# Patient Record
Sex: Female | Born: 1935 | Race: White | Hispanic: No | Smoking: Never smoker
Health system: Southern US, Community
[De-identification: ages and names within clinical notes are randomized; demographics above are authoritative.]

## PROBLEM LIST (undated history)

## (undated) DIAGNOSIS — A799 Rickettsiosis, unspecified: Secondary | ICD-10-CM

## (undated) DIAGNOSIS — M8580 Other specified disorders of bone density and structure, unspecified site: Secondary | ICD-10-CM

## (undated) DIAGNOSIS — E785 Hyperlipidemia, unspecified: Secondary | ICD-10-CM

## (undated) DIAGNOSIS — K219 Gastro-esophageal reflux disease without esophagitis: Secondary | ICD-10-CM

## (undated) DIAGNOSIS — I10 Essential (primary) hypertension: Secondary | ICD-10-CM

## (undated) DIAGNOSIS — C50919 Malignant neoplasm of unspecified site of unspecified female breast: Secondary | ICD-10-CM

## (undated) DIAGNOSIS — J45909 Unspecified asthma, uncomplicated: Secondary | ICD-10-CM

## (undated) DIAGNOSIS — C801 Malignant (primary) neoplasm, unspecified: Secondary | ICD-10-CM

## (undated) DIAGNOSIS — I480 Paroxysmal atrial fibrillation: Secondary | ICD-10-CM

## (undated) DIAGNOSIS — Z9289 Personal history of other medical treatment: Secondary | ICD-10-CM

## (undated) HISTORY — DX: Personal history of other medical treatment: Z92.89

## (undated) HISTORY — DX: Other specified disorders of bone density and structure, unspecified site: M85.80

## (undated) HISTORY — DX: Gastro-esophageal reflux disease without esophagitis: K21.9

## (undated) HISTORY — PX: BREAST BIOPSY: SHX20

## (undated) HISTORY — DX: Malignant (primary) neoplasm, unspecified: C80.1

## (undated) HISTORY — DX: Rickettsiosis, unspecified: A79.9

## (undated) HISTORY — DX: Unspecified asthma, uncomplicated: J45.909

## (undated) HISTORY — DX: Paroxysmal atrial fibrillation: I48.0

## (undated) HISTORY — DX: Hyperlipidemia, unspecified: E78.5

## (undated) HISTORY — DX: Malignant neoplasm of unspecified site of unspecified female breast: C50.919

## (undated) HISTORY — DX: Essential (primary) hypertension: I10

---

## 1978-01-06 HISTORY — PX: ABDOMINAL HYSTERECTOMY: SHX81

## 1981-01-06 HISTORY — PX: BLADDER SUSPENSION: SHX72

## 2004-01-11 ENCOUNTER — Ambulatory Visit: Payer: Self-pay | Admitting: Family Medicine

## 2005-01-13 ENCOUNTER — Ambulatory Visit: Payer: Self-pay | Admitting: Family Medicine

## 2006-01-15 ENCOUNTER — Ambulatory Visit: Payer: Self-pay | Admitting: Family Medicine

## 2007-01-19 ENCOUNTER — Ambulatory Visit: Payer: Self-pay | Admitting: Family Medicine

## 2008-01-07 DIAGNOSIS — C50919 Malignant neoplasm of unspecified site of unspecified female breast: Secondary | ICD-10-CM

## 2008-01-07 HISTORY — PX: MASTECTOMY: SHX3

## 2008-01-07 HISTORY — PX: BREAST SURGERY: SHX581

## 2008-01-07 HISTORY — DX: Malignant neoplasm of unspecified site of unspecified female breast: C50.919

## 2008-01-21 ENCOUNTER — Ambulatory Visit: Payer: Self-pay | Admitting: Family Medicine

## 2008-01-26 ENCOUNTER — Ambulatory Visit: Payer: Self-pay | Admitting: Family Medicine

## 2008-02-18 ENCOUNTER — Ambulatory Visit: Payer: Self-pay | Admitting: Surgery

## 2008-02-25 ENCOUNTER — Ambulatory Visit: Payer: Self-pay | Admitting: Surgery

## 2008-03-06 ENCOUNTER — Ambulatory Visit: Payer: Self-pay | Admitting: Internal Medicine

## 2008-03-08 ENCOUNTER — Ambulatory Visit: Payer: Self-pay | Admitting: Internal Medicine

## 2008-04-05 ENCOUNTER — Inpatient Hospital Stay: Payer: Self-pay | Admitting: Surgery

## 2008-04-06 ENCOUNTER — Ambulatory Visit: Payer: Self-pay | Admitting: Internal Medicine

## 2008-04-21 ENCOUNTER — Ambulatory Visit: Payer: Self-pay | Admitting: Internal Medicine

## 2008-05-06 ENCOUNTER — Ambulatory Visit: Payer: Self-pay | Admitting: Internal Medicine

## 2008-07-06 ENCOUNTER — Ambulatory Visit: Payer: Self-pay | Admitting: Internal Medicine

## 2008-07-21 ENCOUNTER — Ambulatory Visit: Payer: Self-pay | Admitting: Internal Medicine

## 2008-08-06 ENCOUNTER — Ambulatory Visit: Payer: Self-pay | Admitting: Internal Medicine

## 2008-10-06 ENCOUNTER — Ambulatory Visit: Payer: Self-pay | Admitting: Internal Medicine

## 2008-10-20 ENCOUNTER — Ambulatory Visit: Payer: Self-pay | Admitting: Internal Medicine

## 2008-11-06 ENCOUNTER — Ambulatory Visit: Payer: Self-pay | Admitting: Internal Medicine

## 2009-01-22 ENCOUNTER — Ambulatory Visit: Payer: Self-pay | Admitting: Family Medicine

## 2009-02-06 ENCOUNTER — Ambulatory Visit: Payer: Self-pay | Admitting: Internal Medicine

## 2009-02-20 ENCOUNTER — Ambulatory Visit: Payer: Self-pay | Admitting: Internal Medicine

## 2009-03-06 ENCOUNTER — Ambulatory Visit: Payer: Self-pay | Admitting: Internal Medicine

## 2009-07-06 ENCOUNTER — Ambulatory Visit: Payer: Self-pay | Admitting: Internal Medicine

## 2009-07-13 ENCOUNTER — Ambulatory Visit: Payer: Self-pay | Admitting: Internal Medicine

## 2009-08-05 ENCOUNTER — Observation Stay: Payer: Self-pay | Admitting: Internal Medicine

## 2009-08-06 ENCOUNTER — Ambulatory Visit: Payer: Self-pay | Admitting: Internal Medicine

## 2009-11-16 ENCOUNTER — Ambulatory Visit: Payer: Self-pay | Admitting: Internal Medicine

## 2009-12-06 ENCOUNTER — Ambulatory Visit: Payer: Self-pay | Admitting: Internal Medicine

## 2010-01-25 ENCOUNTER — Ambulatory Visit: Payer: Self-pay | Admitting: Internal Medicine

## 2010-03-22 ENCOUNTER — Ambulatory Visit: Payer: Self-pay | Admitting: Internal Medicine

## 2010-04-07 ENCOUNTER — Ambulatory Visit: Payer: Self-pay | Admitting: Internal Medicine

## 2010-09-23 ENCOUNTER — Ambulatory Visit: Payer: Self-pay | Admitting: Internal Medicine

## 2010-10-07 ENCOUNTER — Ambulatory Visit: Payer: Self-pay | Admitting: Internal Medicine

## 2010-12-05 HISTORY — PX: OTHER SURGICAL HISTORY: SHX169

## 2011-01-07 HISTORY — PX: COLONOSCOPY: SHX174

## 2011-01-27 ENCOUNTER — Ambulatory Visit: Payer: Self-pay | Admitting: Internal Medicine

## 2011-03-24 ENCOUNTER — Ambulatory Visit: Payer: Self-pay | Admitting: Internal Medicine

## 2011-03-24 LAB — HEPATIC FUNCTION PANEL A (ARMC)
Albumin: 4.2 g/dL (ref 3.4–5.0)
Alkaline Phosphatase: 106 U/L (ref 50–136)
Bilirubin, Direct: 0.1 mg/dL (ref 0.00–0.20)
Bilirubin,Total: 0.6 mg/dL (ref 0.2–1.0)
SGPT (ALT): 53 U/L
Total Protein: 8.3 g/dL — ABNORMAL HIGH (ref 6.4–8.2)

## 2011-03-24 LAB — CBC CANCER CENTER
Basophil #: 0 x10 3/mm (ref 0.0–0.1)
Eosinophil #: 0.2 x10 3/mm (ref 0.0–0.7)
Lymphocyte #: 2.3 x10 3/mm (ref 1.0–3.6)
Lymphocyte %: 25.2 %
MCH: 30.8 pg (ref 26.0–34.0)
MCHC: 34.6 g/dL (ref 32.0–36.0)
MCV: 89 fL (ref 80–100)
Monocyte #: 0.7 x10 3/mm (ref 0.0–0.7)
Monocyte %: 8.1 %
Neutrophil %: 64 %
Platelet: 315 x10 3/mm (ref 150–440)
RDW: 13.5 % (ref 11.5–14.5)
WBC: 9.2 x10 3/mm (ref 3.6–11.0)

## 2011-03-24 LAB — CREATININE, SERUM: Creatinine: 1.04 mg/dL (ref 0.60–1.30)

## 2011-04-07 ENCOUNTER — Ambulatory Visit: Payer: Self-pay | Admitting: Internal Medicine

## 2011-06-12 ENCOUNTER — Ambulatory Visit: Payer: Self-pay | Admitting: Gastroenterology

## 2011-06-12 DIAGNOSIS — K219 Gastro-esophageal reflux disease without esophagitis: Secondary | ICD-10-CM

## 2011-06-12 HISTORY — DX: Gastro-esophageal reflux disease without esophagitis: K21.9

## 2011-11-14 ENCOUNTER — Ambulatory Visit: Payer: Self-pay | Admitting: Ophthalmology

## 2011-11-14 LAB — CREATININE, SERUM
Creatinine: 0.76 mg/dL (ref 0.60–1.30)
EGFR (Non-African Amer.): >60

## 2012-01-07 DIAGNOSIS — Z9289 Personal history of other medical treatment: Secondary | ICD-10-CM

## 2012-01-07 HISTORY — DX: Personal history of other medical treatment: Z92.89

## 2012-01-29 ENCOUNTER — Ambulatory Visit: Payer: Self-pay | Admitting: Internal Medicine

## 2012-03-06 ENCOUNTER — Ambulatory Visit: Payer: Self-pay | Admitting: Internal Medicine

## 2012-03-30 LAB — CBC CANCER CENTER
Basophil %: 1 %
Eosinophil #: 0.2 x10 3/mm (ref 0.0–0.7)
HCT: 43.6 % (ref 35.0–47.0)
HGB: 15.2 g/dL (ref 12.0–16.0)
Lymphocyte #: 1.8 x10 3/mm (ref 1.0–3.6)
MCH: 30.7 pg (ref 26.0–34.0)
MCHC: 34.9 g/dL (ref 32.0–36.0)
Monocyte #: 1 x10 3/mm — ABNORMAL HIGH (ref 0.2–0.9)
Monocyte %: 9.9 %
Neutrophil #: 6.6 x10 3/mm — ABNORMAL HIGH (ref 1.4–6.5)
Platelet: 309 x10 3/mm (ref 150–440)
RDW: 13.5 % (ref 11.5–14.5)

## 2012-03-30 LAB — HEPATIC FUNCTION PANEL A (ARMC)
Albumin: 4.2 g/dL (ref 3.4–5.0)
Alkaline Phosphatase: 113 U/L (ref 50–136)
Bilirubin, Direct: 0.2 mg/dL (ref 0.00–0.20)
Bilirubin,Total: 0.6 mg/dL (ref 0.2–1.0)
SGPT (ALT): 45 U/L (ref 12–78)

## 2012-03-30 LAB — CREATININE, SERUM
Creatinine: 0.85 mg/dL (ref 0.60–1.30)
EGFR (Non-African Amer.): >60

## 2012-04-06 ENCOUNTER — Ambulatory Visit: Payer: Self-pay | Admitting: Internal Medicine

## 2013-01-31 ENCOUNTER — Ambulatory Visit: Payer: Self-pay | Admitting: Internal Medicine

## 2013-03-29 ENCOUNTER — Ambulatory Visit: Payer: Self-pay | Admitting: Internal Medicine

## 2013-03-30 LAB — CREATININE, SERUM
CREATININE: 0.84 mg/dL (ref 0.60–1.30)
EGFR (Non-African Amer.): >60

## 2013-03-30 LAB — CBC CANCER CENTER
Basophil #: 0.1 x10 3/mm (ref 0.0–0.1)
Basophil %: 0.8 %
EOS ABS: 0.3 x10 3/mm (ref 0.0–0.7)
Eosinophil %: 3.3 %
HCT: 43.5 % (ref 35.0–47.0)
HGB: 14.5 g/dL (ref 12.0–16.0)
LYMPHS ABS: 1.9 x10 3/mm (ref 1.0–3.6)
Lymphocyte %: 20.4 %
MCH: 29.5 pg (ref 26.0–34.0)
MCHC: 33.3 g/dL (ref 32.0–36.0)
MCV: 89 fL (ref 80–100)
Monocyte #: 0.9 x10 3/mm (ref 0.2–0.9)
Monocyte %: 9.7 %
Neutrophil #: 6.2 x10 3/mm (ref 1.4–6.5)
Neutrophil %: 65.8 %
Platelet: 293 x10 3/mm (ref 150–440)
RBC: 4.91 10*6/uL (ref 3.80–5.20)
RDW: 13.4 % (ref 11.5–14.5)
WBC: 9.4 x10 3/mm (ref 3.6–11.0)

## 2013-03-30 LAB — HEPATIC FUNCTION PANEL A (ARMC)
ALBUMIN: 4.1 g/dL (ref 3.4–5.0)
Alkaline Phosphatase: 95 U/L
BILIRUBIN DIRECT: 0.1 mg/dL (ref 0.00–0.20)
BILIRUBIN TOTAL: 0.6 mg/dL (ref 0.2–1.0)
SGOT(AST): 29 U/L (ref 15–37)
SGPT (ALT): 28 U/L (ref 12–78)
TOTAL PROTEIN: 8.1 g/dL (ref 6.4–8.2)

## 2013-04-06 ENCOUNTER — Ambulatory Visit: Payer: Self-pay | Admitting: Internal Medicine

## 2013-04-06 ENCOUNTER — Encounter: Payer: Medicare Other | Admitting: Cardiothoracic Surgery

## 2013-04-14 ENCOUNTER — Ambulatory Visit: Payer: Self-pay | Admitting: Family Medicine

## 2013-05-09 ENCOUNTER — Encounter: Payer: Self-pay | Admitting: *Deleted

## 2013-05-09 DIAGNOSIS — K219 Gastro-esophageal reflux disease without esophagitis: Secondary | ICD-10-CM | POA: Insufficient documentation

## 2013-05-09 DIAGNOSIS — M8580 Other specified disorders of bone density and structure, unspecified site: Secondary | ICD-10-CM | POA: Insufficient documentation

## 2013-05-09 DIAGNOSIS — A799 Rickettsiosis, unspecified: Secondary | ICD-10-CM | POA: Insufficient documentation

## 2013-05-09 DIAGNOSIS — C801 Malignant (primary) neoplasm, unspecified: Secondary | ICD-10-CM | POA: Insufficient documentation

## 2013-05-09 DIAGNOSIS — E785 Hyperlipidemia, unspecified: Secondary | ICD-10-CM | POA: Insufficient documentation

## 2013-05-09 DIAGNOSIS — I10 Essential (primary) hypertension: Secondary | ICD-10-CM | POA: Insufficient documentation

## 2013-05-09 DIAGNOSIS — C50919 Malignant neoplasm of unspecified site of unspecified female breast: Secondary | ICD-10-CM | POA: Insufficient documentation

## 2013-05-09 DIAGNOSIS — I480 Paroxysmal atrial fibrillation: Secondary | ICD-10-CM | POA: Insufficient documentation

## 2013-05-09 DIAGNOSIS — J45909 Unspecified asthma, uncomplicated: Secondary | ICD-10-CM | POA: Insufficient documentation

## 2013-05-10 ENCOUNTER — Institutional Professional Consult (permissible substitution) (INDEPENDENT_AMBULATORY_CARE_PROVIDER_SITE_OTHER): Payer: Medicare Other | Admitting: Thoracic Surgery (Cardiothoracic Vascular Surgery)

## 2013-05-10 ENCOUNTER — Encounter: Payer: Self-pay | Admitting: Thoracic Surgery (Cardiothoracic Vascular Surgery)

## 2013-05-10 VITALS — BP 143/88 | HR 64 | Resp 16 | Ht 61.0 in | Wt 115.0 lb

## 2013-05-10 DIAGNOSIS — I340 Nonrheumatic mitral (valve) insufficiency: Secondary | ICD-10-CM

## 2013-05-10 DIAGNOSIS — I059 Rheumatic mitral valve disease, unspecified: Secondary | ICD-10-CM

## 2013-05-10 DIAGNOSIS — I071 Rheumatic tricuspid insufficiency: Secondary | ICD-10-CM | POA: Insufficient documentation

## 2013-05-10 DIAGNOSIS — I079 Rheumatic tricuspid valve disease, unspecified: Secondary | ICD-10-CM

## 2013-05-10 NOTE — Progress Notes (Signed)
PCP is [name], MD Referring Provider is [name], MD CARDIOLOGIST: [name], MD  Chief Complaint  Patient presents with  . Mitral Regurgitation    Surgical eval, ECHO 04/14/2013  . Tricuspid Regurgitation    HPI: 79 year old woman who presents with chief complaint of shortness of breath with exertion.  Laura David is a 79 year old woman with a history of mitral regurgitation and atrial fibrillation. She says that recently she's been having more difficulty with shortness of breath. She will get short of breath when she walks about a block on level ground. She also gets short of breath with a flight of stairs. She really has difficulty walking up inclines.She really does not have any chest pain but does have palpitations associated with her atrial fibrillation. She says she had one episode of profound dizziness about 2 and half years ago for which she was hospitalized, but no cause was never determined.  She had an echocardiogram in April that showed normal left ventricular function there was a dilated left atrium with moderate to severe mitral regurgitation and mild to moderate tricuspid regurgitation.  She saw Dr. Nehemiah Massed and asked him about whether there is anything that could be done and she interpreted his answer as saying there was nothing more that can be done for her. She is very concerned about this, because she had an older brother who recently died He apparently had aortic stenosis and his doctors had told her that there was nothing more that could be done and he was not a surgical candidate.  Past Medical History  Diagnosis Date  . Hypertension   . Hyperlipidemia   . PAF (paroxysmal atrial fibrillation)   . Osteopenia   . Asthma   . 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 GERD (gastroesophageal reflux disease) 06/12/11    EGD NORMAL  . Cancer     basel cell, s/p MOH'S/DR. DASHER  . Breast cancer 2010    RIGHT  . Hx of mammogram 2014    NORMAL     Past Surgical History  Procedure Laterality Date  . Abdominal hysterectomy  1980    D/T FIBROIDS  . Bladder suspension  1983  . Breast surgery Right 2010    MASTECTOMY/[facility]  . Moles removed  12/05/10    FROM [facility]  . Colonoscopy  2013    NORMAL    Family History  Problem Relation Age of Onset  . Heart disease Mother   . Stroke Mother   . Cancer Father     LUNG  . Diabetes Brother   . Heart disease Brother   . Hyperlipidemia Brother   . Hypertension Brother   . Diabetes Brother   . Heart disease Brother   . Hyperlipidemia Brother   . Hypertension Brother     Social History History  Substance Use Topics  . Smoking status: Never Smoker   . Smokeless tobacco: Not on file  . Alcohol Use: No    Current Outpatient Prescriptions  Medication Sig Dispense Refill  . amLODipine (NORVASC) 5 MG tablet Take 5 mg by mouth daily.      . digoxin (LANOXIN) 0.125 MG tablet Take 0.125 mg by mouth daily.      Marland Kitchen letrozole (FEMARA) 2.5 MG tablet Take 2.5 mg by mouth daily.      . metoprolol (LOPRESSOR) 100 MG tablet Take 50 mg by mouth daily.      Marland Kitchen omeprazole (PRILOSEC OTC) 20 MG tablet Take 20 mg by  mouth 2 (two) times daily.      . simvastatin (ZOCOR) 10 MG tablet Take 10 mg by mouth daily.      Marland Kitchen warfarin (COUMADIN) 2 MG tablet Take 2 mg by mouth daily. TAKE AS DIRECTED.       No current facility-administered medications for this visit.    Allergies  Allergen Reactions  . Flagyl [Metronidazole] Nausea Only  . Pacerone [Amiodarone]     Eye deposits  . Ramipril Cough    Review of Systems  Constitutional: Positive for activity change ( somme limitation  duue to shortness of breath). Negative for fever, chills and unexpected weight change.  Respiratory: Positive for shortness of breath (With exertion). Negative for wheezing.   Cardiovascular: Positive for leg swelling (At the end of the day when she is up on her feet a lot). Negative for chest pain.       No orthopnea or  PND, + atrial fib  Gastrointestinal:       Reflux  Genitourinary: Positive for hematuria.  All other systems reviewed and are negative.   BP 143/88  Pulse 64  Resp 16  Ht 5\' 1"  (1.549 m)  Wt 115 lb (52.164 kg)  BMI 21.74 kg/m2  SpO2 98% Physical Exam  Vitals reviewed. Constitutional: She is oriented to person, place, and time. She appears well-developed and well-nourished. No distress.  HENT:  Head: Normocephalic and atraumatic.  Eyes: EOM are normal. Pupils are equal, round, and reactive to light.  Neck: Neck supple. No thyromegaly present.  Cardiovascular: Normal rate, regular rhythm and intact distal pulses.   Murmur (Questionable faint systolic murmur) heard. Pulmonary/Chest: Breath sounds normal. She has no wheezes. She has no rales.  Abdominal: Soft. There is no tenderness.  Musculoskeletal: She exhibits no edema.  Lymphadenopathy:    She has no cervical adenopathy.  Neurological: She is alert and oriented to person, place, and time. No cranial nerve deficit.  No motor deficit  Skin: Skin is warm and dry.     Diagnostic Tests: Echocardiogram 04/14/2013 Summary: 1. Left ventricular ejection fraction by visual estimation is 50-55% 2. Normal global left ventricular systolic function 3. Moderately dilated left atrium 4. Mildly dilated right atrium 5. Moderate to severe mitral valve regurgitation 6. Mild to moderate tricuspid regurgitation 7. Mildly increased left ventricular posterior wall thickness  Comparison echo 04/19/2012 Conclusion 1. Normal LV systolic function. EF 55% 2 severe biatrial enlargement 3. Severe mitral and tricuspid and mild aortic insufficiency  Impression: 79 year old woman with chronic atrial fibrillation and moderately severe mitral regurgitation. I reviewed her echocardiogram and the Doppler images are not particularly impressive. Her peak E. was 1.02 m/s, which is below the threshold for severe of 1.2. She does have a large atria and is in  chronic atrial fibrillation her left ventricular function is normal and her LV is not dilated.  I think the biggest concern that Laura David had was her understanding that there was nothing that could be done for her. This concerned her greatly as her Brother recently died from valvular heart disease and was not an operative candidate. I don't think that's what Dr. Nehemiah Massed was trying to communicate to her at all. I think he was telling her that she did not need surgery at this time, not that surgery was impossible. I do think she would be a good surgical candidate, although I tend to agree that there is no compelling reason to proceed with surgery at this time.  Dr. Nehemiah Massed recommended a repeat  echocardiogram in 6 months. I would be in favor of that as long as her symptoms are reasonably managed medically. If there is any question as to the severity of the mitral and tricuspid regurgitation at that time, a TEE would be helpful.  I will be happy to see her back at any time if she would like to discuss these issues further  Plan:  Continue medical therapy  Repeat echo in 6 months

## 2014-02-07 ENCOUNTER — Ambulatory Visit: Payer: Self-pay | Admitting: Internal Medicine

## 2014-04-10 ENCOUNTER — Ambulatory Visit (INDEPENDENT_AMBULATORY_CARE_PROVIDER_SITE_OTHER): Payer: Medicare Other

## 2014-04-10 ENCOUNTER — Encounter: Payer: Self-pay | Admitting: Podiatry

## 2014-04-10 ENCOUNTER — Ambulatory Visit (INDEPENDENT_AMBULATORY_CARE_PROVIDER_SITE_OTHER): Payer: Medicare Other | Admitting: Podiatry

## 2014-04-10 VITALS — BP 143/85 | HR 54 | Resp 16

## 2014-04-10 DIAGNOSIS — S99921A Unspecified injury of right foot, initial encounter: Secondary | ICD-10-CM

## 2014-04-10 DIAGNOSIS — L600 Ingrowing nail: Secondary | ICD-10-CM

## 2014-04-10 MED ORDER — CEPHALEXIN 500 MG PO CAPS: 500.0000 mg | ORAL_CAPSULE | Freq: Three times a day (TID) | ORAL | Status: DC

## 2014-04-10 MED ORDER — NEOMYCIN-POLYMYXIN-HC 3.5-10000-1 OT SOLN: OTIC | Status: DC

## 2014-04-10 NOTE — Patient Instructions (Signed)

## 2014-04-10 NOTE — Progress Notes (Signed)
   Subjective:    Patient ID: Laura David, female    DOB: 05-24-1934, 80 y.o.   MRN: [id number]  HPI Comments: "I stumped this toe"  Patient c/o tender 1st toe right, lateral border, for 4-5 weeks. She stumped the toe. The area is red and swollen. Some draining. She has developed a corn on the lateral side of the toe since the injury. She has tried to trimmed it back. Soaked it some at night. Not getting better.  Toe Pain       Review of Systems  Eyes: Positive for visual disturbance.  Respiratory: Positive for shortness of breath.   Cardiovascular: Positive for palpitations.  All other systems reviewed and are negative.      Objective:   Physical Exam: I have reviewed her past medical history medications allergy surgery social history and review of systems. Pulses are strongly palpable bilateral. Neurologic sensorium is intact per Semmes-Weinstein monofilament. Deep tendon reflexes are intact bilateral and muscle strength +5 over 5 dorsiflexion plantar flexors and inverters and everters all intrinsic musculature is intact. Orthopedic evaluation demonstrates all joints distal to the ankle range of motion without crepitation. Moderate to severe hallux abductovalgus deformities bilateral and hammertoe deformities bilateral. Cutaneous evaluation demonstrates sharply incurvated nail margin along the fibular border of the hallux right with gross granulation tissue purulence and malodor.        Assessment & Plan:  Assessment: Ingrown toenails hallux right.  Plan: Discussed etiology pathology conservative versus surgical therapies. Chemical matrixectomy was performed to the fibular border today after local anesthetic was administered. She tolerated the procedure well. She was given both oral and written home-going instructions for the care of her foot as well as a prescription for medication. Follow up with her in 1 week.

## 2014-04-17 ENCOUNTER — Ambulatory Visit: Payer: Medicare Other | Admitting: Podiatry

## 2014-04-19 ENCOUNTER — Encounter: Payer: Self-pay | Admitting: Podiatry

## 2014-04-19 ENCOUNTER — Ambulatory Visit (INDEPENDENT_AMBULATORY_CARE_PROVIDER_SITE_OTHER): Payer: Medicare Other | Admitting: Podiatry

## 2014-04-19 DIAGNOSIS — L600 Ingrowing nail: Secondary | ICD-10-CM

## 2014-04-19 NOTE — Progress Notes (Signed)
She presents today 1 week status post matrixectomy fibular border hallux right. She states there is still quite tender and she's been soaking in Betadine and warm water. She continues to apply Cortisporin otic and cover twice daily.  Objective: Vital signs are stable she is alert and oriented 3. Mild erythema and no edema saline as drainage or odor. Eschar is present overlying the fibular border of the hallux nail plate right.  Assessment: Well-healing surgical foot right.  Plan: Is continue Betadine start with Epsom salts and warm water soaks covered during the daytime and leave open at night.

## 2014-10-24 ENCOUNTER — Other Ambulatory Visit: Payer: Self-pay | Admitting: Family Medicine

## 2014-10-24 ENCOUNTER — Other Ambulatory Visit: Payer: Self-pay | Admitting: Internal Medicine

## 2014-10-24 DIAGNOSIS — Z1231 Encounter for screening mammogram for malignant neoplasm of breast: Secondary | ICD-10-CM

## 2015-02-09 ENCOUNTER — Ambulatory Visit
Admission: RE | Admit: 2015-02-09 | Discharge: 2015-02-09 | Disposition: A | Discharge status: Home / Self Care | Payer: Medicare Other | Source: Ambulatory Visit | Attending: Family Medicine | Admitting: Family Medicine

## 2015-02-09 ENCOUNTER — Other Ambulatory Visit: Payer: Self-pay | Admitting: Family Medicine

## 2015-02-09 DIAGNOSIS — Z1231 Encounter for screening mammogram for malignant neoplasm of breast: Secondary | ICD-10-CM

## 2016-01-11 ENCOUNTER — Other Ambulatory Visit: Payer: Self-pay | Admitting: Family Medicine

## 2016-01-11 DIAGNOSIS — Z1231 Encounter for screening mammogram for malignant neoplasm of breast: Secondary | ICD-10-CM

## 2016-02-14 ENCOUNTER — Ambulatory Visit: Payer: Medicare Other

## 2016-02-18 ENCOUNTER — Ambulatory Visit
Admission: RE | Admit: 2016-02-18 | Discharge: 2016-02-18 | Disposition: A | Discharge status: Home / Self Care | Payer: Medicare Other | Source: Ambulatory Visit | Attending: Family Medicine | Admitting: Family Medicine

## 2016-02-18 DIAGNOSIS — Z1231 Encounter for screening mammogram for malignant neoplasm of breast: Secondary | ICD-10-CM | POA: Diagnosis present

## 2017-01-14 ENCOUNTER — Other Ambulatory Visit: Payer: Self-pay | Admitting: Family Medicine

## 2017-01-14 DIAGNOSIS — Z1231 Encounter for screening mammogram for malignant neoplasm of breast: Secondary | ICD-10-CM

## 2017-02-18 ENCOUNTER — Ambulatory Visit
Admission: RE | Admit: 2017-02-18 | Discharge: 2017-02-18 | Disposition: A | Discharge status: Home / Self Care | Payer: Medicare Other | Source: Ambulatory Visit | Attending: Family Medicine | Admitting: Family Medicine

## 2017-02-18 ENCOUNTER — Other Ambulatory Visit: Payer: Self-pay | Admitting: Family Medicine

## 2017-02-18 DIAGNOSIS — Z1231 Encounter for screening mammogram for malignant neoplasm of breast: Secondary | ICD-10-CM

## 2018-03-02 ENCOUNTER — Other Ambulatory Visit: Payer: Self-pay | Admitting: Family Medicine

## 2018-03-02 DIAGNOSIS — Z1231 Encounter for screening mammogram for malignant neoplasm of breast: Secondary | ICD-10-CM

## 2018-03-12 ENCOUNTER — Ambulatory Visit
Admission: RE | Admit: 2018-03-12 | Discharge: 2018-03-12 | Disposition: A | Discharge status: Home / Self Care | Payer: Medicare Other | Source: Ambulatory Visit | Attending: Family Medicine | Admitting: Family Medicine

## 2018-03-12 DIAGNOSIS — Z1231 Encounter for screening mammogram for malignant neoplasm of breast: Secondary | ICD-10-CM | POA: Insufficient documentation

## 2018-10-18 ENCOUNTER — Encounter: Payer: Self-pay | Admitting: Emergency Medicine

## 2018-10-18 ENCOUNTER — Other Ambulatory Visit: Payer: Self-pay

## 2018-10-18 ENCOUNTER — Emergency Department: Payer: Medicare Other

## 2018-10-18 ENCOUNTER — Emergency Department
Admission: EM | Admit: 2018-10-18 | Discharge: 2018-10-18 | Disposition: A | Discharge status: Home / Self Care | Payer: Medicare Other | Attending: Student | Admitting: Student

## 2018-10-18 DIAGNOSIS — I10 Essential (primary) hypertension: Secondary | ICD-10-CM | POA: Insufficient documentation

## 2018-10-18 DIAGNOSIS — J45909 Unspecified asthma, uncomplicated: Secondary | ICD-10-CM | POA: Insufficient documentation

## 2018-10-18 DIAGNOSIS — Z7901 Long term (current) use of anticoagulants: Secondary | ICD-10-CM | POA: Diagnosis not present

## 2018-10-18 DIAGNOSIS — Z853 Personal history of malignant neoplasm of breast: Secondary | ICD-10-CM | POA: Insufficient documentation

## 2018-10-18 DIAGNOSIS — I480 Paroxysmal atrial fibrillation: Secondary | ICD-10-CM | POA: Diagnosis not present

## 2018-10-18 DIAGNOSIS — Z79899 Other long term (current) drug therapy: Secondary | ICD-10-CM | POA: Insufficient documentation

## 2018-10-18 DIAGNOSIS — R42 Dizziness and giddiness: Secondary | ICD-10-CM | POA: Diagnosis present

## 2018-10-18 LAB — COMPREHENSIVE METABOLIC PANEL
ALT: 22 U/L (ref 0–44)
AST: 29 U/L (ref 15–41)
Albumin: 4.6 g/dL (ref 3.5–5.0)
Alkaline Phosphatase: 79 U/L (ref 38–126)
Anion gap: 10 (ref 5–15)
BUN: 15 mg/dL (ref 8–23)
CO2: 28 mmol/L (ref 22–32)
Calcium: 9.9 mg/dL (ref 8.9–10.3)
Chloride: 98 mmol/L (ref 98–111)
Creatinine, Ser: 0.69 mg/dL (ref 0.44–1.00)
GFR calc Af Amer: >60 mL/min (ref >60)
GFR calc non Af Amer: >60 mL/min (ref >60)
Glucose, Bld: 118 mg/dL — ABNORMAL HIGH (ref 70–99)
Potassium: 3.8 mmol/L (ref 3.5–5.1)
Sodium: 136 mmol/L (ref 135–145)
Total Bilirubin: 1.1 mg/dL (ref 0.3–1.2)
Total Protein: 8 g/dL (ref 6.5–8.1)

## 2018-10-18 LAB — CBC
HCT: 43.5 % (ref 36.0–46.0)
Hemoglobin: 14.6 g/dL (ref 12.0–15.0)
MCH: 30.4 pg (ref 26.0–34.0)
MCHC: 33.6 g/dL (ref 30.0–36.0)
MCV: 90.6 fL (ref 80.0–100.0)
Platelets: 272 10*3/uL (ref 150–400)
RBC: 4.8 MIL/uL (ref 3.87–5.11)
RDW: 12.8 % (ref 11.5–15.5)
WBC: 8.8 10*3/uL (ref 4.0–10.5)
nRBC: 0 % (ref 0.0–0.2)

## 2018-10-18 LAB — URINALYSIS, COMPLETE (UACMP) WITH MICROSCOPIC
Bacteria, UA: NONE SEEN
Bilirubin Urine: NEGATIVE
Glucose, UA: NEGATIVE mg/dL
Ketones, ur: NEGATIVE mg/dL
Leukocytes,Ua: NEGATIVE
Nitrite: NEGATIVE
Protein, ur: NEGATIVE mg/dL
Specific Gravity, Urine: 1.008 (ref 1.005–1.030)
pH: 7 (ref 5.0–8.0)

## 2018-10-18 MED ORDER — MECLIZINE HCL 25 MG PO TABS: 25.0000 mg | ORAL_TABLET | Freq: Three times a day (TID) | ORAL | 0 refills | Status: DC | PRN

## 2018-10-18 MED ORDER — MECLIZINE HCL 25 MG PO TABS
25.0000 mg | ORAL_TABLET | Freq: Once | ORAL | Status: AC
  Administered 2018-10-18: 10:00:00 25 mg via ORAL
  Filled 2018-10-18: qty 1

## 2018-10-18 NOTE — ED Notes (Signed)
Pt in bed, no distress noted. Bed locked and low, oriented to room and call bell.

## 2018-10-18 NOTE — ED Notes (Signed)
Assisted patient to bathroom, pt with steady gait, denies dizziness when changing positions or walking.  Urine sample obtained and sent to lab.

## 2018-10-18 NOTE — ED Notes (Signed)
Pt states turning her head side to side makes her very dizzy.

## 2018-10-18 NOTE — ED Notes (Signed)
Care handoff: report given to Leonides Schanz, RN

## 2018-10-18 NOTE — ED Provider Notes (Signed)
[facility] Emergency Department Provider Note  ____________________________________________   First MD Initiated Contact with Patient 10/18/18 [phone]     (approximate)  I have reviewed the triage vital signs and the nursing notes.  History  Chief Complaint Dizziness    HPI Laura David is a 84 y.o. female hx as below who presents for dizziness. She states she has been diagnosed with peripheral vertigo previously, but has not had an episode for some time. Over the last week or so she has had an episode almost daily. Usually in the mornings. She describes severe dizziness/spinning sensation, especially worsened with movement of her head from side to side. Some associated nausea. No vomiting. No headache, visual changes, weakness, numbness, tingling. She has not tried anything for this. No recent illnesses.    Past Medical Hx Past Medical History:  Diagnosis Date  . Asthma   . Breast cancer ([place]) 2010   RIGHT  . Cancer ([facility])    basel cell, s/p MOH'S/DR. DASHER  . GERD (gastroesophageal reflux disease) 06/12/11   EGD NORMAL  . Hx of mammogram 2014   NORMAL  . Hyperlipidemia   . Hypertension   . Osteopenia   . PAF (paroxysmal atrial fibrillation) ([place])   . 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 Mitral regurgitation 05/10/2013  . Tricuspid regurgitation 05/10/2013  . Hypertension   . Hyperlipidemia   . PAF (paroxysmal atrial fibrillation) ([place])   . Osteopenia   . Cancer ([place])   . Asthma   . GERD (gastroesophageal reflux disease)   . Rickettsial disease   . Breast cancer [facility])     Past Surgical Hx Past Surgical History:  Procedure Laterality Date  . ABDOMINAL HYSTERECTOMY  1980   D/T FIBROIDS  . BLADDER SUSPENSION  1983  . BREAST SURGERY Right 2010   MASTECTOMY/[place]  . COLONOSCOPY  2013   NORMAL  . MASTECTOMY Right 2010  . MOLES REMOVED  12/05/10   FROM [facility]     Medications Prior to Admission medications   Medication Sig Start Date End Date Taking? Authorizing Provider  amLODipine (NORVASC) 5 MG tablet Take 5 mg by mouth daily.    [provider]  cephALEXin (KEFLEX) 500 MG capsule Take 1 capsule (500 mg total) by mouth 3 (three) times daily. 04/10/14   Hyatt, Max T, DPM  digoxin (LANOXIN) 0.125 MG tablet Take 0.125 mg by mouth daily.    [provider]  metoprolol (LOPRESSOR) 100 MG tablet Take 50 mg by mouth daily.    [provider]  neomycin-polymyxin-hydrocortisone (CORTISPORIN) otic solution Apply one to two drops to toe after soaking twice daily. 04/10/14   Hyatt, Max T, DPM  simvastatin (ZOCOR) 10 MG tablet Take 10 mg by mouth daily.    [provider]  warfarin (COUMADIN) 2 MG tablet Take 2 mg by mouth daily. TAKE AS DIRECTED.    [provider]    Allergies Flagyl [metronidazole], Pacerone [amiodarone], and Ramipril  Family Hx Family History  Problem Relation Age of Onset  . Heart disease Mother   . Stroke Mother   . Cancer Father        LUNG  . Diabetes Brother   . Heart disease Brother   . Hyperlipidemia Brother   . Hypertension Brother   . Diabetes Brother   . Heart disease Brother   . Hyperlipidemia Brother   .  Hypertension Brother   . Breast cancer Neg Hx     Social Hx Social History   Tobacco Use  . Smoking status: Never Smoker  Substance Use Topics  . Alcohol use: No  . Drug use: Not on file     Review of Systems  Constitutional: Negative for fever, chills. Eyes: Negative for visual changes. ENT: Negative for sore throat. Cardiovascular: Negative for chest pain. Respiratory: Negative for shortness of breath. Gastrointestinal: Negative for nausea, vomiting.  Genitourinary: Negative for dysuria. Musculoskeletal: Negative for leg swelling. Skin: Negative for rash. Neurological: + dizziness   Physical Exam  Vital Signs: ED Triage Vitals [10/18/18 0853]   Enc Vitals Group     BP (!) 146/74     Pulse Rate 66     Resp 16     Temp 97.9 F (36.6 C)     Temp Source Oral     SpO2 98 %     Weight 110 lb 3.7 oz (50 kg)     Height 5' (1.524 m)     Head Circumference      Peak Flow      Pain Score 0     Pain Loc      Pain Edu?      Excl. in [place]?     Constitutional: Alert and oriented.  Head: Normocephalic. Atraumatic. Eyes: Conjunctivae clear. Sclera anicteric. No nystagmus. Nose: No congestion. No rhinorrhea. Mouth/Throat: Mucous membranes are moist.  Neck: No stridor.  No carotid bruits.  Cardiovascular: Irregular, normal rate. Extremities well perfused. Respiratory: Normal respiratory effort.  Lungs CTAB. Gastrointestinal: Soft. Non-tender. Non-distended.  Musculoskeletal: No lower extremity edema. No deformities. Neurologic:  Normal speech and language. No gross focal neurologic deficits are appreciated. Equal and symmetric strength 5/5 x 4. SILT x 4.  Skin: Skin is warm, dry and intact. No rash noted. Psychiatric: Mood and affect are appropriate for situation.  EKG  Personally reviewed.   Rate: 70s Rhythm: atrial fibrillation Axis: LAD Intervals: WNL AF, rate controlled, LAD No acute ischemic changes No STEMI    Radiology  XR: IMPRESSION:  Atrophy, chronic microvascular disease.   No acute intracranial abnormality.    Procedures  Procedure(s) performed (including critical care):  Procedures   Initial Impression / Assessment and Plan / ED Course  84 y.o. female who presents to the ED for dizziness, as above.  Presentation seems most consistent with peripheral vertigo. No preceding illness to suggest labyrinthitis. No other neurological symptoms. Unlikely to be symptomatic AF given this is chronic for her and she is adequately controlled. No carotid bruits on exam.   Will obtain labs, imaging, treat and reassess.  Work up w/o actionable derangements - electrolytes WNL, no UTI, negative CT. She reports  feeling much improved with meclizine. Will plan for discharge with Rx for meclizine and information for Epley maneuver for patient to attempt at home. Advised outpatient follow up. Patient agreeable. Given return precautions.     Final Clinical Impression(s) / ED Diagnosis  Final diagnoses:  Dizziness       Note:  This document was prepared using Dragon voice recognition software and may include unintentional dictation errors.   Lilia Pro., MD 10/18/18 [phone]

## 2018-10-18 NOTE — ED Triage Notes (Signed)
Patient reports intermittent dizziness since last Wednesday. Patient states she gets dizzy with any change in position. Also reports some nausea with position change. Patient reports previous diagnosis of vertigo but states she has never taken medicine for it.

## 2018-10-18 NOTE — ED Notes (Signed)
Pt made aware to call when she has to urinate, as we are needing a specimen. Bed locked/low. No distress.

## 2019-01-25 ENCOUNTER — Other Ambulatory Visit: Payer: Self-pay | Admitting: Family Medicine

## 2019-01-25 DIAGNOSIS — Z1231 Encounter for screening mammogram for malignant neoplasm of breast: Secondary | ICD-10-CM

## 2019-03-14 ENCOUNTER — Ambulatory Visit
Admission: RE | Admit: 2019-03-14 | Discharge: 2019-03-14 | Disposition: A | Discharge status: Home / Self Care | Payer: Medicare Other | Source: Ambulatory Visit | Attending: Family Medicine | Admitting: Family Medicine

## 2019-03-14 DIAGNOSIS — Z1231 Encounter for screening mammogram for malignant neoplasm of breast: Secondary | ICD-10-CM | POA: Insufficient documentation

## 2020-01-09 ENCOUNTER — Emergency Department: Payer: Medicare Other

## 2020-01-09 ENCOUNTER — Other Ambulatory Visit: Payer: Self-pay

## 2020-01-09 ENCOUNTER — Inpatient Hospital Stay
Admission: EM | Admit: 2020-01-09 | Discharge: 2020-01-17 | DRG: 510 | Disposition: A | Discharge status: Home / Self Care | Payer: Medicare Other | Attending: Internal Medicine | Admitting: Internal Medicine

## 2020-01-09 DIAGNOSIS — Z20822 Contact with and (suspected) exposure to covid-19: Secondary | ICD-10-CM | POA: Diagnosis present

## 2020-01-09 DIAGNOSIS — Z79899 Other long term (current) drug therapy: Secondary | ICD-10-CM

## 2020-01-09 DIAGNOSIS — Z833 Family history of diabetes mellitus: Secondary | ICD-10-CM

## 2020-01-09 DIAGNOSIS — W19XXXA Unspecified fall, initial encounter: Secondary | ICD-10-CM | POA: Diagnosis not present

## 2020-01-09 DIAGNOSIS — E876 Hypokalemia: Secondary | ICD-10-CM | POA: Diagnosis present

## 2020-01-09 DIAGNOSIS — J45909 Unspecified asthma, uncomplicated: Secondary | ICD-10-CM | POA: Diagnosis present

## 2020-01-09 DIAGNOSIS — D72829 Elevated white blood cell count, unspecified: Secondary | ICD-10-CM | POA: Diagnosis present

## 2020-01-09 DIAGNOSIS — S52572A Other intraarticular fracture of lower end of left radius, initial encounter for closed fracture: Secondary | ICD-10-CM | POA: Diagnosis not present

## 2020-01-09 DIAGNOSIS — S42202A Unspecified fracture of upper end of left humerus, initial encounter for closed fracture: Secondary | ICD-10-CM

## 2020-01-09 DIAGNOSIS — D6832 Hemorrhagic disorder due to extrinsic circulating anticoagulants: Secondary | ICD-10-CM | POA: Diagnosis present

## 2020-01-09 DIAGNOSIS — D689 Coagulation defect, unspecified: Secondary | ICD-10-CM | POA: Diagnosis present

## 2020-01-09 DIAGNOSIS — Z823 Family history of stroke: Secondary | ICD-10-CM

## 2020-01-09 DIAGNOSIS — Z801 Family history of malignant neoplasm of trachea, bronchus and lung: Secondary | ICD-10-CM

## 2020-01-09 DIAGNOSIS — Y92512 Supermarket, store or market as the place of occurrence of the external cause: Secondary | ICD-10-CM

## 2020-01-09 DIAGNOSIS — Z85828 Personal history of other malignant neoplasm of skin: Secondary | ICD-10-CM

## 2020-01-09 DIAGNOSIS — Z7901 Long term (current) use of anticoagulants: Secondary | ICD-10-CM

## 2020-01-09 DIAGNOSIS — S42352A Displaced comminuted fracture of shaft of humerus, left arm, initial encounter for closed fracture: Secondary | ICD-10-CM | POA: Diagnosis not present

## 2020-01-09 DIAGNOSIS — K219 Gastro-esophageal reflux disease without esophagitis: Secondary | ICD-10-CM | POA: Diagnosis present

## 2020-01-09 DIAGNOSIS — W109XXA Fall (on) (from) unspecified stairs and steps, initial encounter: Secondary | ICD-10-CM | POA: Diagnosis present

## 2020-01-09 DIAGNOSIS — Z8249 Family history of ischemic heart disease and other diseases of the circulatory system: Secondary | ICD-10-CM

## 2020-01-09 DIAGNOSIS — E785 Hyperlipidemia, unspecified: Secondary | ICD-10-CM | POA: Diagnosis present

## 2020-01-09 DIAGNOSIS — Z853 Personal history of malignant neoplasm of breast: Secondary | ICD-10-CM

## 2020-01-09 DIAGNOSIS — D72825 Bandemia: Secondary | ICD-10-CM

## 2020-01-09 DIAGNOSIS — R079 Chest pain, unspecified: Secondary | ICD-10-CM

## 2020-01-09 DIAGNOSIS — T45515A Adverse effect of anticoagulants, initial encounter: Secondary | ICD-10-CM | POA: Diagnosis present

## 2020-01-09 DIAGNOSIS — S065X9A Traumatic subdural hemorrhage with loss of consciousness of unspecified duration, initial encounter: Secondary | ICD-10-CM | POA: Diagnosis not present

## 2020-01-09 DIAGNOSIS — M8580 Other specified disorders of bone density and structure, unspecified site: Secondary | ICD-10-CM | POA: Diagnosis present

## 2020-01-09 DIAGNOSIS — I10 Essential (primary) hypertension: Secondary | ICD-10-CM | POA: Diagnosis present

## 2020-01-09 DIAGNOSIS — S42412A Displaced simple supracondylar fracture without intercondylar fracture of left humerus, initial encounter for closed fracture: Secondary | ICD-10-CM | POA: Diagnosis present

## 2020-01-09 DIAGNOSIS — Y92009 Unspecified place in unspecified non-institutional (private) residence as the place of occurrence of the external cause: Secondary | ICD-10-CM

## 2020-01-09 DIAGNOSIS — Z8781 Personal history of (healed) traumatic fracture: Secondary | ICD-10-CM

## 2020-01-09 DIAGNOSIS — C50919 Malignant neoplasm of unspecified site of unspecified female breast: Secondary | ICD-10-CM | POA: Diagnosis present

## 2020-01-09 DIAGNOSIS — S065XAA Traumatic subdural hemorrhage with loss of consciousness status unknown, initial encounter: Secondary | ICD-10-CM

## 2020-01-09 DIAGNOSIS — Z888 Allergy status to other drugs, medicaments and biological substances status: Secondary | ICD-10-CM

## 2020-01-09 DIAGNOSIS — I480 Paroxysmal atrial fibrillation: Secondary | ICD-10-CM | POA: Diagnosis present

## 2020-01-09 DIAGNOSIS — Z9011 Acquired absence of right breast and nipple: Secondary | ICD-10-CM

## 2020-01-09 DIAGNOSIS — Z83438 Family history of other disorder of lipoprotein metabolism and other lipidemia: Secondary | ICD-10-CM

## 2020-01-09 DIAGNOSIS — Z66 Do not resuscitate: Secondary | ICD-10-CM | POA: Diagnosis present

## 2020-01-09 DIAGNOSIS — S065X0A Traumatic subdural hemorrhage without loss of consciousness, initial encounter: Secondary | ICD-10-CM | POA: Diagnosis present

## 2020-01-09 DIAGNOSIS — S0240DA Maxillary fracture, left side, initial encounter for closed fracture: Secondary | ICD-10-CM | POA: Diagnosis present

## 2020-01-09 DIAGNOSIS — S62102A Fracture of unspecified carpal bone, left wrist, initial encounter for closed fracture: Secondary | ICD-10-CM | POA: Diagnosis present

## 2020-01-09 DIAGNOSIS — K5900 Constipation, unspecified: Secondary | ICD-10-CM | POA: Diagnosis present

## 2020-01-09 LAB — RESP PANEL BY RT-PCR (FLU A&B, COVID) ARPGX2
Influenza A by PCR: NEGATIVE
Influenza B by PCR: NEGATIVE
SARS Coronavirus 2 by RT PCR: NEGATIVE

## 2020-01-09 LAB — HEPATIC FUNCTION PANEL
ALT: 14 U/L (ref 0–44)
AST: 28 U/L (ref 15–41)
Albumin: 4.4 g/dL (ref 3.5–5.0)
Alkaline Phosphatase: 64 U/L (ref 38–126)
Bilirubin, Direct: 0.2 mg/dL (ref 0.0–0.2)
Indirect Bilirubin: 0.9 mg/dL (ref 0.3–0.9)
Total Bilirubin: 1.1 mg/dL (ref 0.3–1.2)
Total Protein: 7.9 g/dL (ref 6.5–8.1)

## 2020-01-09 LAB — CBC WITH DIFFERENTIAL/PLATELET
Abs Immature Granulocytes: 0.07 10*3/uL (ref 0.00–0.07)
Basophils Absolute: 0.1 10*3/uL (ref 0.0–0.1)
Basophils Relative: 0 %
Eosinophils Absolute: 0 10*3/uL (ref 0.0–0.5)
Eosinophils Relative: 0 %
HCT: 41.5 % (ref 36.0–46.0)
Hemoglobin: 13.8 g/dL (ref 12.0–15.0)
Immature Granulocytes: 0 %
Lymphocytes Relative: 6 %
Lymphs Abs: 1 10*3/uL (ref 0.7–4.0)
MCH: 30.1 pg (ref 26.0–34.0)
MCHC: 33.3 g/dL (ref 30.0–36.0)
MCV: 90.6 fL (ref 80.0–100.0)
Monocytes Absolute: 1 10*3/uL (ref 0.1–1.0)
Monocytes Relative: 6 %
Neutro Abs: 15.5 10*3/uL — ABNORMAL HIGH (ref 1.7–7.7)
Neutrophils Relative %: 88 %
Platelets: 251 10*3/uL (ref 150–400)
RBC: 4.58 MIL/uL (ref 3.87–5.11)
RDW: 12.8 % (ref 11.5–15.5)
WBC: 17.6 10*3/uL — ABNORMAL HIGH (ref 4.0–10.5)
nRBC: 0 % (ref 0.0–0.2)

## 2020-01-09 LAB — BASIC METABOLIC PANEL
Anion gap: 16 — ABNORMAL HIGH (ref 5–15)
BUN: 17 mg/dL (ref 8–23)
CO2: 23 mmol/L (ref 22–32)
Calcium: 9.8 mg/dL (ref 8.9–10.3)
Chloride: 101 mmol/L (ref 98–111)
Creatinine, Ser: 0.83 mg/dL (ref 0.44–1.00)
GFR, Estimated: >60 mL/min (ref >60)
Glucose, Bld: 139 mg/dL — ABNORMAL HIGH (ref 70–99)
Potassium: 3.1 mmol/L — ABNORMAL LOW (ref 3.5–5.1)
Sodium: 140 mmol/L (ref 135–145)

## 2020-01-09 LAB — PROTIME-INR
INR: 2.4 — ABNORMAL HIGH (ref 0.8–1.2)
Prothrombin Time: 25.1 seconds — ABNORMAL HIGH (ref 11.4–15.2)

## 2020-01-09 MED ORDER — OXYCODONE HCL 5 MG PO TABS
5.0000 mg | ORAL_TABLET | Freq: Four times a day (QID) | ORAL | Status: DC | PRN
  Administered 2020-01-09 – 2020-01-12 (×7): 5 mg via ORAL
  Filled 2020-01-09 (×7): qty 1

## 2020-01-09 MED ORDER — ACETAMINOPHEN 500 MG PO TABS
1000.0000 mg | ORAL_TABLET | Freq: Once | ORAL | Status: AC
  Administered 2020-01-09: 1000 mg via ORAL
  Filled 2020-01-09: qty 2

## 2020-01-09 MED ORDER — ACETAMINOPHEN 650 MG RE SUPP: 650.0000 mg | Freq: Four times a day (QID) | RECTAL | Status: DC | PRN

## 2020-01-09 MED ORDER — LIDOCAINE 5 % EX PTCH
1.0000 | MEDICATED_PATCH | CUTANEOUS | Status: DC
  Administered 2020-01-10 – 2020-01-16 (×6): 1 via TRANSDERMAL
  Filled 2020-01-09 (×9): qty 1

## 2020-01-09 MED ORDER — LIDOCAINE 5 % EX PTCH: 1.0000 | MEDICATED_PATCH | Freq: Two times a day (BID) | CUTANEOUS | 0 refills | Status: AC

## 2020-01-09 MED ORDER — MORPHINE SULFATE (PF) 4 MG/ML IV SOLN
4.0000 mg | Freq: Once | INTRAVENOUS | Status: AC
  Administered 2020-01-09: 4 mg via INTRAVENOUS
  Filled 2020-01-09: qty 1

## 2020-01-09 MED ORDER — ONDANSETRON HCL 4 MG/2ML IJ SOLN
4.0000 mg | Freq: Four times a day (QID) | INTRAMUSCULAR | Status: DC | PRN
  Administered 2020-01-10 – 2020-01-13 (×2): 4 mg via INTRAVENOUS
  Filled 2020-01-09 (×2): qty 2

## 2020-01-09 MED ORDER — LACTATED RINGERS IV BOLUS: 500.0000 mL | Freq: Once | INTRAVENOUS | Status: DC

## 2020-01-09 MED ORDER — MORPHINE SULFATE (PF) 2 MG/ML IV SOLN
2.0000 mg | INTRAVENOUS | Status: DC | PRN
  Administered 2020-01-09 – 2020-01-10 (×3): 2 mg via INTRAVENOUS
  Filled 2020-01-09 (×3): qty 1

## 2020-01-09 MED ORDER — SODIUM CHLORIDE 0.45 % IV SOLN: INTRAVENOUS | Status: DC

## 2020-01-09 MED ORDER — OXYCODONE-ACETAMINOPHEN 5-325 MG PO TABS: 1.0000 | ORAL_TABLET | ORAL | 0 refills | Status: AC | PRN

## 2020-01-09 MED ORDER — ACETAMINOPHEN 325 MG PO TABS
650.0000 mg | ORAL_TABLET | Freq: Four times a day (QID) | ORAL | Status: DC | PRN
  Administered 2020-01-11 – 2020-01-17 (×12): 650 mg via ORAL
  Filled 2020-01-09 (×12): qty 2

## 2020-01-09 MED ORDER — POTASSIUM CHLORIDE CRYS ER 20 MEQ PO TBCR
40.0000 meq | EXTENDED_RELEASE_TABLET | Freq: Once | ORAL | Status: AC
  Administered 2020-01-09: 40 meq via ORAL
  Filled 2020-01-09: qty 2

## 2020-01-09 MED ORDER — ONDANSETRON HCL 4 MG PO TABS: 4.0000 mg | ORAL_TABLET | Freq: Four times a day (QID) | ORAL | Status: DC | PRN

## 2020-01-09 NOTE — ED Notes (Signed)
Pt readjusted in bed at this time

## 2020-01-09 NOTE — ED Triage Notes (Addendum)
Pt comes into the ED via EMS from [facility], states she slipped and fell hitting her head, has a hematoma above the left eye and having left wrist pain. Pt is on coumadin

## 2020-01-09 NOTE — ED Notes (Signed)
Pt given water, okay per MD Katrinka Blazing

## 2020-01-09 NOTE — ED Notes (Signed)
Pt states she would stay here just doesn't want to be transferred, MD notified

## 2020-01-09 NOTE — ED Provider Notes (Addendum)
I assumed care of this patient at approximately 1300 from outgoing provider Dr. Larinda Buttery.  Please see ongoing Fridays note for full details regarding patient's initial evaluation assessment.  In brief patient presents for assessment of left-sided headache and bruising on her left face as well as left arm pain and weakness after a ground-level mechanical fall that she sustained while shopping at [employer] earlier today.  She is anticoagulated on Coumadin for A. fib.  She is afebrile and hemodynamically stable arrival.  She has a nonfocal neuro exam with exception of weakness in the left upper extremity.  Initial work-up concerning for a small subdural hematoma as well as a proximal left humerus fracture and distal left radius fracture.  Dr. Larinda Buttery is spoken with Dr. Adriana Simas of neurosurgery who recommends 6-hour stability scan and if this is stable patient may be safe for discharge from perspective of her subdural.  He did not recommend reversal of her Coumadin at this time.  Dr. Larinda Buttery also spoke with Dr. Allena Katz who recommended sling for patient's proximal left humerus fracture as well as ulnar gutter splint for patient's distal left arm fracture and outpatient orthopedic follow-up.  Plan is also to obtain chest x-ray given patient did complain of some left-sided chest discomfort.  Chest x-ray, pelvic x-ray and repeat CT head are unremarkable.  After extensive discussion with patient and her son at bedside she states she feels safer being admitted for observation for PT and OT as well as pain control she currently resides alone she does not worsen after she got to help take care of herself.  She will be admitted to hospital service for further evaluation management.  Marland Kitchen1-3 Lead EKG Interpretation Performed by: Gilles Chiquito, MD Authorized by: Gilles Chiquito, MD     Interpretation: abnormal     ECG rate assessment: normal     Rhythm: atrial fibrillation     Ectopy: none     Conduction: normal         Gilles Chiquito, MD 01/09/20 2033    Gilles Chiquito, MD 01/09/20 2033

## 2020-01-09 NOTE — H&P (Signed)
History and Physical   Laura David LGX:[id number] DOB: [birth year] DOA: 01/09/2020  Referring MD/NP/PA: Dr Antoine Primas  PCP: Dorothey Baseman, MD   Outpatient Specialists: [facility]   Patient coming from: Home  Chief Complaint: Fall  HPI: Laura David is a 86 y.o. female with medical history significant of breast cancer, GERD, hyperlipidemia, hypertension, paroxysmal atrial fibrillation on chronic warfarin therapy, basal cell carcinoma among other things who came in from home after sustaining a mechanical fall today.  Patient was brought by her son.  She was turning at home when she missed a step and fell on her left outstretched arm.  She broke her fall with the left arm but not completely.  She hit the left side of her head as well as broke her proximal humerus and distal radius.  Patient also has small subarachnoid hemorrhage.  She is fully awake and alert.  Patient denied any dizziness.  She has remote history of vertigo but she was not dizzy today.  She has not been taking any medicines for that.  She did not lose any consciousness after the fall.  Patient was seen in the ER and evaluated.  Dr. Adriana Simas of neurosurgery has evaluated patient and no plan for any surgical intervention.  Recommends outpatient follow-up.  Orthopedic surgery also consulted with recommendations for splinting and outpatient surgery.  Patient is 76 and lives alone.  She has a son but works does not live with her.  She might be unable to care for herself now that she will be restrained with only one arm.  Patient being admitted to the hospital for evaluation and treatment and possible placement.  She is having pain now at 8 out of 10.  The pain is shooting down her left arm and also involving her left rib cage but no evidence of rib fractures so far..  ED Course: Temperature 97.7 blood pressure 145/86 pulse 94 respirate 22 oxygen sats 96% on room air.  White count 17.6 potassium 3.1 otherwise chemistry appear to be  within normal.  Acute viral screen is negative.  PT 25.1 INR 2.4.  Urinalysis essentially negative.  Head CT without contrast showed small acute subdural hematoma along the right tentorium.  Repeat head CT showed no significant change.  CT maxillofacial showed left periorbital and premaxillary soft tissue contusion and depressed fracture of the left anterior maxillary sinus extending to posterior maxillary sinus.  CT cervical spine showed no acute findings.  Chronic degenerative disease.  Patient will be admitted for further evaluation and treatment.  Review of Systems: As per HPI otherwise 10 point review of systems negative.    Past Medical History:  Diagnosis Date  . Asthma   . Breast cancer (HCC) 2010   RIGHT  . Cancer (HCC)    basel cell, s/p MOH'S/DR. DASHER  . GERD (gastroesophageal reflux disease) 06/12/11   EGD NORMAL  . Hx of mammogram 2014   NORMAL  . Hyperlipidemia   . Hypertension   . Osteopenia   . PAF (paroxysmal atrial fibrillation) (HCC)   . 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 ABDOMINAL HYSTERECTOMY  1980   D/T FIBROIDS  . BLADDER SUSPENSION  1983  . BREAST BIOPSY Left    benign   . BREAST SURGERY Right 2010   MASTECTOMY/CA  . COLONOSCOPY  2013   NORMAL  . MASTECTOMY Right 2010  . MOLES REMOVED  12/05/10  FROM [facility]     reports that she has never smoked. She has never used smokeless tobacco. She reports previous drug use. She reports that she does not drink alcohol.  Allergies  Allergen Reactions  . Flagyl [Metronidazole] Nausea Only  . Pacerone [Amiodarone]     Eye deposits  . Ramipril Cough    Family History  Problem Relation Age of Onset  . Heart disease Mother   . Stroke Mother   . Cancer Father        LUNG  . Diabetes Brother   . Heart disease Brother   . Hyperlipidemia Brother   . Hypertension Brother   . Diabetes Brother   . Heart disease Brother   . Hyperlipidemia  Brother   . Hypertension Brother   . Breast cancer Neg Hx      Prior to Admission medications   Medication Sig Start Date End Date Taking? Authorizing Provider  lidocaine (LIDODERM) 5 % Place 1 patch onto the skin every 12 (twelve) hours. Remove & Discard patch within 12 hours or as directed by MD 01/09/20 01/08/21 Yes Blake Divine, MD  oxyCODONE-acetaminophen (PERCOCET) 5-325 MG tablet Take 1 tablet by mouth every 4 (four) hours as needed for severe pain. 01/09/20 01/08/21 Yes Blake Divine, MD  amLODipine (NORVASC) 5 MG tablet Take 5 mg by mouth daily.    [provider]  cephALEXin (KEFLEX) 500 MG capsule Take 1 capsule (500 mg total) by mouth 3 (three) times daily. 04/10/14   Hyatt, Max T, DPM  digoxin (LANOXIN) 0.125 MG tablet Take 0.125 mg by mouth daily.    [provider]  meclizine (ANTIVERT) 25 MG tablet Take 1 tablet (25 mg total) by mouth 3 (three) times daily as needed for dizziness. 10/18/18   Lilia Pro., MD  metoprolol (LOPRESSOR) 100 MG tablet Take 50 mg by mouth daily.    [provider]  neomycin-polymyxin-hydrocortisone (CORTISPORIN) otic solution Apply one to two drops to toe after soaking twice daily. 04/10/14   Hyatt, Max T, DPM  simvastatin (ZOCOR) 10 MG tablet Take 10 mg by mouth daily.    [provider]  warfarin (COUMADIN) 2 MG tablet Take 2 mg by mouth daily. TAKE AS DIRECTED.    [provider]    Physical Exam: Vitals:   01/09/20 1900 01/09/20 2000 01/09/20 2130 01/09/20 2230  BP: 127/74 (!) 144/77 136/86 (!) 122/58  Pulse: 90 94 70 86  Resp: 17 20 (!) 22 16  Temp:      TempSrc:      SpO2: 100% 97% 99% 98%  Weight:      Height:          Constitutional: Acutely ill looking, left facial swelling and ecchymosis Vitals:   01/09/20 1900 01/09/20 2000 01/09/20 2130 01/09/20 2230  BP: 127/74 (!) 144/77 136/86 (!) 122/58  Pulse: 90 94 70 86  Resp: 17 20 (!) 22 16  Temp:      TempSrc:      SpO2: 100% 97% 99%  98%  Weight:      Height:       Eyes: PERRL, left eye swollen raccoon eyes  ENMT: Mucous membranes are moist. Posterior pharynx clear of any exudate or lesions.Normal dentition.  Left maxillary fracture with ecchymosis Neck: normal, supple, no masses, no thyromegaly Respiratory: clear to auscultation bilaterally, no wheezing, no crackles. Normal respiratory effort. No accessory muscle use.  Cardiovascular: Regular rate and rhythm, no murmurs / rubs / gallops. No extremity edema.  2+ pedal pulses. No carotid bruits.  Abdomen: no tenderness, no masses palpated. No hepatosplenomegaly. Bowel sounds positive.  Musculoskeletal: no clubbing / cyanosis.  Left upper extremity is swollen, displaced, tender normal muscle tone.  Skin: no rashes, lesions, ulcers. No induration Neurologic: CN 2-12 grossly intact. Sensation intact, DTR normal. Strength 5/5 in all 4.  Psychiatric: Normal judgment and insight. Alert and oriented x 3. Normal mood.     Labs on Admission: I have personally reviewed following labs and imaging studies  CBC: Recent Labs  Lab 01/09/20 1445  WBC 17.6*  NEUTROABS 15.5*  HGB 13.8  HCT 41.5  MCV 90.6  PLT 123XX123   Basic Metabolic Panel: Recent Labs  Lab 01/09/20 1445  NA 140  K 3.1*  CL 101  CO2 23  GLUCOSE 139*  BUN 17  CREATININE 0.83  CALCIUM 9.8   GFR: Estimated Creatinine Clearance: 36.8 mL/min (by C-G formula based on SCr of 0.83 mg/dL). Liver Function Tests: Recent Labs  Lab 01/09/20 1445  AST 28  ALT 14  ALKPHOS 64  BILITOT 1.1  PROT 7.9  ALBUMIN 4.4   No results for input(s): LIPASE, AMYLASE in the last 168 hours. No results for input(s): AMMONIA in the last 168 hours. Coagulation Profile: Recent Labs  Lab 01/09/20 1445  INR 2.4*   Cardiac Enzymes: No results for input(s): CKTOTAL, CKMB, CKMBINDEX, TROPONINI in the last 168 hours. BNP (last 3 results) No results for input(s): PROBNP in the last 8760 hours. HbA1C: No results for input(s):  HGBA1C in the last 72 hours. CBG: No results for input(s): GLUCAP in the last 168 hours. Lipid Profile: No results for input(s): CHOL, HDL, LDLCALC, TRIG, CHOLHDL, LDLDIRECT in the last 72 hours. Thyroid Function Tests: No results for input(s): TSH, T4TOTAL, FREET4, T3FREE, THYROIDAB in the last 72 hours. Anemia Panel: No results for input(s): VITAMINB12, FOLATE, FERRITIN, TIBC, IRON, RETICCTPCT in the last 72 hours. Urine analysis:    Component Value Date/Time   COLORURINE YELLOW (A) 10/18/2018 1143   APPEARANCEUR CLEAR (A) 10/18/2018 1143   LABSPEC 1.008 10/18/2018 1143   PHURINE 7.0 10/18/2018 1143   GLUCOSEU NEGATIVE 10/18/2018 1143   HGBUR SMALL (A) 10/18/2018 1143   BILIRUBINUR NEGATIVE 10/18/2018 1143   KETONESUR NEGATIVE 10/18/2018 1143   PROTEINUR NEGATIVE 10/18/2018 1143   NITRITE NEGATIVE 10/18/2018 1143   LEUKOCYTESUR NEGATIVE 10/18/2018 1143   Sepsis Labs: @LABRCNTIP (procalcitonin:4,lacticidven:4) ) Recent Results (from the past 240 hour(s))  Resp Panel by RT-PCR (Flu A&B, Covid) Nasopharyngeal Swab     Status: None   Collection Time: 01/09/20  9:14 PM   Specimen: Nasopharyngeal Swab; Nasopharyngeal(NP) swabs in vial transport medium  Result Value Ref Range Status   SARS Coronavirus 2 by RT PCR NEGATIVE NEGATIVE Final    Comment: (NOTE) SARS-CoV-2 target nucleic acids are NOT DETECTED.  The SARS-CoV-2 RNA is generally detectable in upper respiratory specimens during the acute phase of infection. The lowest concentration of SARS-CoV-2 viral copies this assay can detect is 138 copies/mL. A negative result does not preclude SARS-Cov-2 infection and should not be used as the sole basis for treatment or other patient management decisions. A negative result may occur with  improper specimen collection/handling, submission of specimen other than nasopharyngeal swab, presence of viral mutation(s) within the areas targeted by this assay, and inadequate number of  viral copies(<138 copies/mL). A negative result must be combined with clinical observations, patient history, and epidemiological information. The expected result is Negative.  Fact Sheet for Patients:  EntrepreneurPulse.com.au  Fact Sheet for Healthcare Providers:  IncredibleEmployment.be  This test is no t yet approved or cleared by the [place] FDA and  has been authorized for detection and/or diagnosis of SARS-CoV-2 by FDA under an Emergency Use Authorization (EUA). This EUA will remain  in effect (meaning this test can be used) for the duration of the COVID-19 declaration under Section 564(b)(1) of the Act, 21 U.S.C.section 360bbb-3(b)(1), unless the authorization is terminated  or revoked sooner.       Influenza A by PCR NEGATIVE NEGATIVE Final   Influenza B by PCR NEGATIVE NEGATIVE Final    Comment: (NOTE) The Xpert Xpress SARS-CoV-2/FLU/RSV plus assay is intended as an aid in the diagnosis of influenza from Nasopharyngeal swab specimens and should not be used as a sole basis for treatment. Nasal washings and aspirates are unacceptable for Xpert Xpress SARS-CoV-2/FLU/RSV testing.  Fact Sheet for Patients: EntrepreneurPulse.com.au  Fact Sheet for Healthcare Providers: IncredibleEmployment.be  This test is not yet approved or cleared by the [place] FDA and has been authorized for detection and/or diagnosis of SARS-CoV-2 by FDA under an Emergency Use Authorization (EUA). This EUA will remain in effect (meaning this test can be used) for the duration of the COVID-19 declaration under Section 564(b)(1) of the Act, 21 U.S.C. section 360bbb-3(b)(1), unless the authorization is terminated or revoked.  Performed at [facility], [address] [place], [place] [address]      Radiological Exams on Admission: DG Chest 1 View  Result Date: 01/09/2020 CLINICAL DATA:  Fall earlier  today, fell on left side, initial encounter. EXAM: CHEST  1 VIEW COMPARISON:  08/05/2009. FINDINGS: Trachea is midline. Heart is enlarged. Thoracic aorta is calcified. Biapical pleuroparenchymal scarring. Lungs are hyperinflated but clear. Osseous structures appear grossly intact. Degenerative changes in the right shoulder. IMPRESSION: 1. No acute findings. 2.  Aortic atherosclerosis (ICD10-I70.0). Electronically Signed   By: Lorin Picket M.D.   On: 01/09/2020 15:54   DG Elbow 2 Views Left  Result Date: 01/09/2020 CLINICAL DATA:  Pt reports that she slipped and fell in [employer] and landed on her left side. Pt has known wrist fracture, and complains of pain up and down her left arm. Pt says the majority of her pain is under her armpit EXAM: LEFT ELBOW - 2 VIEW COMPARISON:  None. FINDINGS: No fracture or bone lesion. Elbow joint normally spaced and aligned.  No joint effusion. Soft tissues are unremarkable. IMPRESSION: Negative. Electronically Signed   By: Lajean Manes M.D.   On: 01/09/2020 14:17   DG Wrist Complete Left  Result Date: 01/09/2020 CLINICAL DATA:  Pt reports that she was in [employer] when she tripped over something and landed on her left side. Pt states that she has left wrist pain at this time that radiates up her arm. Unsure if she hit anything on the way down or how she caught herself EXAM: LEFT WRIST - COMPLETE 3+ VIEW COMPARISON:  None. FINDINGS: Comminuted fracture of the distal radius with intra-articular component. A volar fracture component is displaced anteriorly by 3 mm. Mild impaction of 2-3 mm. Distal radial articular surface has slight dorsal angulation of 5-6 degrees. There is mild loss of radial inclination. Wrist joints are normally aligned. There is diffuse surrounding soft tissue swelling. IMPRESSION: 1. Comminuted, intra-articular fracture the distal radius as detailed. No dislocation. Electronically Signed   By: Lajean Manes M.D.   On: 01/09/2020 13:16   CT Head Wo  Contrast  Result Date:  01/09/2020 CLINICAL DATA:  Follow-up. Patient fell at the grocery store. On blood thinners. CT obtained earlier today demonstrates a subdural hemorrhage along the right tentorium. EXAM: CT HEAD WITHOUT CONTRAST TECHNIQUE: Contiguous axial images were obtained from the base of the skull through the vertex without intravenous contrast. COMPARISON:  None. FINDINGS: Brain: Small subdural hemorrhage along the right tentorium is unchanged. No evidence of new intracranial hemorrhage. Ventricular and sulcal enlargement consistent with atrophy is stable. Patchy white matter hypoattenuation consistent with chronic microvascular ischemic changes stable. There are no parenchymal masses or mass effect, no midline shift, no evidence of ischemic infarct and no extra-axial masses. Vascular: No hyperdense vessel or unexpected calcification. Skull: No skull fracture or bone lesion. Sinuses/Orbits: Visualized globes and orbits are unremarkable. Small amount of dependent secretions in the right sphenoid sinus dependent fluid in the left maxillary sinus. These findings are stable. Other: None. IMPRESSION: 1. No change from the earlier head CT. 2. Small acute subdural hematoma along the right tentorium. No new intracranial hemorrhage. Electronically Signed   By: Lajean Manes M.D.   On: 01/09/2020 19:34   CT Head Wo Contrast  Result Date: 01/09/2020 CLINICAL DATA:  Head trauma. Trip and fall. Left supraorbital hematoma. Patient is on blood thinners. EXAM: CT HEAD WITHOUT CONTRAST CT MAXILLOFACIAL WITHOUT CONTRAST TECHNIQUE: Multidetector CT imaging of the head and maxillofacial structures were performed using the standard protocol without intravenous contrast. Multiplanar CT image reconstructions of the maxillofacial structures were also generated. COMPARISON:  October 18, 2018. FINDINGS: CT HEAD FINDINGS Brain: Thin (3-4 mm thick) acute subdural hemorrhage layering along the right tentorial leaflet (series 4,  image 45), new from prior. No evidence of acute infarction, hydrocephalus, extra-axial collection or mass lesion/mass effect. Patchy white matter hypoattenuation, most likely related to chronic microvascular ischemic disease. Similar generalized cerebral volume loss with ex vacuo ventricular dilation. Vascular: Calcific atherosclerosis. Skull: No acute fracture. Other: No mastoid effusions. CT MAXILLOFACIAL FINDINGS Osseous: Acute mildly depressed fracture of the left anterior maxillary sinus wall with nondisplaced extension through the posterior maxillary sinus wall Orbits: Globes are symmetric and within normal limits. No retro bulbar hemorrhage. No evidence of acute orbital fracture. Sinuses: Left maxillary hemosinus. Frothy secretions in the right sphenoid sinus. Small right maxillary retention cyst. Soft tissues: Left periorbital and premaxillary soft tissue contusion. IMPRESSION: 1. Thin (3-4 mm thick) acute subdural hemorrhage layering along the right tentorial leaflet. No substantial mass effect. 2. Left periorbital and premaxillary soft tissue contusion with acute mildly depressed fracture of the left anterior maxillary sinus wall with nondisplaced extension through the posterior maxillary sinus wall. Findings were discussed with Dr. Charna Archer At 1:16 p.m. via telephone. Electronically Signed   By: Margaretha Sheffield MD   On: 01/09/2020 13:20   CT Cervical Spine Wo Contrast  Result Date: 01/09/2020 CLINICAL DATA:  Neck trauma. Status post fall. LEFT orbital hematoma and subdural hemorrhage. EXAM: CT CERVICAL SPINE WITHOUT CONTRAST TECHNIQUE: Multidetector CT imaging of the cervical spine was performed without intravenous contrast. Multiplanar CT image reconstructions were also generated. COMPARISON:  CT of the head earlier today FINDINGS: Alignment: There is loss of cervical lordosis. Degenerative changes are identified at C5-6. There is 2 millimeters of anterolisthesis of C4 on C5. Otherwise alignment is  normal. Skull base and vertebrae: No acute fracture. No primary bone lesion or focal pathologic process. Soft tissues and spinal canal: No prevertebral fluid or swelling. No visible canal hematoma. Disc levels: Disc height loss and uncovertebral spurring primarily at C5-6. Upper chest:  Negative. Other: None. IMPRESSION: 1. No evidence for acute cervical spine abnormality. 2. Significant degenerative changes at C5-6. 3. Loss of cervical lordosis. Electronically Signed   By: Norva Pavlov M.D.   On: 01/09/2020 14:04   DG Pelvis Portable  Result Date: 01/09/2020 CLINICAL DATA:  Larey Seat at the grocery store earlier today onto her left side. Pain. EXAM: PORTABLE PELVIS 1-2 VIEWS COMPARISON:  None. FINDINGS: No fracture or bone lesion. Hip joints, SI joints and symphysis pubis are normally spaced and aligned. No significant arthropathic change. Soft tissues are unremarkable. There are disc degenerative changes of the lower lumbar spine. IMPRESSION: 1. No fracture or acute finding.  No significant joint abnormality Electronically Signed   By: Amie Portland M.D.   On: 01/09/2020 15:53   DG Shoulder Left  Result Date: 01/09/2020 CLINICAL DATA:  Slipped and fell landing on the left side. Left shoulder pain. EXAM: LEFT SHOULDER - 2+ VIEW COMPARISON:  None. FINDINGS: Comminuted fracture of the proximal humerus. There is a transverse fracture across the surgical neck with additional fracture across the base of the greater tuberosity. Tuberosity fracture component is mildly displaced, 5-6 mm laterally. There is no significant fracture angulation. Glenohumeral joint remains normally aligned. AC joint normally spaced and aligned. Skeletal structures are demineralized. IMPRESSION: 1. Mildly comminuted fracture of the proximal left humerus as described. No dislocation. Electronically Signed   By: Amie Portland M.D.   On: 01/09/2020 14:16   CT MAXILLOFACIAL WO CONTRAST  Result Date: 01/09/2020 CLINICAL DATA:  Head trauma.  Trip and fall. Left supraorbital hematoma. Patient is on blood thinners. EXAM: CT HEAD WITHOUT CONTRAST CT MAXILLOFACIAL WITHOUT CONTRAST TECHNIQUE: Multidetector CT imaging of the head and maxillofacial structures were performed using the standard protocol without intravenous contrast. Multiplanar CT image reconstructions of the maxillofacial structures were also generated. COMPARISON:  October 18, 2018. FINDINGS: CT HEAD FINDINGS Brain: Thin (3-4 mm thick) acute subdural hemorrhage layering along the right tentorial leaflet (series 4, image 45), new from prior. No evidence of acute infarction, hydrocephalus, extra-axial collection or mass lesion/mass effect. Patchy white matter hypoattenuation, most likely related to chronic microvascular ischemic disease. Similar generalized cerebral volume loss with ex vacuo ventricular dilation. Vascular: Calcific atherosclerosis. Skull: No acute fracture. Other: No mastoid effusions. CT MAXILLOFACIAL FINDINGS Osseous: Acute mildly depressed fracture of the left anterior maxillary sinus wall with nondisplaced extension through the posterior maxillary sinus wall Orbits: Globes are symmetric and within normal limits. No retro bulbar hemorrhage. No evidence of acute orbital fracture. Sinuses: Left maxillary hemosinus. Frothy secretions in the right sphenoid sinus. Small right maxillary retention cyst. Soft tissues: Left periorbital and premaxillary soft tissue contusion. IMPRESSION: 1. Thin (3-4 mm thick) acute subdural hemorrhage layering along the right tentorial leaflet. No substantial mass effect. 2. Left periorbital and premaxillary soft tissue contusion with acute mildly depressed fracture of the left anterior maxillary sinus wall with nondisplaced extension through the posterior maxillary sinus wall. Findings were discussed with Dr. Larinda Buttery At 1:16 p.m. via telephone. Electronically Signed   By: Feliberto Harts MD   On: 01/09/2020 13:20    EKG: Independently reviewed.   Shows atrial fibrillation with controlled rate of 95.  Assessment/Plan Principal Problem:   Fall Active Problems:   Hypertension   Hyperlipidemia   PAF (paroxysmal atrial fibrillation) (HCC)   Asthma   GERD (gastroesophageal reflux disease)   Breast cancer (HCC)   Coagulopathy (HCC)   SDH (subdural hematoma) (HCC)   Leucocytosis   Hypokalemia   Closed comminuted  left humeral fracture     #1 status post fall: Patient will be admitted.  PT OT consultation.  Continue regular medications.  Most likely may require placement as patient is going to have difficulty taking care of herself.  #2 multiple fractures: Patient has left humeral and radial fractures as well as left maxillary fractures.  No surgery indicated.  At this point patient will be admitted mainly for pain control and supportive care.  Orthopedics recommend outpatient follow-up.  #3 hypokalemia: Replete potassium.  #4 coagulopathy: Secondary to warfarin.  With intracranial hemorrhage and will reverse INR.  #5 subdural hemorrhage: Secondary to fall and anticoagulation.  #6 GERD: We will give PPIs.  #7 paroxysmal atrial fibrillation: Rate is controlled.  Will reverse anticoagulation.  #8 essential hypertension: Controlled  #9 leukocytosis: No obvious cause.   DVT prophylaxis: SCD Code Status: Full code Family Communication: Son at bedside Disposition Plan: To be determined Consults called: Dr. Lacinda Axon of neurosurgery, Dr. Posey Pronto orthopedics Admission status: Inpatient  Severity of Illness: The appropriate patient status for this patient is INPATIENT. Inpatient status is judged to be reasonable and necessary in order to provide the required intensity of service to ensure the patient's safety. The patient's presenting symptoms, physical exam findings, and initial radiographic and laboratory data in the context of their chronic comorbidities is felt to place them at high risk for further clinical deterioration.  Furthermore, it is not anticipated that the patient will be medically stable for discharge from the hospital within 2 midnights of admission. The following factors support the patient status of inpatient.   " The patient's presenting symptoms include fall. " The worrisome physical exam findings include left facial fracture visible with left upper extremity injuries. " The initial radiographic and laboratory data are worrisome because of humeral fracture was radial fracture. " The chronic co-morbidities include atrial fibrillation.   * I certify that at the point of admission it is my clinical judgment that the patient will require inpatient hospital care spanning beyond 2 midnights from the point of admission due to high intensity of service, high risk for further deterioration and high frequency of surveillance required.Barbette Merino MD [facility] Pager [phone]  If 7PM-7AM, please contact night-coverage [email or web address] Password [facility]  01/10/2020, 12:14 AM

## 2020-01-09 NOTE — ED Notes (Signed)
Family at bedside, pt stating she would feel more comfortable at home and wishes to go home with son to help her and stay with her. MD to be notified

## 2020-01-09 NOTE — ED Notes (Signed)
Family updated at this time.

## 2020-01-09 NOTE — ED Notes (Signed)
admitting MD at bedside

## 2020-01-09 NOTE — ED Notes (Signed)
Pt to CT

## 2020-01-09 NOTE — ED Provider Notes (Signed)
[facility] Emergency Department Provider Note   ____________________________________________   Event Date/Time   First MD Initiated Contact with Patient 01/09/20 1330     (approximate)  I have reviewed the triage vital signs and the nursing notes.   HISTORY  Chief Complaint Fall and Head Injury    HPI Laura David is a 86 y.o. female with past medical history of hypertension, hyperlipidemia, and paroxysmal A. fib on Coumadin who presents to the ED for fall.  Patient reports that she was at the grocery store earlier today when she tripped on part of a cart, falling forward and striking her head.  She denies losing consciousness, but does complain of pain along her left side including the left side of her head, left wrist, left shoulder, and left chest wall.  She denies any vision changes, speech changes, numbness, or weakness.  She is otherwise been feeling well recently and has not had any fevers, cough, chest pain, or shortness of breath.        Past Medical History:  Diagnosis Date  . Asthma   . Breast cancer ([place]) 2010   RIGHT  . Cancer (H[facility])    basel cell, s/p MOH'S/DR. DASHER  . GERD (gastroesophageal reflux disease) 06/12/11   EGD NORMAL  . Hx of mammogram 2014   NORMAL  . Hyperlipidemia   . Hypertension   . Osteopenia   . PAF (paroxysmal atrial fibrillation) ([place])   . 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 Mitral regurgitation 05/10/2013  . Tricuspid regurgitation 05/10/2013  . Hypertension   . Hyperlipidemia   . PAF (paroxysmal atrial fibrillation) ([place])   . Osteopenia   . Cancer ([place])   . Asthma   . GERD (gastroesophageal reflux disease)   . Rickettsial disease   . Breast cancer [facility])     Past Surgical History:  Procedure Laterality Date  . ABDOMINAL HYSTERECTOMY  1980   D/T FIBROIDS  . BLADDER SUSPENSION  1983  . BREAST BIOPSY Left    benign   . BREAST  SURGERY Right 2010   MASTECTOMY/CA  . COLONOSCOPY  2013   NORMAL  . MASTECTOMY Right 2010  . MOLES REMOVED  12/05/10   FROM [facility]    Prior to Admission medications   Medication Sig Start Date End Date Taking? Authorizing Provider  lidocaine (LIDODERM) 5 % Place 1 patch onto the skin every 12 (twelve) hours. Remove & Discard patch within 12 hours or as directed by MD 01/09/20 01/08/21 Yes Blake Divine, MD  oxyCODONE-acetaminophen (PERCOCET) 5-325 MG tablet Take 1 tablet by mouth every 4 (four) hours as needed for severe pain. 01/09/20 01/08/21 Yes Blake Divine, MD  amLODipine (NORVASC) 5 MG tablet Take 5 mg by mouth daily.    [provider]  cephALEXin (KEFLEX) 500 MG capsule Take 1 capsule (500 mg total) by mouth 3 (three) times daily. 04/10/14   Hyatt, Max T, DPM  digoxin (LANOXIN) 0.125 MG tablet Take 0.125 mg by mouth daily.    [provider]  meclizine (ANTIVERT) 25 MG tablet Take 1 tablet (25 mg total) by mouth 3 (three) times daily as needed for dizziness. 10/18/18   Lilia Pro., MD  metoprolol (LOPRESSOR) 100 MG tablet Take 50 mg by mouth daily.    [provider]  neomycin-polymyxin-hydrocortisone (CORTISPORIN) otic solution Apply one to two drops to toe after soaking twice daily.  04/10/14   Hyatt, Max T, DPM  simvastatin (ZOCOR) 10 MG tablet Take 10 mg by mouth daily.    [provider]  warfarin (COUMADIN) 2 MG tablet Take 2 mg by mouth daily. TAKE AS DIRECTED.    [provider]    Allergies Flagyl [metronidazole], Pacerone [amiodarone], and Ramipril  Family History  Problem Relation Age of Onset  . Heart disease Mother   . Stroke Mother   . Cancer Father        LUNG  . Diabetes Brother   . Heart disease Brother   . Hyperlipidemia Brother   . Hypertension Brother   . Diabetes Brother   . Heart disease Brother   . Hyperlipidemia Brother   . Hypertension Brother   . Breast cancer Neg Hx     Social History Social History    Tobacco Use  . Smoking status: Never Smoker  . Smokeless tobacco: Never Used  Substance Use Topics  . Alcohol use: No  . Drug use: Not Currently    Review of Systems  Constitutional: No fever/chills Eyes: No visual changes. ENT: No sore throat. Cardiovascular: Positive for left chest pain. Respiratory: Denies shortness of breath. Gastrointestinal: No abdominal pain.  No nausea, no vomiting.  No diarrhea.  No constipation. Genitourinary: Negative for dysuria. Musculoskeletal: Negative for back pain.  Positive for left arm pain. Skin: Negative for rash. Neurological: Positive for headaches, negative for focal weakness or numbness.  ____________________________________________   PHYSICAL EXAM:  VITAL SIGNS: ED Triage Vitals  Enc Vitals Group     BP 01/09/20 1243 134/64     Pulse Rate 01/09/20 1243 76     Resp 01/09/20 1243 17     Temp 01/09/20 1243 97.7 F (36.5 C)     Temp Source 01/09/20 1243 Oral     SpO2 01/09/20 1243 100 %     Weight 01/09/20 1233 100 lb (45.4 kg)     Height 01/09/20 1233 5' (1.524 m)     Head Circumference --      Peak Flow --      Pain Score 01/09/20 1232 7     Pain Loc --      Pain Edu? --      Excl. in GC? --     Constitutional: Alert and oriented. Eyes: Conjunctivae are normal.  Pupils equal round and reactive to light bilaterally. Head: Hematoma over left frontal scalp, left maxillary bony tenderness noted.  No facial bony step-offs or obvious deformities. Nose: No congestion/rhinnorhea. Mouth/Throat: Mucous membranes are moist. Neck: Normal ROM, no midline cervical spine tenderness. Cardiovascular: Normal rate, regular rhythm. Grossly normal heart sounds.  2+ radial and DP pulses bilaterally. Respiratory: Normal respiratory effort.  No retractions. Lungs CTAB.  Left chest wall tenderness to palpation. Gastrointestinal: Soft and nontender. No distention. Genitourinary: deferred Musculoskeletal: Diffuse tenderness to left shoulder,  left elbow, and left wrist with obvious deformity to left wrist.  Pelvis stable with no tenderness.  No lower extremity tenderness nor edema. Neurologic:  Normal speech and language. No gross focal neurologic deficits are appreciated. Skin:  Skin is warm, dry and intact. No rash noted. Psychiatric: Mood and affect are normal. Speech and behavior are normal.  ____________________________________________   LABS (all labs ordered are listed, but only abnormal results are displayed)  Labs Reviewed  BASIC METABOLIC PANEL - Abnormal; Notable for the following components:      Result Value   Potassium 3.1 (*)    Glucose, Bld 139 (*)  Anion gap 16 (*)    All other components within normal limits  CBC WITH DIFFERENTIAL/PLATELET - Abnormal; Notable for the following components:   WBC 17.6 (*)    Neutro Abs 15.5 (*)    All other components within normal limits  PROTIME-INR - Abnormal; Notable for the following components:   Prothrombin Time 25.1 (*)    INR 2.4 (*)    All other components within normal limits  HEPATIC FUNCTION PANEL  SAMPLE TO BLOOD BANK    PROCEDURES  Procedure(s) performed (including Critical Care):  Procedures   ____________________________________________   INITIAL IMPRESSION / ASSESSMENT AND PLAN / ED COURSE       86 year old female with past medical history of hypertension, hyperlipidemia, and paroxysmal A. fib on Coumadin who presents to the ED complaining of left arm, chest wall, and head pain following a mechanical fall.  Patient obtain CT head and maxillofacial from triage significant for very small subdural hematoma with no midline shift as well as nondisplaced maxillary sinus fractures.  She is awake and alert, with no focal neurologic deficits.  Case discussed with Dr. Lacinda Axon of neurosurgery, who recommends follow-up head CT in 6 hours to ensure stability, but states no need to reverse her Coumadin at this time.  INR currently therapeutic at 2.3, remainder  of labs unremarkable.  X-rays of left wrist reviewed by me and show comminuted fracture without angulation.  X-rays also obtained of left shoulder, which show proximal humerus fracture, x-rays of left elbow are negative for acute process.  Given patient's chest wall pain, we will also check chest x-ray.  Case discussed with Dr. Posey Pronto from orthopedics, who agrees with plan for sugar tong splint as well as sling for distal radius and proximal humerus fractures.  Patient may follow-up with ENT as an outpatient for sinus fractures.  Patient turned over to oncoming provider pending repeat head CT and reassessment.      ____________________________________________   FINAL CLINICAL IMPRESSION(S) / ED DIAGNOSES  Final diagnoses:  Closed supracondylar fracture of left humerus, initial encounter  Closed fracture of left wrist, initial encounter  Subdural hematoma ([place])  Hypokalemia  Fall     ED Discharge Orders         Ordered    oxyCODONE-acetaminophen (PERCOCET) 5-325 MG tablet  Every 4 hours PRN        01/09/20 1549    lidocaine (LIDODERM) 5 %  Every 12 hours        01/09/20 1549           Note:  This document was prepared using Dragon voice recognition software and may include unintentional dictation errors.   Blake Divine, MD 01/09/20 [phone]

## 2020-01-09 NOTE — Consult Note (Signed)
Neurosurgery-New Consultation Evaluation 01/09/2020 Laura David [id number]  Identifying Statement: Laura David is a 86 y.o. female from [place] [address] with fall  Physician Requesting Consultation: [facility] Emergency Department  History of Present Illness: [name] is here after a fall with facial trauma, left arm fracture, and concern for small tentorial SDH. She is on coumadin. She states she had a mechanical fall and denies LOC. She currently does not have any neurologic symptoms but has pain on left side given injuries. Given the CT head findings, we are consulted to evaluate.   Past Medical History:  Past Medical History:  Diagnosis Date  . Asthma   . Breast cancer (HCC) 2010   RIGHT  . Cancer (HCC)    basel cell, s/p MOH'S/DR. DASHER  . GERD (gastroesophageal reflux disease) 06/12/11   EGD NORMAL  . Hx of mammogram 2014   NORMAL  . Hyperlipidemia   . Hypertension   . Osteopenia   . PAF (paroxysmal atrial fibrillation) ([facility])   . 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 Marital status: Widowed    Spouse name: Not on file  . Number of children: Not on file  . Years of education: Not on file  . Highest education level: Not on file  Occupational History  . Not on file  Tobacco Use  . Smoking status: Never Smoker  . Smokeless tobacco: Never Used  Substance and Sexual Activity  . Alcohol use: No  . Drug use: Not Currently  . Sexual activity: Not on file  Other Topics Concern  . Not on file  Social History Narrative  . Not on file   Social Determinants of Health   Financial Resource Strain: Not on file  Food Insecurity: Not on file  Transportation Needs: Not on file  Physical Activity: Not on file  Stress: Not on file  Social Connections: Not on file  Intimate Partner Violence: Not on file    Family History: Family History  Problem Relation Age of Onset  . Heart disease  Mother   . Stroke Mother   . Cancer Father        LUNG  . Diabetes Brother   . Heart disease Brother   . Hyperlipidemia Brother   . Hypertension Brother   . Diabetes Brother   . Heart disease Brother   . Hyperlipidemia Brother   . Hypertension Brother   . Breast cancer Neg Hx     Review of Systems:  Review of Systems - General ROS: Negative Psychological ROS: Negative Ophthalmic ROS: Negative ENT ROS: Negative Hematological and Lymphatic ROS: Negative  Endocrine ROS: Negative Respiratory ROS: Negative Cardiovascular ROS: Negative Gastrointestinal ROS: Negative Genito-Urinary ROS: Negative Musculoskeletal ROS: Positive for arm fracture Neurological ROS: Negative speech difficulty, weakness, numbness Dermatological ROS: Negative  Physical Exam: BP (!) 144/77   Pulse 94   Temp 97.7 F (36.5 C) (Oral)   Resp 20   Ht 5' (1.524 m)   Wt 45.4 kg   SpO2 97%   BMI 19.53 kg/m  Body mass index is 19.53 kg/m. Body surface area is 1.39 meters squared. General appearance: Alert, cooperative, in no acute distress Head: Normocephalic, trauma to left lip, ecchymosis and swelling around left orbit Eyes: Normal, EOM intact Oropharynx: Moist without lesions, wearing facemask Ext: No edema in LE bilaterally, tenderness to left arm with guarding  Neurologic exam:  Mental status: alertness: alert, orientation:  person, place, time, affect: normal Speech: fluent and clear, naming and repetition intact Cranial nerves:  II: Visual fields are full by confrontation III/IV/VI: extra-ocular motions intact bilaterally V/VII:no evidence of facial droop or weakness  VIII: hearing normal XI: trapezius strength symmetric,  sternocleidomastoid strength symmetric XII: tongue strength symmetric  Motor:strength symmetric 5/5, normal muscle mass and tone in RUE, BLE, strong grip in LUE but unable to test remainder Sensory: intact to light touch in all extremities Gait: not  tested  Laboratory: Results for orders placed or performed during the hospital encounter of [id number]  Basic metabolic panel  Result Value Ref Range   Sodium 140 135 - 145 mmol/L   Potassium 3.1 (L) 3.5 - 5.1 mmol/L   Chloride 101 98 - 111 mmol/L   CO2 23 22 - 32 mmol/L   Glucose, Bld 139 (H) 70 - 99 mg/dL   BUN 17 8 - 23 mg/dL   Creatinine, Ser 0.83 0.44 - 1.00 mg/dL   Calcium 9.8 8.9 - 10.3 mg/dL   GFR, Estimated >60 >60 mL/min   Anion gap 16 (H) 5 - 15  CBC with Differential  Result Value Ref Range   WBC 17.6 (H) 4.0 - 10.5 K/uL   RBC 4.58 3.87 - 5.11 MIL/uL   Hemoglobin 13.8 12.0 - 15.0 g/dL   HCT 41.5 36.0 - 46.0 %   MCV 90.6 80.0 - 100.0 fL   MCH 30.1 26.0 - 34.0 pg   MCHC 33.3 30.0 - 36.0 g/dL   RDW 12.8 11.5 - 15.5 %   Platelets 251 150 - 400 K/uL   nRBC 0.0 0.0 - 0.2 %   Neutrophils Relative % 88 %   Neutro Abs 15.5 (H) 1.7 - 7.7 K/uL   Lymphocytes Relative 6 %   Lymphs Abs 1.0 0.7 - 4.0 K/uL   Monocytes Relative 6 %   Monocytes Absolute 1.0 0.1 - 1.0 K/uL   Eosinophils Relative 0 %   Eosinophils Absolute 0.0 0.0 - 0.5 K/uL   Basophils Relative 0 %   Basophils Absolute 0.1 0.0 - 0.1 K/uL   Immature Granulocytes 0 %   Abs Immature Granulocytes 0.07 0.00 - 0.07 K/uL  Protime-INR  Result Value Ref Range   Prothrombin Time 25.1 (H) 11.4 - 15.2 seconds   INR 2.4 (H) 0.8 - 1.2  Hepatic function panel  Result Value Ref Range   Total Protein 7.9 6.5 - 8.1 g/dL   Albumin 4.4 3.5 - 5.0 g/dL   AST 28 15 - 41 U/L   ALT 14 0 - 44 U/L   Alkaline Phosphatase 64 38 - 126 U/L   Total Bilirubin 1.1 0.3 - 1.2 mg/dL   Bilirubin, Direct 0.2 0.0 - 0.2 mg/dL   Indirect Bilirubin 0.9 0.3 - 0.9 mg/dL   I personally reviewed labs  Imaging: CT Head: 1. No change from the earlier head CT. 2. Small acute subdural hematoma along the right tentorium. No new intracranial hemorrhage.  Impression/Plan:  Ms Esquivel is here with a tiny tentorial subdural hematoma. These types of  hemorrhages rarely expand and she is at near zero risk of further symptomatic progression. Given this, I would recommend holding coumadin for a week but would not reverse. She is OK for DVT prophylaxis as needed. No further imaging or treatment is needed. No follow up needed   1.  Diagnosis: Tentorial SDH  2.  Plan No further imaging needed Hold coumadin for 1 week

## 2020-01-10 ENCOUNTER — Inpatient Hospital Stay: Payer: Medicare Other

## 2020-01-10 DIAGNOSIS — S065X0A Traumatic subdural hemorrhage without loss of consciousness, initial encounter: Secondary | ICD-10-CM | POA: Diagnosis present

## 2020-01-10 DIAGNOSIS — D6832 Hemorrhagic disorder due to extrinsic circulating anticoagulants: Secondary | ICD-10-CM | POA: Diagnosis present

## 2020-01-10 DIAGNOSIS — S52572A Other intraarticular fracture of lower end of left radius, initial encounter for closed fracture: Secondary | ICD-10-CM | POA: Diagnosis present

## 2020-01-10 DIAGNOSIS — I10 Essential (primary) hypertension: Secondary | ICD-10-CM | POA: Diagnosis present

## 2020-01-10 DIAGNOSIS — S065X9A Traumatic subdural hemorrhage with loss of consciousness of unspecified duration, initial encounter: Secondary | ICD-10-CM | POA: Diagnosis not present

## 2020-01-10 DIAGNOSIS — T45515A Adverse effect of anticoagulants, initial encounter: Secondary | ICD-10-CM | POA: Diagnosis present

## 2020-01-10 DIAGNOSIS — I480 Paroxysmal atrial fibrillation: Secondary | ICD-10-CM | POA: Diagnosis present

## 2020-01-10 DIAGNOSIS — K5900 Constipation, unspecified: Secondary | ICD-10-CM | POA: Diagnosis present

## 2020-01-10 DIAGNOSIS — Z9011 Acquired absence of right breast and nipple: Secondary | ICD-10-CM | POA: Diagnosis not present

## 2020-01-10 DIAGNOSIS — Z888 Allergy status to other drugs, medicaments and biological substances status: Secondary | ICD-10-CM | POA: Diagnosis not present

## 2020-01-10 DIAGNOSIS — S62102A Fracture of unspecified carpal bone, left wrist, initial encounter for closed fracture: Secondary | ICD-10-CM | POA: Diagnosis not present

## 2020-01-10 DIAGNOSIS — Z20822 Contact with and (suspected) exposure to covid-19: Secondary | ICD-10-CM | POA: Diagnosis present

## 2020-01-10 DIAGNOSIS — Y92009 Unspecified place in unspecified non-institutional (private) residence as the place of occurrence of the external cause: Secondary | ICD-10-CM | POA: Diagnosis not present

## 2020-01-10 DIAGNOSIS — J45909 Unspecified asthma, uncomplicated: Secondary | ICD-10-CM | POA: Diagnosis present

## 2020-01-10 DIAGNOSIS — Z853 Personal history of malignant neoplasm of breast: Secondary | ICD-10-CM | POA: Diagnosis not present

## 2020-01-10 DIAGNOSIS — D689 Coagulation defect, unspecified: Secondary | ICD-10-CM | POA: Diagnosis not present

## 2020-01-10 DIAGNOSIS — E785 Hyperlipidemia, unspecified: Secondary | ICD-10-CM | POA: Diagnosis present

## 2020-01-10 DIAGNOSIS — Z8249 Family history of ischemic heart disease and other diseases of the circulatory system: Secondary | ICD-10-CM | POA: Diagnosis not present

## 2020-01-10 DIAGNOSIS — M8580 Other specified disorders of bone density and structure, unspecified site: Secondary | ICD-10-CM | POA: Diagnosis present

## 2020-01-10 DIAGNOSIS — S62102D Fracture of unspecified carpal bone, left wrist, subsequent encounter for fracture with routine healing: Secondary | ICD-10-CM | POA: Diagnosis not present

## 2020-01-10 DIAGNOSIS — Z66 Do not resuscitate: Secondary | ICD-10-CM | POA: Diagnosis present

## 2020-01-10 DIAGNOSIS — Y92512 Supermarket, store or market as the place of occurrence of the external cause: Secondary | ICD-10-CM | POA: Diagnosis not present

## 2020-01-10 DIAGNOSIS — S42412A Displaced simple supracondylar fracture without intercondylar fracture of left humerus, initial encounter for closed fracture: Secondary | ICD-10-CM | POA: Diagnosis present

## 2020-01-10 DIAGNOSIS — S0240DA Maxillary fracture, left side, initial encounter for closed fracture: Secondary | ICD-10-CM | POA: Diagnosis present

## 2020-01-10 DIAGNOSIS — W109XXA Fall (on) (from) unspecified stairs and steps, initial encounter: Secondary | ICD-10-CM | POA: Diagnosis present

## 2020-01-10 DIAGNOSIS — S42352D Displaced comminuted fracture of shaft of humerus, left arm, subsequent encounter for fracture with routine healing: Secondary | ICD-10-CM | POA: Diagnosis not present

## 2020-01-10 DIAGNOSIS — K219 Gastro-esophageal reflux disease without esophagitis: Secondary | ICD-10-CM | POA: Diagnosis present

## 2020-01-10 DIAGNOSIS — W19XXXD Unspecified fall, subsequent encounter: Secondary | ICD-10-CM | POA: Diagnosis not present

## 2020-01-10 DIAGNOSIS — E876 Hypokalemia: Secondary | ICD-10-CM | POA: Diagnosis present

## 2020-01-10 DIAGNOSIS — S42352A Displaced comminuted fracture of shaft of humerus, left arm, initial encounter for closed fracture: Secondary | ICD-10-CM | POA: Diagnosis present

## 2020-01-10 DIAGNOSIS — Z823 Family history of stroke: Secondary | ICD-10-CM | POA: Diagnosis not present

## 2020-01-10 LAB — CBC
HCT: 33.8 % — ABNORMAL LOW (ref 36.0–46.0)
Hemoglobin: 11.2 g/dL — ABNORMAL LOW (ref 12.0–15.0)
MCH: 30.4 pg (ref 26.0–34.0)
MCHC: 33.1 g/dL (ref 30.0–36.0)
MCV: 91.6 fL (ref 80.0–100.0)
Platelets: 198 10*3/uL (ref 150–400)
RBC: 3.69 MIL/uL — ABNORMAL LOW (ref 3.87–5.11)
RDW: 12.8 % (ref 11.5–15.5)
WBC: 9.8 10*3/uL (ref 4.0–10.5)
nRBC: 0 % (ref 0.0–0.2)

## 2020-01-10 LAB — COMPREHENSIVE METABOLIC PANEL
ALT: 12 U/L (ref 0–44)
AST: 21 U/L (ref 15–41)
Albumin: 3.7 g/dL (ref 3.5–5.0)
Alkaline Phosphatase: 51 U/L (ref 38–126)
Anion gap: 9 (ref 5–15)
BUN: 12 mg/dL (ref 8–23)
CO2: 27 mmol/L (ref 22–32)
Calcium: 8.6 mg/dL — ABNORMAL LOW (ref 8.9–10.3)
Chloride: 101 mmol/L (ref 98–111)
Creatinine, Ser: 0.66 mg/dL (ref 0.44–1.00)
GFR, Estimated: >60 mL/min (ref >60)
Glucose, Bld: 92 mg/dL (ref 70–99)
Potassium: 3.4 mmol/L — ABNORMAL LOW (ref 3.5–5.1)
Sodium: 137 mmol/L (ref 135–145)
Total Bilirubin: 1.5 mg/dL — ABNORMAL HIGH (ref 0.3–1.2)
Total Protein: 6.6 g/dL (ref 6.5–8.1)

## 2020-01-10 MED ORDER — ATORVASTATIN CALCIUM 10 MG PO TABS
10.0000 mg | ORAL_TABLET | Freq: Every day | ORAL | Status: DC
  Administered 2020-01-11 – 2020-01-17 (×6): 10 mg via ORAL
  Filled 2020-01-10 (×7): qty 1

## 2020-01-10 MED ORDER — AMLODIPINE BESYLATE 5 MG PO TABS
5.0000 mg | ORAL_TABLET | Freq: Every day | ORAL | Status: DC
  Administered 2020-01-11 – 2020-01-17 (×6): 5 mg via ORAL
  Filled 2020-01-10 (×7): qty 1

## 2020-01-10 MED ORDER — METOPROLOL TARTRATE 25 MG PO TABS
25.0000 mg | ORAL_TABLET | Freq: Two times a day (BID) | ORAL | Status: DC
  Administered 2020-01-10 – 2020-01-17 (×14): 25 mg via ORAL
  Filled 2020-01-10 (×14): qty 1

## 2020-01-10 MED ORDER — LORAZEPAM 2 MG/ML IJ SOLN
2.0000 mg | Freq: Once | INTRAMUSCULAR | Status: AC
  Administered 2020-01-10: 2 mg via INTRAVENOUS
  Filled 2020-01-10: qty 1

## 2020-01-10 NOTE — Progress Notes (Signed)
[facility]  - [place] [facility]   PATIENT NAME: Laura David    MR#:  [id number]  DATE OF BIRTH:  10-13-1934  SUBJECTIVE:   Patient came in after she had a mechanical fall at home after she returned from grocery shopping. She had left side of her head. He is having significant pain in her left shoulder which is in splint and sling.  She is tolerating PO diet. Son in the ER room. Patient lives independently. Uses cane for extra help when she is outdoors. Karolee Ohs she is able to ambulate around without difficulty. No recent falls.  REVIEW OF SYSTEMS:   Review of Systems  Constitutional: Negative for chills, fever and weight loss.  HENT: Negative for ear discharge, ear pain and nosebleeds.   Eyes: Negative for blurred vision, pain and discharge.  Respiratory: Negative for sputum production, shortness of breath, wheezing and stridor.   Cardiovascular: Negative for chest pain, palpitations, orthopnea and PND.  Gastrointestinal: Negative for abdominal pain, diarrhea, nausea and vomiting.  Genitourinary: Negative for frequency and urgency.  Musculoskeletal: Negative for back pain and joint pain.  Neurological: Negative for sensory change, speech change, focal weakness and weakness.  Psychiatric/Behavioral: Negative for depression and hallucinations. The patient is not nervous/anxious.    Tolerating Diet:yes Tolerating PT: SNF  DRUG ALLERGIES:   Allergies  Allergen Reactions  . Flagyl [Metronidazole] Nausea Only  . Pacerone [Amiodarone]     Eye deposits  . Ramipril Cough    VITALS:  Blood pressure 138/76, pulse 99, temperature 97.7 F (36.5 C), temperature source Oral, resp. rate 15, height 5' (1.524 m), weight 45.4 kg, SpO2 96 %.  PHYSICAL EXAMINATION:   Physical Exam  GENERAL:  86 y.o.-year-old patient lying in the bed with acute distress with pain HEENT: left black eye NECK:  Supple, no jugular venous distention. No thyroid enlargement, no tenderness.   LUNGS: Normal breath sounds bilaterally, no wheezing, rales, rhonchi. No use of accessory muscles of respiration.  CARDIOVASCULAR: S1, S2 normal. No murmurs, rubs, or gallops.  ABDOMEN: Soft, nontender, nondistended. Bowel sounds present. No organomegaly or mass.  EXTREMITIES: left UE in sling/splint NEUROLOGIC: Cranial nerves II through XII are intact. No focal Motor or sensory deficits b/l.   PSYCHIATRIC:  patient is alert and oriented x 3.  SKIN: No obvious rash, lesion, or ulcer.   LABORATORY PANEL:  CBC Recent Labs  Lab 01/10/20 0439  WBC 9.8  HGB 11.2*  HCT 33.8*  PLT 198    Chemistries  Recent Labs  Lab 01/10/20 0439  NA 137  K 3.4*  CL 101  CO2 27  GLUCOSE 92  BUN 12  CREATININE 0.66  CALCIUM 8.6*  AST 21  ALT 12  ALKPHOS 51  BILITOT 1.5*   Cardiac Enzymes No results for input(s): TROPONINI in the last 168 hours. RADIOLOGY:  DG Chest 1 View  Result Date: 01/09/2020 CLINICAL DATA:  Fall earlier today, fell on left side, initial encounter. EXAM: CHEST  1 VIEW COMPARISON:  08/05/2009. FINDINGS: Trachea is midline. Heart is enlarged. Thoracic aorta is calcified. Biapical pleuroparenchymal scarring. Lungs are hyperinflated but clear. Osseous structures appear grossly intact. Degenerative changes in the right shoulder. IMPRESSION: 1. No acute findings. 2.  Aortic atherosclerosis (ICD10-I70.0). Electronically Signed   By: Leanna Battles M.D.   On: 01/09/2020 15:54   DG Elbow 2 Views Left  Result Date: 01/09/2020 CLINICAL DATA:  Pt reports that she slipped and fell in [employer] and landed on her left  side. Pt has known wrist fracture, and complains of pain up and down her left arm. Pt says the majority of her pain is under her armpit EXAM: LEFT ELBOW - 2 VIEW COMPARISON:  None. FINDINGS: No fracture or bone lesion. Elbow joint normally spaced and aligned.  No joint effusion. Soft tissues are unremarkable. IMPRESSION: Negative. Electronically Signed   By: Lajean Manes  M.D.   On: 01/09/2020 14:17   DG Wrist Complete Left  Result Date: 01/09/2020 CLINICAL DATA:  Pt reports that she was in [employer] when she tripped over something and landed on her left side. Pt states that she has left wrist pain at this time that radiates up her arm. Unsure if she hit anything on the way down or how she caught herself EXAM: LEFT WRIST - COMPLETE 3+ VIEW COMPARISON:  None. FINDINGS: Comminuted fracture of the distal radius with intra-articular component. A volar fracture component is displaced anteriorly by 3 mm. Mild impaction of 2-3 mm. Distal radial articular surface has slight dorsal angulation of 5-6 degrees. There is mild loss of radial inclination. Wrist joints are normally aligned. There is diffuse surrounding soft tissue swelling. IMPRESSION: 1. Comminuted, intra-articular fracture the distal radius as detailed. No dislocation. Electronically Signed   By: Lajean Manes M.D.   On: 01/09/2020 13:16   CT Head Wo Contrast  Result Date: 01/09/2020 CLINICAL DATA:  Follow-up. Patient fell at the grocery store. On blood thinners. CT obtained earlier today demonstrates a subdural hemorrhage along the right tentorium. EXAM: CT HEAD WITHOUT CONTRAST TECHNIQUE: Contiguous axial images were obtained from the base of the skull through the vertex without intravenous contrast. COMPARISON:  None. FINDINGS: Brain: Small subdural hemorrhage along the right tentorium is unchanged. No evidence of new intracranial hemorrhage. Ventricular and sulcal enlargement consistent with atrophy is stable. Patchy white matter hypoattenuation consistent with chronic microvascular ischemic changes stable. There are no parenchymal masses or mass effect, no midline shift, no evidence of ischemic infarct and no extra-axial masses. Vascular: No hyperdense vessel or unexpected calcification. Skull: No skull fracture or bone lesion. Sinuses/Orbits: Visualized globes and orbits are unremarkable. Small amount of dependent  secretions in the right sphenoid sinus dependent fluid in the left maxillary sinus. These findings are stable. Other: None. IMPRESSION: 1. No change from the earlier head CT. 2. Small acute subdural hematoma along the right tentorium. No new intracranial hemorrhage. Electronically Signed   By: Lajean Manes M.D.   On: 01/09/2020 19:34   CT Head Wo Contrast  Result Date: 01/09/2020 CLINICAL DATA:  Head trauma. Trip and fall. Left supraorbital hematoma. Patient is on blood thinners. EXAM: CT HEAD WITHOUT CONTRAST CT MAXILLOFACIAL WITHOUT CONTRAST TECHNIQUE: Multidetector CT imaging of the head and maxillofacial structures were performed using the standard protocol without intravenous contrast. Multiplanar CT image reconstructions of the maxillofacial structures were also generated. COMPARISON:  October 18, 2018. FINDINGS: CT HEAD FINDINGS Brain: Thin (3-4 mm thick) acute subdural hemorrhage layering along the right tentorial leaflet (series 4, image 45), new from prior. No evidence of acute infarction, hydrocephalus, extra-axial collection or mass lesion/mass effect. Patchy white matter hypoattenuation, most likely related to chronic microvascular ischemic disease. Similar generalized cerebral volume loss with ex vacuo ventricular dilation. Vascular: Calcific atherosclerosis. Skull: No acute fracture. Other: No mastoid effusions. CT MAXILLOFACIAL FINDINGS Osseous: Acute mildly depressed fracture of the left anterior maxillary sinus wall with nondisplaced extension through the posterior maxillary sinus wall Orbits: Globes are symmetric and within normal limits. No retro bulbar  hemorrhage. No evidence of acute orbital fracture. Sinuses: Left maxillary hemosinus. Frothy secretions in the right sphenoid sinus. Small right maxillary retention cyst. Soft tissues: Left periorbital and premaxillary soft tissue contusion. IMPRESSION: 1. Thin (3-4 mm thick) acute subdural hemorrhage layering along the right tentorial  leaflet. No substantial mass effect. 2. Left periorbital and premaxillary soft tissue contusion with acute mildly depressed fracture of the left anterior maxillary sinus wall with nondisplaced extension through the posterior maxillary sinus wall. Findings were discussed with Dr. Charna Archer At 1:16 p.m. via telephone. Electronically Signed   By: Margaretha Sheffield MD   On: 01/09/2020 13:20   CT Cervical Spine Wo Contrast  Result Date: 01/09/2020 CLINICAL DATA:  Neck trauma. Status post fall. LEFT orbital hematoma and subdural hemorrhage. EXAM: CT CERVICAL SPINE WITHOUT CONTRAST TECHNIQUE: Multidetector CT imaging of the cervical spine was performed without intravenous contrast. Multiplanar CT image reconstructions were also generated. COMPARISON:  CT of the head earlier today FINDINGS: Alignment: There is loss of cervical lordosis. Degenerative changes are identified at C5-6. There is 2 millimeters of anterolisthesis of C4 on C5. Otherwise alignment is normal. Skull base and vertebrae: No acute fracture. No primary bone lesion or focal pathologic process. Soft tissues and spinal canal: No prevertebral fluid or swelling. No visible canal hematoma. Disc levels: Disc height loss and uncovertebral spurring primarily at C5-6. Upper chest: Negative. Other: None. IMPRESSION: 1. No evidence for acute cervical spine abnormality. 2. Significant degenerative changes at C5-6. 3. Loss of cervical lordosis. Electronically Signed   By: Nolon Nations M.D.   On: 01/09/2020 14:04   DG Pelvis Portable  Result Date: 01/09/2020 CLINICAL DATA:  [place] at the grocery store earlier today onto her left side. Pain. EXAM: PORTABLE PELVIS 1-2 VIEWS COMPARISON:  None. FINDINGS: No fracture or bone lesion. Hip joints, SI joints and symphysis pubis are normally spaced and aligned. No significant arthropathic change. Soft tissues are unremarkable. There are disc degenerative changes of the lower lumbar spine. IMPRESSION: 1. No fracture or acute  finding.  No significant joint abnormality Electronically Signed   By: Lajean Manes M.D.   On: 01/09/2020 15:53   DG Shoulder Left  Result Date: 01/09/2020 CLINICAL DATA:  Slipped and fell landing on the left side. Left shoulder pain. EXAM: LEFT SHOULDER - 2+ VIEW COMPARISON:  None. FINDINGS: Comminuted fracture of the proximal humerus. There is a transverse fracture across the surgical neck with additional fracture across the base of the greater tuberosity. Tuberosity fracture component is mildly displaced, 5-6 mm laterally. There is no significant fracture angulation. Glenohumeral joint remains normally aligned. AC joint normally spaced and aligned. Skeletal structures are demineralized. IMPRESSION: 1. Mildly comminuted fracture of the proximal left humerus as described. No dislocation. Electronically Signed   By: Lajean Manes M.D.   On: 01/09/2020 14:16   CT MAXILLOFACIAL WO CONTRAST  Result Date: 01/09/2020 CLINICAL DATA:  Head trauma. Trip and fall. Left supraorbital hematoma. Patient is on blood thinners. EXAM: CT HEAD WITHOUT CONTRAST CT MAXILLOFACIAL WITHOUT CONTRAST TECHNIQUE: Multidetector CT imaging of the head and maxillofacial structures were performed using the standard protocol without intravenous contrast. Multiplanar CT image reconstructions of the maxillofacial structures were also generated. COMPARISON:  October 18, 2018. FINDINGS: CT HEAD FINDINGS Brain: Thin (3-4 mm thick) acute subdural hemorrhage layering along the right tentorial leaflet (series 4, image 45), new from prior. No evidence of acute infarction, hydrocephalus, extra-axial collection or mass lesion/mass effect. Patchy white matter hypoattenuation, most likely related to chronic microvascular  ischemic disease. Similar generalized cerebral volume loss with ex vacuo ventricular dilation. Vascular: Calcific atherosclerosis. Skull: No acute fracture. Other: No mastoid effusions. CT MAXILLOFACIAL FINDINGS Osseous: Acute mildly  depressed fracture of the left anterior maxillary sinus wall with nondisplaced extension through the posterior maxillary sinus wall Orbits: Globes are symmetric and within normal limits. No retro bulbar hemorrhage. No evidence of acute orbital fracture. Sinuses: Left maxillary hemosinus. Frothy secretions in the right sphenoid sinus. Small right maxillary retention cyst. Soft tissues: Left periorbital and premaxillary soft tissue contusion. IMPRESSION: 1. Thin (3-4 mm thick) acute subdural hemorrhage layering along the right tentorial leaflet. No substantial mass effect. 2. Left periorbital and premaxillary soft tissue contusion with acute mildly depressed fracture of the left anterior maxillary sinus wall with nondisplaced extension through the posterior maxillary sinus wall. Findings were discussed with Dr. Charna Archer At 1:16 p.m. via telephone. Electronically Signed   By: Margaretha Sheffield MD   On: 01/09/2020 13:20   ASSESSMENT AND PLAN:  ZHARA WANGER is a 86 y.o. female with medical history significant of breast cancer, GERD, hyperlipidemia, hypertension, paroxysmal atrial fibrillation on chronic warfarin therapy, basal cell carcinoma among other things who came in from home after sustaining a mechanical fall today.  Patient was brought by her son.  She was turning at home when she missed a step and fell on her left outstretched arm.  Mechanical fall with Mildly comminuted fracture of the proximal left humerus  Comminuted, intra-articular fracture the distal radius  -- continue PO and IV pain meds -- Dr. Rudene Christians orthopedic consulted. Per Dr. Rudene Christians he can perform surgery either Friday or Saturday. -- Continue left arm sling and splint -- per PT patient had a very hard time given not weight bearing status. Hoping patient can get surgery while she is in the hospital. --holding coumadin till final decision for ortho surgeries made. In any case Dr. Lacinda Axon neurosurgery recommends holding for one week.   multiple  fractures: Patient has left humeral and radial fractures as well as left maxillary fractures.     --supportive care and follow Dr Theodore Demark recommendation  hypokalemia: Replete potassium.  Chronic anticoagulation with history of paroxysmal atrial fibrillation  -- continue metoprolol -- holding Coumadin as below  subdural hemorrhage: post fall --with subdural hemorrhage hold Coumadin for one week per Dr. Lacinda Axon neurosurgery. Likely hemorrhage will not expand given where it is located. Continue to monitor neuro- sign symptoms. -- Came in with INR of 2.4 -- last dose Coumadin was on second January at 10 AM hopefully it will drift down   GERD:  PPIs.   essential hypertension: Controlled -- resume metoprolol and amlodipine   leukocytosis: No obvious cause. Appears reactive. White count is 9.8.  DVT prophylaxis: SCD Code Status:  DNR this was confirmed with patient and son in the ER Family Communication: Son at bedside in the emergency room Disposition Plan: To be determined Consults called: Dr. Lacinda Axon of neurosurgery, Dr. Danille Oppedisano/Dr. Rudene Christians orthopedics Admission status: Inpatient   Status is: Inpatient  Remains inpatient appropriate because:Ongoing active pain requiring inpatient pain management   Dispo: The patient is from: Home              Anticipated d/c is to: SNF              Anticipated d/c date is: > 3 days              Patient currently is not medically stable to d/c.  Patient came in with mechanical  fall has multiple fractures. She is going abatement monitor neurologically. Awaiting final decision with Dr. Rudene Christians for ortho fracture surgery.      TOTAL TIME TAKING CARE OF THIS PATIENT: 25 minutes.  >50% time spent on counselling and coordination of care  Note: This dictation was prepared with Dragon dictation along with smaller phrase technology. Any transcriptional errors that result from this process are unintentional.  Fritzi Mandes M.D    [facility]    CC: Primary care physician; [name], MDPatient ID: [name], female   DOB: 23-Aug-1934, 86 y.o.   MRN: [id number]

## 2020-01-10 NOTE — ED Notes (Addendum)
Pt given water at this time, confirmed placement of purwick

## 2020-01-10 NOTE — Progress Notes (Signed)
Patient ID: Laura David, female   DOB: 11-Nov-1934, 86 y.o.   MRN: [id number] patient had a rough evening and a long night with significant pain of her shoulder and wrist. She seems to be a bit confused trying to pull her cardiac leads and restless in bed. She is a bit tachycardic and blood pressure up likely due to pain and not resting well. Discussed with patient's nurse will give one time 2 milligrams IV Ativan dose hopefully she'll settle down and get some rest.

## 2020-01-10 NOTE — ED Notes (Signed)
Pt resting in bed at this time, no distress noted. VSS

## 2020-01-10 NOTE — ED Notes (Signed)
Pt now resting. Pt son thought pt had defecated but this RN checked and brief appeared dry with no BM present. Pt straightened out on stretcher and covered back up with blankets.

## 2020-01-10 NOTE — Evaluation (Signed)
Occupational Therapy Evaluation Patient Details Name: Laura David MRN: [id number] DOB: [birth year] Today's Date: 01/10/2020    History of Present Illness Pt admitted for acute subdural hemorrhage and L UE fractures following fall at grocery store. Per neuro-hemorrhage stable and cleared to work with therapy. History includes breast cancer, GERD, and HTN.   Clinical Impression   [name] was seen for OT evaluation this date. Prior to hospital admission, pt was active and independent. She endorses using a SPC for community distances, but otherwise is totally independent with ADL/IADL management. Pt lives alone in a 1-level home with 3 STE. Currently pt demonstrates impairments as described below (See OT problem list) which significantly functionally limit her ability to perform ADL/self-care tasks. She is pending formal orthopedics consult at time of OT evaluation so LUE treated as NWB t/o. Per follow up with orthopedic surgeon, pt to remain NWB in her LUE with her wrist splinted and arm in sling/immobilizer. She is significantly pain limited t/o her L hemi-body during OT session. Pt currently requires MIN A for bed-level self-feeding. MAX-TOTAL A for bed-level LB ADL management, and MOD-MAX A for bed level UB dressing tasks. She is unable to tolerate bed/functional mobility 2/2 pain this date. Pt would benefit from skilled OT services to address noted impairments and functional limitations (see below for any additional details) in order to maximize safety and independence while minimizing falls risk and caregiver burden. Upon hospital discharge, recommend STR to maximize pt safety and return to PLOF.      Follow Up Recommendations  SNF;Supervision/Assistance - 24 hour    Equipment Recommendations  3 in 1 bedside commode    Recommendations for Other Services       Precautions / Restrictions Precautions Precautions: Fall Precaution Comments: Pt pending formal ortho consult. Per secure chat  with Dr. Eliane Decree, pt should remain NWB in sling for the shoulder with limited to no RoM and NWB through wrist with splint. Required Braces or Orthoses: Sling;Splint/Cast Restrictions Weight Bearing Restrictions: Yes LUE Weight Bearing: Non weight bearing Other Position/Activity Restrictions: L radius fx and L humerus fx in splint      Mobility Bed Mobility Overal bed mobility: Needs Assistance Bed Mobility: Supine to Sit     Supine to sit: Mod assist     General bed mobility comments: Deferred 2/2 pt pain level this date. Requires Total A +2 for bed boost with nsg. Significantly pain limited.    Transfers                 General transfer comment: unable to attempt    Balance Overall balance assessment: History of Falls (Will continue to assess as mobility progresses.)                                         ADL either performed or assessed with clinical judgement   ADL Overall ADL's : Needs assistance/impaired Eating/Feeding: Bed level;Minimal assistance Eating/Feeding Details (indicate cue type and reason): Min A for assist with 2-handed self-feeding tasks. Pt is RUE dominant, and is able to use her RUE during session to hold cups, etc. Currently bed-level for all ADL management 2/2 pain. Grooming: Bed level;Wash/dry face;Supervision/safety;Set up Grooming Details (indicate cue type and reason): c RUE only. Upper Body Bathing: Moderate assistance;Maximal assistance;Sitting   Lower Body Bathing: Maximal assistance;Sitting/lateral leans   Upper Body Dressing : Sitting;Moderate assistance   Lower  Body Dressing: Maximal assistance;Sit to/from stand                 General ADL Comments: Pt significantly functionally limited by increased pain and decreased AROM/functional use of her LUE. She is currently bed-level for all ADL management 2/2 pain.     Vision Baseline Vision/History: Wears glasses Wears Glasses: At all times Patient Visual  Report: No change from baseline       Perception     Praxis      Pertinent Vitals/Pain Pain Assessment: 0-10 Pain Score: 7  Pain Location: L hemibody Pain Descriptors / Indicators: Aching;Constant;Discomfort;Guarding;Grimacing Pain Intervention(s): Limited activity within patient's tolerance;Monitored during session;Repositioned;Relaxation;Utilized relaxation techniques;Patient requesting pain meds-RN notified;RN gave pain meds during session     Hand Dominance Right   Extremity/Trunk Assessment Upper Extremity Assessment Upper Extremity Assessment: LUE deficits/detail;RUE deficits/detail RUE Deficits / Details: Pt very pain limited during session. Unable to tolerate much RUE AROM. Generally weak and tremulous, with grip strength grossly 3/5. LUE Deficits / Details: Per MD, pt to remain NWB in LUE with splint/sling in place at all times. Pending further ortho eval for potential surgical needs. LUE: Unable to fully assess due to pain;Unable to fully assess due to immobilization   Lower Extremity Assessment Lower Extremity Assessment: Generalized weakness   Cervical / Trunk Assessment Cervical / Trunk Assessment: Normal   Communication Communication Communication: No difficulties   Cognition Arousal/Alertness: Awake/alert;Lethargic Behavior During Therapy: WFL for tasks assessed/performed Overall Cognitive Status: Within Functional Limits for tasks assessed                                 General Comments: oriented x 4, minimally lethargic t/o session.   General Comments       Exercises Exercises: Other exercises Other Exercises Other Exercises: Pt/caregiver (son at bedside) educated on role of OT in acute setting, complementarly alterantive methods for pain management including: distraction and deep breathing techniques, as well as routines modifications to support safety and functional independence upon hospital DC.   Shoulder Instructions      Home  Living Family/patient expects to be discharged to:: Private residence Living Arrangements: Alone Available Help at Discharge: Family;Available PRN/intermittently (Son) Type of Home: House Home Access: Stairs to enter [employer] of Steps: 3 Entrance Stairs-Rails: Can reach both Home Layout: One level               Home Equipment: Cane - single point          Prior Functioning/Environment Level of Independence: Independent with assistive device(s)        Comments: Uses SPC for community distances, typically indep in home. Reports no recent falls.        OT Problem List: Decreased strength;Decreased coordination;Pain;Decreased activity tolerance;Decreased safety awareness;Decreased range of motion;Impaired balance (sitting and/or standing);Decreased knowledge of use of DME or AE;Impaired UE functional use;Decreased knowledge of precautions      OT Treatment/Interventions: Self-care/ADL training;Therapeutic exercise;Therapeutic activities;Energy conservation;DME and/or AE instruction;Patient/family education;Balance training    OT Goals(Current goals can be found in the care plan section) Acute Rehab OT Goals Patient Stated Goal: to have less pain OT Goal Formulation: With patient Time For Goal Achievement: 01/24/20 Potential to Achieve Goals: Good ADL Goals Pt Will Perform Grooming: sitting;with set-up;with supervision Pt Will Perform Upper Body Dressing: sitting;with min assist Pt Will Perform Lower Body Dressing: sit to/from stand;with min assist;with adaptive equipment (c LRAD PRN for improved  safety and functional indep.) Pt Will Transfer to Toilet: bedside commode;stand pivot transfer;with mod assist (c LRAD PRN for improved safety and functional indep.)  OT Frequency: Min 1X/week   Barriers to D/C: Inaccessible home environment;Decreased caregiver support          Co-evaluation              AM-PAC OT "6 Clicks" Daily Activity     Outcome  Measure Help from another person eating meals?: A Little Help from another person taking care of personal grooming?: A Lot Help from another person toileting, which includes using toliet, bedpan, or urinal?: A Lot Help from another person bathing (including washing, rinsing, drying)?: A Lot Help from another person to put on and taking off regular upper body clothing?: A Lot Help from another person to put on and taking off regular lower body clothing?: A Lot 6 Click Score: 13   End of Session Nurse Communication: Mobility status;Patient requests pain meds;Weight bearing status;Other (comment) (Recommend don pt sling prior to bed change/transfer to floor.)  Activity Tolerance: Patient limited by pain Patient left: in bed;with call bell/phone within reach;with family/visitor present;with nursing/sitter in room (In EDU gurney bed.)  OT Visit Diagnosis: Other abnormalities of gait and mobility (R26.89);Muscle weakness (generalized) (M62.81);Pain Pain - Right/Left: Left Pain - part of body: Shoulder;Arm;Hand;Leg (ribs)                Time: [id number] OT Time Calculation (min): 31 min Charges:  OT General Charges $OT Visit: 1 Visit OT Evaluation $OT Eval Moderate Complexity: 1 Mod OT Treatments $Self Care/Home Management : 8-22 mins  Shara Blazing, M.S., OTR/L Ascom: [phone] 01/10/20, 3:47 PM

## 2020-01-10 NOTE — ED Notes (Signed)
Pt drowsy, resting in bed. NO needs at this time. Pt family asking if patient were to be given IV fluids to prevent dehydration.

## 2020-01-10 NOTE — ED Notes (Signed)
Breakfast tray given at this time.  

## 2020-01-10 NOTE — Evaluation (Signed)
Physical Therapy Evaluation Patient Details Name: Laura David MRN: [id number] DOB: 1934-08-28 Today's Date: 01/10/2020   History of Present Illness  Pt admitted for acute subdural hemorrhage and L UE fractures following fall at grocery store. Per neuro-hemorrhage stable and cleared to work with therapy. History includes breast cancer, GERD, and HTN.  Clinical Impression  Pt is a pleasant 86 year old female who was admitted for subdural hemorrhage and L UE fractures. Pt performs bed mobility with mod assist and unable to further perform mobility at this time- due to pain. Pt demonstrates deficits with strength/pain/mobility/endurance. Pt is not at baseline at this time and isn't safe to return home. Recommend mobility trial with hemiwalker. Would benefit from skilled PT to address above deficits and promote optimal return to PLOF; recommend transition to STR upon discharge from acute hospitalization.      Follow Up Recommendations SNF    Equipment Recommendations   (TBD at next venue of care)    Recommendations for Other Services       Precautions / Restrictions Precautions Precautions: Fall Restrictions Weight Bearing Restrictions: Yes LUE Weight Bearing: Non weight bearing Other Position/Activity Restrictions: L radius fx and L humerus fx in splint      Mobility  Bed Mobility Overal bed mobility: Needs Assistance Bed Mobility: Supine to Sit     Supine to sit: Mod assist     General bed mobility comments: attempted supine->longsitting in bed. Pt using R railing to assist. Takes mod assist for upright posture, only able to tolerate for 2-3 seconds before crying in pain. Returned supine. Unable to further tolerate mobility efforts    Transfers                 General transfer comment: unable to attempt  Ambulation/Gait                Stairs            Wheelchair Mobility    Modified Rankin (Stroke Patients Only)       Balance Overall  balance assessment: History of Falls                                           Pertinent Vitals/Pain Pain Assessment: 0-10 Pain Score: 7  Pain Location: L hemibody Pain Descriptors / Indicators: Aching;Constant;Discomfort;Guarding;Grimacing Pain Intervention(s): Limited activity within patient's tolerance;Repositioned;Monitored during session    Home Living Family/patient expects to be discharged to:: Private residence Living Arrangements: Alone Available Help at Discharge: Family;Available PRN/intermittently (son) Type of Home: House Home Access: Stairs to enter Entrance Stairs-Rails: Can reach both Entrance Stairs-Number of Steps: 3 Home Layout: One level Home Equipment: Cane - single point      Prior Function Level of Independence: Independent with assistive device(s)         Comments: Uses SPC for community distances, typically indep in home. Reports no recent falls.     Hand Dominance        Extremity/Trunk Assessment   Upper Extremity Assessment Upper Extremity Assessment:  (unable to test L UE due to pain; noted edema; R UE grossly WNL)    Lower Extremity Assessment Lower Extremity Assessment: Generalized weakness (L LE painful-grossly 3/5; R LE grossly 4/5; bruising noted on L thigh)       Communication   Communication: No difficulties  Cognition Arousal/Alertness: Awake/alert Behavior During Therapy: WFL for tasks assessed/performed Overall  Cognitive Status: Within Functional Limits for tasks assessed                                 General Comments: oriented x 4, however does demonstrate lost of train of thought/idea      General Comments      Exercises     Assessment/Plan    PT Assessment Patient needs continued PT services  PT Problem List Decreased strength;Decreased balance;Decreased mobility;Decreased activity tolerance;Pain       PT Treatment Interventions DME instruction;Gait training;Therapeutic  activities;Therapeutic exercise;Balance training    PT Goals (Current goals can be found in the Care Plan section)  Acute Rehab PT Goals Patient Stated Goal: to have less pain PT Goal Formulation: With patient Time For Goal Achievement: 01/24/20 Potential to Achieve Goals: Fair    Frequency 7X/week   Barriers to discharge        Co-evaluation               AM-PAC PT "6 Clicks" Mobility  Outcome Measure Help needed turning from your back to your side while in a flat bed without using bedrails?: A Lot Help needed moving from lying on your back to sitting on the side of a flat bed without using bedrails?: Total Help needed moving to and from a bed to a chair (including a wheelchair)?: Total Help needed standing up from a chair using your arms (e.g., wheelchair or bedside chair)?: Total Help needed to walk in hospital room?: Total Help needed climbing 3-5 steps with a railing? : Total 6 Click Score: 7    End of Session   Activity Tolerance: Patient limited by pain Patient left: in bed;with family/visitor present Nurse Communication: Mobility status PT Visit Diagnosis: Muscle weakness (generalized) (M62.81);History of falling (Z91.81);Difficulty in walking, not elsewhere classified (R26.2);Pain Pain - Right/Left: Left Pain - part of body: Arm    Time: [id number] PT Time Calculation (min) (ACUTE ONLY): 34 min   Charges:   PT Evaluation $PT Eval Moderate Complexity: 1 Mod PT Treatments $Therapeutic Activity: 8-22 mins        Laura David, PT, DPT [phone]   Laura David 01/10/2020, 3:15 PM

## 2020-01-10 NOTE — ED Notes (Addendum)
Attempting to wake patient for Neuro Check. Pt very drowsy and difficult to arouse with verbal and painful stimuli. Pt able to respond with only "yes" as response once. Pt was given ativan IV and this RN is unsure if neuro status is changed from medication or from brain bleed. Messaging provider via secure chat.

## 2020-01-10 NOTE — ED Notes (Signed)
Into meet patient at shift change. Pt sleeping comfortably  In bed. Pt family member at bedside and verbalized no needs at this time.

## 2020-01-10 NOTE — ED Notes (Signed)
This RN walked into pt room d/t seeing her cardiac leads not attached on the monitor at the desk. Pt had ripped off cardiac leads and taken pulse ox off. Pt was kicking legs and trying to get out of bed. Son arrived at bedside and is attempting to calm pt down at this time. Spoke to Dr. Allena Katz and orders received.

## 2020-01-10 NOTE — ED Notes (Signed)
Pt called this RN to the room at this time. Pt c/o pain and nausea at this time. See order. PRN medication given.

## 2020-01-10 NOTE — ED Notes (Signed)
Pt pulled up in the bed at this time

## 2020-01-10 NOTE — ED Notes (Signed)
Pt to CT

## 2020-01-11 DIAGNOSIS — S42352D Displaced comminuted fracture of shaft of humerus, left arm, subsequent encounter for fracture with routine healing: Secondary | ICD-10-CM

## 2020-01-11 DIAGNOSIS — W19XXXD Unspecified fall, subsequent encounter: Secondary | ICD-10-CM | POA: Diagnosis not present

## 2020-01-11 DIAGNOSIS — S065X9A Traumatic subdural hemorrhage with loss of consciousness of unspecified duration, initial encounter: Secondary | ICD-10-CM | POA: Diagnosis not present

## 2020-01-11 DIAGNOSIS — D689 Coagulation defect, unspecified: Secondary | ICD-10-CM | POA: Diagnosis not present

## 2020-01-11 LAB — DIGOXIN LEVEL: Digoxin Level: <0.2 ng/mL — ABNORMAL LOW (ref 0.8–2.0)

## 2020-01-11 LAB — PROTIME-INR
INR: 3 — ABNORMAL HIGH (ref 0.8–1.2)
Prothrombin Time: 30.4 seconds — ABNORMAL HIGH (ref 11.4–15.2)

## 2020-01-11 NOTE — ED Notes (Signed)
ED TO INPATIENT HANDOFF REPORT  ED Nurse Name and Phone #: Zahid Carneiro [phone]  S Name/Age/Gender Laura David 86 y.o. female Room/Bed: ED31A/ED31A  Code Status   Code Status: DNR  Home/SNF/Other Home Patient oriented to: self, place, time and situation Is this baseline? Yes   Triage Complete: Triage complete  Chief Complaint Fall [W19.XXXA]  Triage Note Pt comes into the ED via [facility] from [facility], states she slipped and fell hitting her head, has a hematoma above the left eye and having left wrist pain. Pt is on coumadin     Allergies Allergies  Allergen Reactions  . Flagyl [Metronidazole] Nausea Only  . Pacerone [Amiodarone]     Eye deposits  . Ramipril Cough    Level of Care/Admitting Diagnosis ED Disposition    ED Disposition Condition Comment   Admit  Hospital Area: [facility] [[id number]]  Level of Care: Med-Surg [16]  Covid Evaluation: Asymptomatic Screening Protocol (No Symptoms)  Diagnosis: Fall [[id number]]  Admitting Physician: Rometta Emery [[id number]]  Attending Physician: Joselyn Glassman  Estimated length of stay: past midnight tomorrow  Certification:: I certify this patient will need inpatient services for at least 2 midnights       B Medical/Surgery History Past Medical History:  Diagnosis Date  . Asthma   . Breast cancer (HCC) 2010   RIGHT  . Cancer (HCC)    basel cell, s/p MOH'S/DR. DASHER  . GERD (gastroesophageal reflux disease) 06/12/11   EGD NORMAL  . Hx of mammogram 2014   NORMAL  . Hyperlipidemia   . Hypertension   . Osteopenia   . PAF (paroxysmal atrial fibrillation) (HCC)   . 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 ABDOMINAL HYSTERECTOMY  1980   D/T FIBROIDS  . BLADDER SUSPENSION  1983  . BREAST BIOPSY Left    benign   . BREAST SURGERY Right 2010   MASTECTOMY/CA  . COLONOSCOPY  2013   NORMAL  . MASTECTOMY Right 2010  . MOLES REMOVED  12/05/10    FROM [facility]     A IV Location/Drains/Wounds Patient Lines/Drains/Airways Status    Active Line/Drains/Airways    Name Placement date Placement time Site Days   Peripheral IV 01/09/20 Right Antecubital 01/09/20  1445  Antecubital  2          Intake/Output Last 24 hours  Intake/Output Summary (Last 24 hours) at 01/11/2020 0836 Last data filed at 01/10/2020 2134 Gross per 24 hour  Intake 1939.31 ml  Output 1550 ml  Net 389.31 ml    Labs/Imaging Results for orders placed or performed during the hospital encounter of 01/09/20 (from the past 48 hour(s))  Basic metabolic panel     Status: Abnormal   Collection Time: 01/09/20  2:45 PM  Result Value Ref Range   Sodium 140 135 - 145 mmol/L   Potassium 3.1 (L) 3.5 - 5.1 mmol/L   Chloride 101 98 - 111 mmol/L   CO2 23 22 - 32 mmol/L   Glucose, Bld 139 (H) 70 - 99 mg/dL    Comment: Glucose reference range applies only to samples taken after fasting for at least 8 hours.   BUN 17 8 - 23 mg/dL   Creatinine, Ser 3.14 0.44 - 1.00 mg/dL   Calcium 9.8 8.9 - 97.0 mg/dL   GFR, Estimated >26 >37 mL/min    Comment: (NOTE) Calculated using the CKD-EPI Creatinine Equation (2021)  Anion gap 16 (H) 5 - 15    Comment: Performed at [facility], [address] [place], [place] [address]  CBC with Differential     Status: Abnormal   Collection Time: 01/09/20  2:45 PM  Result Value Ref Range   WBC 17.6 (H) 4.0 - 10.5 K/uL   RBC 4.58 3.87 - 5.11 MIL/uL   Hemoglobin 13.8 12.0 - 15.0 g/dL   HCT 41.5 36.0 - 46.0 %   MCV 90.6 80.0 - 100.0 fL   MCH 30.1 26.0 - 34.0 pg   MCHC 33.3 30.0 - 36.0 g/dL   RDW 12.8 11.5 - 15.5 %   Platelets 251 150 - 400 K/uL   nRBC 0.0 0.0 - 0.2 %   Neutrophils Relative % 88 %   Neutro Abs 15.5 (H) 1.7 - 7.7 K/uL   Lymphocytes Relative 6 %   Lymphs Abs 1.0 0.7 - 4.0 K/uL   Monocytes Relative 6 %   Monocytes Absolute 1.0 0.1 - 1.0 K/uL   Eosinophils Relative 0 %   Eosinophils Absolute 0.0 0.0 - 0.5 K/uL    Basophils Relative 0 %   Basophils Absolute 0.1 0.0 - 0.1 K/uL   Immature Granulocytes 0 %   Abs Immature Granulocytes 0.07 0.00 - 0.07 K/uL    Comment: Performed at [facility], [address] [place], [place] [address]  Protime-INR     Status: Abnormal   Collection Time: 01/09/20  2:45 PM  Result Value Ref Range   Prothrombin Time 25.1 (H) 11.4 - 15.2 seconds   INR 2.4 (H) 0.8 - 1.2    Comment: (NOTE) INR goal varies based on device and disease states. Performed at [facility], [address] [place], [place] [address]   Hepatic function panel     Status: None   Collection Time: 01/09/20  2:45 PM  Result Value Ref Range   Total Protein 7.9 6.5 - 8.1 g/dL   Albumin 4.4 3.5 - 5.0 g/dL   AST 28 15 - 41 U/L   ALT 14 0 - 44 U/L   Alkaline Phosphatase 64 38 - 126 U/L   Total Bilirubin 1.1 0.3 - 1.2 mg/dL   Bilirubin, Direct 0.2 0.0 - 0.2 mg/dL   Indirect Bilirubin 0.9 0.3 - 0.9 mg/dL    Comment: Performed at [facility], [address] [place],  [address]  Resp Panel by RT-PCR (Flu A&B, Covid) Nasopharyngeal Swab     Status: None   Collection Time: 01/09/20  9:14 PM   Specimen: Nasopharyngeal Swab; Nasopharyngeal(NP) swabs in vial transport medium  Result Value Ref Range   SARS Coronavirus 2 by RT PCR NEGATIVE NEGATIVE    Comment: (NOTE) SARS-CoV-2 target nucleic acids are NOT DETECTED.  The SARS-CoV-2 RNA is generally detectable in upper respiratory specimens during the acute phase of infection. The lowest concentration of SARS-CoV-2 viral copies this assay can detect is 138 copies/mL. A negative result does not preclude SARS-Cov-2 infection and should not be used as the sole basis for treatment or other patient management decisions. A negative result may occur with  improper specimen collection/handling, submission of specimen other than nasopharyngeal swab, presence of viral mutation(s) within the areas targeted by this assay,  and inadequate number of viral copies(<138 copies/mL). A negative result must be combined with clinical observations, patient history, and epidemiological information. The expected result is Negative.  Fact Sheet for Patients:  EntrepreneurPulse.com.au  Fact Sheet for Healthcare Providers:  IncredibleEmployment.be  This  test is no t yet approved or cleared by the [place] and  has been authorized for detection and/or diagnosis of SARS-CoV-2 by FDA under an Emergency Use Authorization (EUA). This EUA will remain  in effect (meaning this test can be used) for the duration of the COVID-19 declaration under Section 564(b)(1) of the Act, 21 U.S.C.section 360bbb-3(b)(1), unless the authorization is terminated  or revoked sooner.       Influenza A by PCR NEGATIVE NEGATIVE   Influenza B by PCR NEGATIVE NEGATIVE    Comment: (NOTE) The Xpert Xpress SARS-CoV-2/FLU/RSV plus assay is intended as an aid in the diagnosis of influenza from Nasopharyngeal swab specimens and should not be used as a sole basis for treatment. Nasal washings and aspirates are unacceptable for Xpert Xpress SARS-CoV-2/FLU/RSV testing.  Fact Sheet for Patients: EntrepreneurPulse.com.au  Fact Sheet for Healthcare Providers: IncredibleEmployment.be  This test is not yet approved or cleared by the [place] FDA and has been authorized for detection and/or diagnosis of SARS-CoV-2 by FDA under an Emergency Use Authorization (EUA). This EUA will remain in effect (meaning this test can be used) for the duration of the COVID-19 declaration under Section 564(b)(1) of the Act, 21 U.S.C. section 360bbb-3(b)(1), unless the authorization is terminated or revoked.  Performed at [facility], [address] [place], [place] [address]   Comprehensive metabolic panel     Status: Abnormal   Collection Time: 01/10/20  4:39 AM  Result  Value Ref Range   Sodium 137 135 - 145 mmol/L   Potassium 3.4 (L) 3.5 - 5.1 mmol/L   Chloride 101 98 - 111 mmol/L   CO2 27 22 - 32 mmol/L   Glucose, Bld 92 70 - 99 mg/dL    Comment: Glucose reference range applies only to samples taken after fasting for at least 8 hours.   BUN 12 8 - 23 mg/dL   Creatinine, Ser 0.66 0.44 - 1.00 mg/dL   Calcium 8.6 (L) 8.9 - 10.3 mg/dL   Total Protein 6.6 6.5 - 8.1 g/dL   Albumin 3.7 3.5 - 5.0 g/dL   AST 21 15 - 41 U/L   ALT 12 0 - 44 U/L   Alkaline Phosphatase 51 38 - 126 U/L   Total Bilirubin 1.5 (H) 0.3 - 1.2 mg/dL   GFR, Estimated >60 >60 mL/min    Comment: (NOTE) Calculated using the CKD-EPI Creatinine Equation (2021)    Anion gap 9 5 - 15    Comment: Performed at [facility], [address] [place],  [address]  CBC     Status: Abnormal   Collection Time: 01/10/20  4:39 AM  Result Value Ref Range   WBC 9.8 4.0 - 10.5 K/uL   RBC 3.69 (L) 3.87 - 5.11 MIL/uL   Hemoglobin 11.2 (L) 12.0 - 15.0 g/dL   HCT 33.8 (L) 36.0 - 46.0 %   MCV 91.6 80.0 - 100.0 fL   MCH 30.4 26.0 - 34.0 pg   MCHC 33.1 30.0 - 36.0 g/dL   RDW 12.8 11.5 - 15.5 %   Platelets 198 150 - 400 K/uL   nRBC 0.0 0.0 - 0.2 %    Comment: Performed at [facility], [address] [place],  [address]  Protime-INR     Status: Abnormal   Collection Time: 01/11/20  3:25 AM  Result Value Ref Range   Prothrombin Time 30.4 (H) 11.4 - 15.2 seconds   INR 3.0 (H) 0.8 - 1.2  Comment: (NOTE) INR goal varies based on device and disease states. Performed at [facility], [address] [place], [place] [address]   Digoxin level     Status: Abnormal   Collection Time: 01/11/20  3:25 AM  Result Value Ref Range   Digoxin Level <0.2 (L) 0.8 - 2.0 ng/mL    Comment: Performed at [facility], [address] [place], [place] [address]   DG Chest 1 View  Result Date: 01/09/2020 CLINICAL DATA:  Fall earlier today, fell on left side,  initial encounter. EXAM: CHEST  1 VIEW COMPARISON:  08/05/2009. FINDINGS: Trachea is midline. Heart is enlarged. Thoracic aorta is calcified. Biapical pleuroparenchymal scarring. Lungs are hyperinflated but clear. Osseous structures appear grossly intact. Degenerative changes in the right shoulder. IMPRESSION: 1. No acute findings. 2.  Aortic atherosclerosis (ICD10-I70.0). Electronically Signed   By: Leanna BattlesMelinda  Blietz M.D.   On: 01/09/2020 15:54   DG Elbow 2 Views Left  Result Date: 01/09/2020 CLINICAL DATA:  Pt reports that she slipped and fell in [employer] and landed on her left side. Pt has known wrist fracture, and complains of pain up and down her left arm. Pt says the majority of her pain is under her armpit EXAM: LEFT ELBOW - 2 VIEW COMPARISON:  None. FINDINGS: No fracture or bone lesion. Elbow joint normally spaced and aligned.  No joint effusion. Soft tissues are unremarkable. IMPRESSION: Negative. Electronically Signed   By: Amie Portlandavid  Ormond M.D.   On: 01/09/2020 14:17   DG Wrist Complete Left  Result Date: 01/09/2020 CLINICAL DATA:  Pt reports that she was in [employer] when she tripped over something and landed on her left side. Pt states that she has left wrist pain at this time that radiates up her arm. Unsure if she hit anything on the way down or how she caught herself EXAM: LEFT WRIST - COMPLETE 3+ VIEW COMPARISON:  None. FINDINGS: Comminuted fracture of the distal radius with intra-articular component. A volar fracture component is displaced anteriorly by 3 mm. Mild impaction of 2-3 mm. Distal radial articular surface has slight dorsal angulation of 5-6 degrees. There is mild loss of radial inclination. Wrist joints are normally aligned. There is diffuse surrounding soft tissue swelling. IMPRESSION: 1. Comminuted, intra-articular fracture the distal radius as detailed. No dislocation. Electronically Signed   By: Amie Portlandavid  Ormond M.D.   On: 01/09/2020 13:16   CT HEAD WO CONTRAST  Result Date:  01/10/2020 CLINICAL DATA:  Subdural hemorrhage, follow-up EXAM: CT HEAD WITHOUT CONTRAST TECHNIQUE: Contiguous axial images were obtained from the base of the skull through the vertex without intravenous contrast. COMPARISON:  01/09/2020 FINDINGS: Brain: Small volume subdural hemorrhage is again identified along the posterior falx and right tentorial leaflet. No significant change allowing for some interval redistribution. No new hemorrhage. Ventricles are stable in size. There are stable findings of chronic microvascular ischemic changes. Gray-white differentiation is preserved. Vascular: No new findings. Skull: No new findings. Sinuses/Orbits: No acute finding. Other: None. IMPRESSION: No substantial change in small volume subdural hemorrhage. No new hemorrhage. Electronically Signed   By: Guadlupe SpanishPraneil  Patel M.D.   On: 01/10/2020 21:58   CT Head Wo Contrast  Result Date: 01/09/2020 CLINICAL DATA:  Follow-up. Patient fell at the grocery store. On blood thinners. CT obtained earlier today demonstrates a subdural hemorrhage along the right tentorium. EXAM: CT HEAD WITHOUT CONTRAST TECHNIQUE: Contiguous axial images were obtained from the base of the skull through the vertex without intravenous contrast. COMPARISON:  None. FINDINGS: Brain: Small subdural hemorrhage along the right tentorium is unchanged. No evidence of new intracranial hemorrhage. Ventricular and sulcal enlargement consistent with atrophy is stable. Patchy white matter hypoattenuation consistent with chronic microvascular ischemic changes stable. There are no parenchymal masses or mass effect, no midline shift, no evidence of ischemic infarct and no extra-axial masses. Vascular: No hyperdense vessel or unexpected calcification. Skull: No skull fracture or bone lesion. Sinuses/Orbits: Visualized globes and orbits are unremarkable. Small amount of dependent secretions in the right sphenoid sinus dependent fluid in the left maxillary sinus. These findings  are stable. Other: None. IMPRESSION: 1. No change from the earlier head CT. 2. Small acute subdural hematoma along the right tentorium. No new intracranial hemorrhage. Electronically Signed   By: Amie Portland M.D.   On: 01/09/2020 19:34   CT Head Wo Contrast  Result Date: 01/09/2020 CLINICAL DATA:  Head trauma. Trip and fall. Left supraorbital hematoma. Patient is on blood thinners. EXAM: CT HEAD WITHOUT CONTRAST CT MAXILLOFACIAL WITHOUT CONTRAST TECHNIQUE: Multidetector CT imaging of the head and maxillofacial structures were performed using the standard protocol without intravenous contrast. Multiplanar CT image reconstructions of the maxillofacial structures were also generated. COMPARISON:  October 18, 2018. FINDINGS: CT HEAD FINDINGS Brain: Thin (3-4 mm thick) acute subdural hemorrhage layering along the right tentorial leaflet (series 4, image 45), new from prior. No evidence of acute infarction, hydrocephalus, extra-axial collection or mass lesion/mass effect. Patchy white matter hypoattenuation, most likely related to chronic microvascular ischemic disease. Similar generalized cerebral volume loss with ex vacuo ventricular dilation. Vascular: Calcific atherosclerosis. Skull: No acute fracture. Other: No mastoid effusions. CT MAXILLOFACIAL FINDINGS Osseous: Acute mildly depressed fracture of the left anterior maxillary sinus wall with nondisplaced extension through the posterior maxillary sinus wall Orbits: Globes are symmetric and within normal limits. No retro bulbar hemorrhage. No evidence of acute orbital fracture. Sinuses: Left maxillary hemosinus. Frothy secretions in the right sphenoid sinus. Small right maxillary retention cyst. Soft tissues: Left periorbital and premaxillary soft tissue contusion. IMPRESSION: 1. Thin (3-4 mm thick) acute subdural hemorrhage layering along the right tentorial leaflet. No substantial mass effect. 2. Left periorbital and premaxillary soft tissue contusion with acute  mildly depressed fracture of the left anterior maxillary sinus wall with nondisplaced extension through the posterior maxillary sinus wall. Findings were discussed with Dr. Larinda Buttery At 1:16 p.m. via telephone. Electronically Signed   By: Feliberto Harts MD   On: 01/09/2020 13:20   CT Cervical Spine Wo Contrast  Result Date: 01/09/2020 CLINICAL DATA:  Neck trauma. Status post fall. LEFT orbital hematoma and subdural hemorrhage. EXAM: CT CERVICAL SPINE WITHOUT CONTRAST TECHNIQUE: Multidetector CT imaging of the cervical spine was performed without intravenous contrast. Multiplanar CT image reconstructions were also generated. COMPARISON:  CT of the head earlier today FINDINGS: Alignment: There is loss of cervical lordosis. Degenerative changes are identified at C5-6. There is 2 millimeters of anterolisthesis of C4 on C5. Otherwise alignment is normal. Skull base and vertebrae: No acute fracture. No primary bone lesion or focal pathologic process. Soft tissues and spinal canal: No prevertebral fluid or swelling. No visible canal hematoma. Disc levels: Disc height loss and uncovertebral spurring primarily at C5-6. Upper chest: Negative. Other: None. IMPRESSION: 1. No evidence for acute cervical spine abnormality. 2. Significant degenerative changes at C5-6. 3. Loss of cervical lordosis. Electronically Signed   By: Norva Pavlov M.D.   On: 01/09/2020 14:04   DG Pelvis Portable  Result Date: 01/09/2020 CLINICAL DATA:  Larey Seat at  the grocery store earlier today onto her left side. Pain. EXAM: PORTABLE PELVIS 1-2 VIEWS COMPARISON:  None. FINDINGS: No fracture or bone lesion. Hip joints, SI joints and symphysis pubis are normally spaced and aligned. No significant arthropathic change. Soft tissues are unremarkable. There are disc degenerative changes of the lower lumbar spine. IMPRESSION: 1. No fracture or acute finding.  No significant joint abnormality Electronically Signed   By: Lajean Manes M.D.   On: 01/09/2020  15:53   DG Shoulder Left  Result Date: 01/09/2020 CLINICAL DATA:  Slipped and fell landing on the left side. Left shoulder pain. EXAM: LEFT SHOULDER - 2+ VIEW COMPARISON:  None. FINDINGS: Comminuted fracture of the proximal humerus. There is a transverse fracture across the surgical neck with additional fracture across the base of the greater tuberosity. Tuberosity fracture component is mildly displaced, 5-6 mm laterally. There is no significant fracture angulation. Glenohumeral joint remains normally aligned. AC joint normally spaced and aligned. Skeletal structures are demineralized. IMPRESSION: 1. Mildly comminuted fracture of the proximal left humerus as described. No dislocation. Electronically Signed   By: Lajean Manes M.D.   On: 01/09/2020 14:16   CT MAXILLOFACIAL WO CONTRAST  Result Date: 01/09/2020 CLINICAL DATA:  Head trauma. Trip and fall. Left supraorbital hematoma. Patient is on blood thinners. EXAM: CT HEAD WITHOUT CONTRAST CT MAXILLOFACIAL WITHOUT CONTRAST TECHNIQUE: Multidetector CT imaging of the head and maxillofacial structures were performed using the standard protocol without intravenous contrast. Multiplanar CT image reconstructions of the maxillofacial structures were also generated. COMPARISON:  October 18, 2018. FINDINGS: CT HEAD FINDINGS Brain: Thin (3-4 mm thick) acute subdural hemorrhage layering along the right tentorial leaflet (series 4, image 45), new from prior. No evidence of acute infarction, hydrocephalus, extra-axial collection or mass lesion/mass effect. Patchy white matter hypoattenuation, most likely related to chronic microvascular ischemic disease. Similar generalized cerebral volume loss with ex vacuo ventricular dilation. Vascular: Calcific atherosclerosis. Skull: No acute fracture. Other: No mastoid effusions. CT MAXILLOFACIAL FINDINGS Osseous: Acute mildly depressed fracture of the left anterior maxillary sinus wall with nondisplaced extension through the  posterior maxillary sinus wall Orbits: Globes are symmetric and within normal limits. No retro bulbar hemorrhage. No evidence of acute orbital fracture. Sinuses: Left maxillary hemosinus. Frothy secretions in the right sphenoid sinus. Small right maxillary retention cyst. Soft tissues: Left periorbital and premaxillary soft tissue contusion. IMPRESSION: 1. Thin (3-4 mm thick) acute subdural hemorrhage layering along the right tentorial leaflet. No substantial mass effect. 2. Left periorbital and premaxillary soft tissue contusion with acute mildly depressed fracture of the left anterior maxillary sinus wall with nondisplaced extension through the posterior maxillary sinus wall. Findings were discussed with Dr. Charna Archer At 1:16 p.m. via telephone. Electronically Signed   By: Margaretha Sheffield MD   On: 01/09/2020 13:20    Pending Labs Unresulted Labs (From admission, onward)         None      Vitals/Pain Today's Vitals   01/11/20 0530 01/11/20 0600 01/11/20 0630 01/11/20 0630  BP: 132/71 138/72 118/71   Pulse: 78 71 73   Resp: 15 15 15    Temp:      TempSrc:      SpO2: 97% 97% 97%   Weight:      Height:      PainSc:    Asleep    Isolation Precautions No active isolations  Medications Medications  lidocaine (LIDODERM) 5 % 1 patch (1 patch Transdermal Patch Removed 01/11/20 0248)  oxyCODONE (Oxy IR/ROXICODONE) immediate release  tablet 5 mg (5 mg Oral Given 01/11/20 0321)  acetaminophen (TYLENOL) tablet 650 mg (650 mg Oral Given 01/11/20 0321)    Or  acetaminophen (TYLENOL) suppository 650 mg ( Rectal See Alternative 01/11/20 0321)  morphine 2 MG/ML injection 2 mg (2 mg Intravenous Given 01/10/20 0815)  ondansetron (ZOFRAN) tablet 4 mg ( Oral See Alternative 01/10/20 0815)    Or  ondansetron (ZOFRAN) injection 4 mg (4 mg Intravenous Given 01/10/20 0815)  amLODipine (NORVASC) tablet 5 mg (0 mg Oral Hold 01/10/20 1753)  atorvastatin (LIPITOR) tablet 10 mg (0 mg Oral Hold 01/10/20 1754)  metoprolol  tartrate (LOPRESSOR) tablet 25 mg (25 mg Oral Given 01/10/20 2210)  morphine 4 MG/ML injection 4 mg (4 mg Intravenous Given 01/09/20 1446)  potassium chloride SA (KLOR-CON) CR tablet 40 mEq (40 mEq Oral Given 01/09/20 1624)  acetaminophen (TYLENOL) tablet 1,000 mg (1,000 mg Oral Given 01/09/20 1623)  LORazepam (ATIVAN) injection 2 mg (2 mg Intravenous Given 01/10/20 1742)    Mobility walks with person assist High fall risk   Focused Assessments Cardiac Assessment Handoff:  Cardiac Rhythm: Atrial fibrillation No results found for: CKTOTAL, CKMB, CKMBINDEX, TROPONINI No results found for: DDIMER Does the Patient currently have chest pain? No   , Neuro Assessment Handoff:  Swallow screen pass? Yes  Cardiac Rhythm: Atrial fibrillation       Neuro Assessment: Within Defined Limits Neuro Checks:      Last Documented NIHSS Modified Score:   Has TPA been given? No If patient is a Neuro Trauma and patient is going to OR before floor call report to [facility] nurse: [phone] or [phone]     R Recommendations: See Admitting Provider Note  Report given to:   Additional Notes:

## 2020-01-11 NOTE — Consult Note (Signed)
Reason for Consult: Left proximal humerus and left distal radius fractures Referring Physician: Dr. Dianne Dun Laura David is an 86 y.o. female.  HPI: Patient is normally independent 86 year old female who suffered a fall day of admission weeks with extensive injuries.  In particular with her anticoagulation she has extensive ecchymosis on the left side of her face.  She had comminuted fracture of the left wrist and left proximal humerus fracture.  Past Medical History:  Diagnosis Date  . Asthma   . Breast cancer ([place]) 2010   RIGHT  . Cancer ([facility])    basel cell, s/p MOH'S/DR. DASHER  . GERD (gastroesophageal reflux disease) 06/12/11   EGD NORMAL  . Hx of mammogram 2014   NORMAL  . Hyperlipidemia   . Hypertension   . Osteopenia   . PAF (paroxysmal atrial fibrillation) ([place])   . 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 ABDOMINAL HYSTERECTOMY  1980   D/T FIBROIDS  . BLADDER SUSPENSION  1983  . BREAST BIOPSY Left    benign   . BREAST SURGERY Right 2010   MASTECTOMY/CA  . COLONOSCOPY  2013   NORMAL  . MASTECTOMY Right 2010  . MOLES REMOVED  12/05/10   FROM [facility]    Family History  Problem Relation Age of Onset  . Heart disease Mother   . Stroke Mother   . Cancer Father        LUNG  . Diabetes Brother   . Heart disease Brother   . Hyperlipidemia Brother   . Hypertension Brother   . Diabetes Brother   . Heart disease Brother   . Hyperlipidemia Brother   . Hypertension Brother   . Breast cancer Neg Hx     Social History:  reports that she has never smoked. She has never used smokeless tobacco. She reports previous drug use. She reports that she does not drink alcohol.  Allergies:  Allergies  Allergen Reactions  . Flagyl [Metronidazole] Nausea Only  . Pacerone [Amiodarone]     Eye deposits  . Ramipril Cough    Medications: I have reviewed the patient's current medications.  Results for orders placed  or performed during the hospital encounter of 01/09/20 (from the past 48 hour(s))  Basic metabolic panel     Status: Abnormal   Collection Time: 01/09/20  2:45 PM  Result Value Ref Range   Sodium 140 135 - 145 mmol/L   Potassium 3.1 (L) 3.5 - 5.1 mmol/L   Chloride 101 98 - 111 mmol/L   CO2 23 22 - 32 mmol/L   Glucose, Bld 139 (H) 70 - 99 mg/dL    Comment: Glucose reference range applies only to samples taken after fasting for at least 8 hours.   BUN 17 8 - 23 mg/dL   Creatinine, Ser 0.83 0.44 - 1.00 mg/dL   Calcium 9.8 8.9 - 10.3 mg/dL   GFR, Estimated >60 >60 mL/min    Comment: (NOTE) Calculated using the CKD-EPI Creatinine Equation (2021)    Anion gap 16 (H) 5 - 15    Comment: Performed at [facility], [address] [place], [place] [address]  CBC with Differential     Status: Abnormal   Collection Time: 01/09/20  2:45 PM  Result Value Ref Range   WBC 17.6 (H) 4.0 - 10.5 K/uL   RBC 4.58 3.87 - 5.11 MIL/uL   Hemoglobin 13.8 12.0 - 15.0 g/dL  HCT 41.5 36.0 - 46.0 %   MCV 90.6 80.0 - 100.0 fL   MCH 30.1 26.0 - 34.0 pg   MCHC 33.3 30.0 - 36.0 g/dL   RDW 12.8 11.5 - 15.5 %   Platelets 251 150 - 400 K/uL   nRBC 0.0 0.0 - 0.2 %   Neutrophils Relative % 88 %   Neutro Abs 15.5 (H) 1.7 - 7.7 K/uL   Lymphocytes Relative 6 %   Lymphs Abs 1.0 0.7 - 4.0 K/uL   Monocytes Relative 6 %   Monocytes Absolute 1.0 0.1 - 1.0 K/uL   Eosinophils Relative 0 %   Eosinophils Absolute 0.0 0.0 - 0.5 K/uL   Basophils Relative 0 %   Basophils Absolute 0.1 0.0 - 0.1 K/uL   Immature Granulocytes 0 %   Abs Immature Granulocytes 0.07 0.00 - 0.07 K/uL    Comment: Performed at [facility], [address] [place], [place] [address]  Protime-INR     Status: Abnormal   Collection Time: 01/09/20  2:45 PM  Result Value Ref Range   Prothrombin Time 25.1 (H) 11.4 - 15.2 seconds   INR 2.4 (H) 0.8 - 1.2    Comment: (NOTE) INR goal varies based on device and disease states. Performed  at [facility], [address] [place], [place] [address]   Hepatic function panel     Status: None   Collection Time: 01/09/20  2:45 PM  Result Value Ref Range   Total Protein 7.9 6.5 - 8.1 g/dL   Albumin 4.4 3.5 - 5.0 g/dL   AST 28 15 - 41 U/L   ALT 14 0 - 44 U/L   Alkaline Phosphatase 64 38 - 126 U/L   Total Bilirubin 1.1 0.3 - 1.2 mg/dL   Bilirubin, Direct 0.2 0.0 - 0.2 mg/dL   Indirect Bilirubin 0.9 0.3 - 0.9 mg/dL    Comment: Performed at [facility], [address] [place], [place] [address]  Resp Panel by RT-PCR (Flu A&B, Covid) Nasopharyngeal Swab     Status: None   Collection Time: 01/09/20  9:14 PM   Specimen: Nasopharyngeal Swab; Nasopharyngeal(NP) swabs in vial transport medium  Result Value Ref Range   SARS Coronavirus 2 by RT PCR NEGATIVE NEGATIVE    Comment: (NOTE) SARS-CoV-2 target nucleic acids are NOT DETECTED.  The SARS-CoV-2 RNA is generally detectable in upper respiratory specimens during the acute phase of infection. The lowest concentration of SARS-CoV-2 viral copies this assay can detect is 138 copies/mL. A negative result does not preclude SARS-Cov-2 infection and should not be used as the sole basis for treatment or other patient management decisions. A negative result may occur with  improper specimen collection/handling, submission of specimen other than nasopharyngeal swab, presence of viral mutation(s) within the areas targeted by this assay, and inadequate number of viral copies(<138 copies/mL). A negative result must be combined with clinical observations, patient history, and epidemiological information. The expected result is Negative.  Fact Sheet for Patients:  EntrepreneurPulse.com.au  Fact Sheet for Healthcare Providers:  IncredibleEmployment.be  This test is no t yet approved or cleared by the [place] FDA and  has been authorized for detection and/or diagnosis of  SARS-CoV-2 by FDA under an Emergency Use Authorization (EUA). This EUA will remain  in effect (meaning this test can be used) for the duration of the COVID-19 declaration under Section 564(b)(1) of the Act, 21 U.S.C.section 360bbb-3(b)(1), unless the authorization is terminated  or revoked sooner.  Influenza A by PCR NEGATIVE NEGATIVE   Influenza B by PCR NEGATIVE NEGATIVE    Comment: (NOTE) The Xpert Xpress SARS-CoV-2/FLU/RSV plus assay is intended as an aid in the diagnosis of influenza from Nasopharyngeal swab specimens and should not be used as a sole basis for treatment. Nasal washings and aspirates are unacceptable for Xpert Xpress SARS-CoV-2/FLU/RSV testing.  Fact Sheet for Patients: BloggerCourse.com  Fact Sheet for Healthcare Providers: SeriousBroker.it  This test is not yet approved or cleared by the [place] FDA and has been authorized for detection and/or diagnosis of SARS-CoV-2 by FDA under an Emergency Use Authorization (EUA). This EUA will remain in effect (meaning this test can be used) for the duration of the COVID-19 declaration under Section 564(b)(1) of the Act, 21 U.S.C. section 360bbb-3(b)(1), unless the authorization is terminated or revoked.  Performed at [facility], [address] [place], [place] [address]   Comprehensive metabolic panel     Status: Abnormal   Collection Time: 01/10/20  4:39 AM  Result Value Ref Range   Sodium 137 135 - 145 mmol/L   Potassium 3.4 (L) 3.5 - 5.1 mmol/L   Chloride 101 98 - 111 mmol/L   CO2 27 22 - 32 mmol/L   Glucose, Bld 92 70 - 99 mg/dL    Comment: Glucose reference range applies only to samples taken after fasting for at least 8 hours.   BUN 12 8 - 23 mg/dL   Creatinine, Ser 0.17 0.44 - 1.00 mg/dL   Calcium 8.6 (L) 8.9 - 10.3 mg/dL   Total Protein 6.6 6.5 - 8.1 g/dL   Albumin 3.7 3.5 - 5.0 g/dL   AST 21 15 - 41 U/L   ALT 12 0 - 44 U/L    Alkaline Phosphatase 51 38 - 126 U/L   Total Bilirubin 1.5 (H) 0.3 - 1.2 mg/dL   GFR, Estimated >49 >44 mL/min    Comment: (NOTE) Calculated using the CKD-EPI Creatinine Equation (2021)    Anion gap 9 5 - 15    Comment: Performed at [facility], [address] [place], [place] [address]  CBC     Status: Abnormal   Collection Time: 01/10/20  4:39 AM  Result Value Ref Range   WBC 9.8 4.0 - 10.5 K/uL   RBC 3.69 (L) 3.87 - 5.11 MIL/uL   Hemoglobin 11.2 (L) 12.0 - 15.0 g/dL   HCT 16.3 (L) 84.6 - 65.9 %   MCV 91.6 80.0 - 100.0 fL   MCH 30.4 26.0 - 34.0 pg   MCHC 33.1 30.0 - 36.0 g/dL   RDW 93.5 70.1 - 77.9 %   Platelets 198 150 - 400 K/uL   nRBC 0.0 0.0 - 0.2 %    Comment: Performed at [facility], [address] [place], [place] [address]  Protime-INR     Status: Abnormal   Collection Time: 01/11/20  3:25 AM  Result Value Ref Range   Prothrombin Time 30.4 (H) 11.4 - 15.2 seconds   INR 3.0 (H) 0.8 - 1.2    Comment: (NOTE) INR goal varies based on device and disease states. Performed at [facility], [address] [place], [place] [address]   Digoxin level     Status: Abnormal   Collection Time: 01/11/20  3:25 AM  Result Value Ref Range   Digoxin Level <0.2 (L) 0.8 - 2.0 ng/mL    Comment: Performed at [facility], [address], [place], [place] [address]  DG Chest 1 View  Result Date: 01/09/2020 CLINICAL DATA:  Fall earlier today, fell on left side, initial encounter. EXAM: CHEST  1 VIEW COMPARISON:  08/05/2009. FINDINGS: Trachea is midline. Heart is enlarged. Thoracic aorta is calcified. Biapical pleuroparenchymal scarring. Lungs are hyperinflated but clear. Osseous structures appear grossly intact. Degenerative changes in the right shoulder. IMPRESSION: 1. No acute findings. 2.  Aortic atherosclerosis (ICD10-I70.0). Electronically Signed   By: Lorin Picket M.D.   On: 01/09/2020 15:54   DG Elbow 2 Views Left  Result Date:  01/09/2020 CLINICAL DATA:  Pt reports that she slipped and fell in [employer] and landed on her left side. Pt has known wrist fracture, and complains of pain up and down her left arm. Pt says the majority of her pain is under her armpit EXAM: LEFT ELBOW - 2 VIEW COMPARISON:  None. FINDINGS: No fracture or bone lesion. Elbow joint normally spaced and aligned.  No joint effusion. Soft tissues are unremarkable. IMPRESSION: Negative. Electronically Signed   By: Lajean Manes M.D.   On: 01/09/2020 14:17   DG Wrist Complete Left  Result Date: 01/09/2020 CLINICAL DATA:  Pt reports that she was in [employer] when she tripped over something and landed on her left side. Pt states that she has left wrist pain at this time that radiates up her arm. Unsure if she hit anything on the way down or how she caught herself EXAM: LEFT WRIST - COMPLETE 3+ VIEW COMPARISON:  None. FINDINGS: Comminuted fracture of the distal radius with intra-articular component. A volar fracture component is displaced anteriorly by 3 mm. Mild impaction of 2-3 mm. Distal radial articular surface has slight dorsal angulation of 5-6 degrees. There is mild loss of radial inclination. Wrist joints are normally aligned. There is diffuse surrounding soft tissue swelling. IMPRESSION: 1. Comminuted, intra-articular fracture the distal radius as detailed. No dislocation. Electronically Signed   By: Lajean Manes M.D.   On: 01/09/2020 13:16   CT HEAD WO CONTRAST  Result Date: 01/10/2020 CLINICAL DATA:  Subdural hemorrhage, follow-up EXAM: CT HEAD WITHOUT CONTRAST TECHNIQUE: Contiguous axial images were obtained from the base of the skull through the vertex without intravenous contrast. COMPARISON:  01/09/2020 FINDINGS: Brain: Small volume subdural hemorrhage is again identified along the posterior falx and right tentorial leaflet. No significant change allowing for some interval redistribution. No new hemorrhage. Ventricles are stable in size. There are stable  findings of chronic microvascular ischemic changes. Gray-white differentiation is preserved. Vascular: No new findings. Skull: No new findings. Sinuses/Orbits: No acute finding. Other: None. IMPRESSION: No substantial change in small volume subdural hemorrhage. No new hemorrhage. Electronically Signed   By: Macy Mis M.D.   On: 01/10/2020 21:58   CT Head Wo Contrast  Result Date: 01/09/2020 CLINICAL DATA:  Follow-up. Patient fell at the grocery store. On blood thinners. CT obtained earlier today demonstrates a subdural hemorrhage along the right tentorium. EXAM: CT HEAD WITHOUT CONTRAST TECHNIQUE: Contiguous axial images were obtained from the base of the skull through the vertex without intravenous contrast. COMPARISON:  None. FINDINGS: Brain: Small subdural hemorrhage along the right tentorium is unchanged. No evidence of new intracranial hemorrhage. Ventricular and sulcal enlargement consistent with atrophy is stable. Patchy white matter hypoattenuation consistent with chronic microvascular ischemic changes stable. There are no parenchymal masses or mass effect, no midline shift, no evidence of ischemic infarct and no extra-axial masses. Vascular: No hyperdense vessel or unexpected calcification. Skull: No skull fracture or bone lesion. Sinuses/Orbits: Visualized  globes and orbits are unremarkable. Small amount of dependent secretions in the right sphenoid sinus dependent fluid in the left maxillary sinus. These findings are stable. Other: None. IMPRESSION: 1. No change from the earlier head CT. 2. Small acute subdural hematoma along the right tentorium. No new intracranial hemorrhage. Electronically Signed   By: Lajean Manes M.D.   On: 01/09/2020 19:34   CT Head Wo Contrast  Result Date: 01/09/2020 CLINICAL DATA:  Head trauma. Trip and fall. Left supraorbital hematoma. Patient is on blood thinners. EXAM: CT HEAD WITHOUT CONTRAST CT MAXILLOFACIAL WITHOUT CONTRAST TECHNIQUE: Multidetector CT imaging of  the head and maxillofacial structures were performed using the standard protocol without intravenous contrast. Multiplanar CT image reconstructions of the maxillofacial structures were also generated. COMPARISON:  October 18, 2018. FINDINGS: CT HEAD FINDINGS Brain: Thin (3-4 mm thick) acute subdural hemorrhage layering along the right tentorial leaflet (series 4, image 45), new from prior. No evidence of acute infarction, hydrocephalus, extra-axial collection or mass lesion/mass effect. Patchy white matter hypoattenuation, most likely related to chronic microvascular ischemic disease. Similar generalized cerebral volume loss with ex vacuo ventricular dilation. Vascular: Calcific atherosclerosis. Skull: No acute fracture. Other: No mastoid effusions. CT MAXILLOFACIAL FINDINGS Osseous: Acute mildly depressed fracture of the left anterior maxillary sinus wall with nondisplaced extension through the posterior maxillary sinus wall Orbits: Globes are symmetric and within normal limits. No retro bulbar hemorrhage. No evidence of acute orbital fracture. Sinuses: Left maxillary hemosinus. Frothy secretions in the right sphenoid sinus. Small right maxillary retention cyst. Soft tissues: Left periorbital and premaxillary soft tissue contusion. IMPRESSION: 1. Thin (3-4 mm thick) acute subdural hemorrhage layering along the right tentorial leaflet. No substantial mass effect. 2. Left periorbital and premaxillary soft tissue contusion with acute mildly depressed fracture of the left anterior maxillary sinus wall with nondisplaced extension through the posterior maxillary sinus wall. Findings were discussed with Dr. Charna Archer At 1:16 p.m. via telephone. Electronically Signed   By: Margaretha Sheffield MD   On: 01/09/2020 13:20   CT Cervical Spine Wo Contrast  Result Date: 01/09/2020 CLINICAL DATA:  Neck trauma. Status post fall. LEFT orbital hematoma and subdural hemorrhage. EXAM: CT CERVICAL SPINE WITHOUT CONTRAST TECHNIQUE:  Multidetector CT imaging of the cervical spine was performed without intravenous contrast. Multiplanar CT image reconstructions were also generated. COMPARISON:  CT of the head earlier today FINDINGS: Alignment: There is loss of cervical lordosis. Degenerative changes are identified at C5-6. There is 2 millimeters of anterolisthesis of C4 on C5. Otherwise alignment is normal. Skull base and vertebrae: No acute fracture. No primary bone lesion or focal pathologic process. Soft tissues and spinal canal: No prevertebral fluid or swelling. No visible canal hematoma. Disc levels: Disc height loss and uncovertebral spurring primarily at C5-6. Upper chest: Negative. Other: None. IMPRESSION: 1. No evidence for acute cervical spine abnormality. 2. Significant degenerative changes at C5-6. 3. Loss of cervical lordosis. Electronically Signed   By: Nolon Nations M.D.   On: 01/09/2020 14:04   DG Pelvis Portable  Result Date: 01/09/2020 CLINICAL DATA:  [place] at the grocery store earlier today onto her left side. Pain. EXAM: PORTABLE PELVIS 1-2 VIEWS COMPARISON:  None. FINDINGS: No fracture or bone lesion. Hip joints, SI joints and symphysis pubis are normally spaced and aligned. No significant arthropathic change. Soft tissues are unremarkable. There are disc degenerative changes of the lower lumbar spine. IMPRESSION: 1. No fracture or acute finding.  No significant joint abnormality Electronically Signed   By: Lajean Manes  M.D.   On: 01/09/2020 15:53   DG Shoulder Left  Result Date: 01/09/2020 CLINICAL DATA:  Slipped and fell landing on the left side. Left shoulder pain. EXAM: LEFT SHOULDER - 2+ VIEW COMPARISON:  None. FINDINGS: Comminuted fracture of the proximal humerus. There is a transverse fracture across the surgical neck with additional fracture across the base of the greater tuberosity. Tuberosity fracture component is mildly displaced, 5-6 mm laterally. There is no significant fracture angulation. Glenohumeral  joint remains normally aligned. AC joint normally spaced and aligned. Skeletal structures are demineralized. IMPRESSION: 1. Mildly comminuted fracture of the proximal left humerus as described. No dislocation. Electronically Signed   By: Lajean Manes M.D.   On: 01/09/2020 14:16   CT MAXILLOFACIAL WO CONTRAST  Result Date: 01/09/2020 CLINICAL DATA:  Head trauma. Trip and fall. Left supraorbital hematoma. Patient is on blood thinners. EXAM: CT HEAD WITHOUT CONTRAST CT MAXILLOFACIAL WITHOUT CONTRAST TECHNIQUE: Multidetector CT imaging of the head and maxillofacial structures were performed using the standard protocol without intravenous contrast. Multiplanar CT image reconstructions of the maxillofacial structures were also generated. COMPARISON:  October 18, 2018. FINDINGS: CT HEAD FINDINGS Brain: Thin (3-4 mm thick) acute subdural hemorrhage layering along the right tentorial leaflet (series 4, image 45), new from prior. No evidence of acute infarction, hydrocephalus, extra-axial collection or mass lesion/mass effect. Patchy white matter hypoattenuation, most likely related to chronic microvascular ischemic disease. Similar generalized cerebral volume loss with ex vacuo ventricular dilation. Vascular: Calcific atherosclerosis. Skull: No acute fracture. Other: No mastoid effusions. CT MAXILLOFACIAL FINDINGS Osseous: Acute mildly depressed fracture of the left anterior maxillary sinus wall with nondisplaced extension through the posterior maxillary sinus wall Orbits: Globes are symmetric and within normal limits. No retro bulbar hemorrhage. No evidence of acute orbital fracture. Sinuses: Left maxillary hemosinus. Frothy secretions in the right sphenoid sinus. Small right maxillary retention cyst. Soft tissues: Left periorbital and premaxillary soft tissue contusion. IMPRESSION: 1. Thin (3-4 mm thick) acute subdural hemorrhage layering along the right tentorial leaflet. No substantial mass effect. 2. Left  periorbital and premaxillary soft tissue contusion with acute mildly depressed fracture of the left anterior maxillary sinus wall with nondisplaced extension through the posterior maxillary sinus wall. Findings were discussed with Dr. Charna Archer At 1:16 p.m. via telephone. Electronically Signed   By: Margaretha Sheffield MD   On: 01/09/2020 13:20    Review of Systems Blood pressure 136/73, pulse 81, temperature (!) 97.5 F (36.4 C), temperature source Oral, resp. rate 16, height 5' (1.524 m), weight 45.4 kg, SpO2 99 %. Physical Exam She is neurovascular intact in the left hand with splint in place. She has tenderness to the proximal humerus. Assessment/Plan: 1.  Impacted left surgical neck fracture of the humerus.  Recommend nonoperative treatment. 2.  Comminuted intra-articular distal radius fracture.  Recommendation is for open reduction internal fixation so that she can just wear a light removable brace and start some partial weightbearing on that left upper extremity.  At present her INR is 3.0 and that needs to come down some to have surgery without wound complication.  We will continue to follow her daily and schedule surgery when INR is improved.  Hessie Knows 01/11/2020, 12:48 PM

## 2020-01-11 NOTE — ED Notes (Signed)
Upon entering room for pt vitals, pt was soiled and purewick was not suctioning properly. Pt was cleaned and bedding changed. Pt resting in bed with son at bedside.

## 2020-01-11 NOTE — ED Notes (Signed)
Assigned RN on handoff is night shift. Floor called to update

## 2020-01-11 NOTE — Progress Notes (Addendum)
Physical Therapy Treatment Patient Details Name: Laura David MRN: [id number] DOB: 10/09/1934 Today's Date: 01/11/2020    History of Present Illness Pt admitted for acute subdural hemorrhage and L UE fractures following fall at grocery store. Per neuro-hemorrhage stable and cleared to work with therapy. History includes breast cancer, GERD, and HTN.    PT Comments    Pt is making limited progress towards goals. Lethargic throughout session. Does have decreased pain in lower body, however moans out in pain with any touch on L arm. Repositioning performed in bed with extremities elevated on pillows. Pt able to participate in supine there-ex. Transfers attempted to EOB, however unable to fully transition. Will continue to progress as able.  Follow Up Recommendations  SNF     Equipment Recommendations       Recommendations for Other Services       Precautions / Restrictions Precautions Precautions: Fall Restrictions Weight Bearing Restrictions: Yes LUE Weight Bearing: Non weight bearing    Mobility  Bed Mobility Overal bed mobility: Needs Assistance Bed Mobility: Supine to Sit     Supine to sit: Max assist     General bed mobility comments: Attempted to transition to EOB. Able to initiate sliding B LEs off bed, however needs max assist for trunk support and unable to fully get to EOB without severe pain. Assisted to return back supine.  Transfers                 General transfer comment: unable to further attempt transfers  Ambulation/Gait                 Stairs             Wheelchair Mobility    Modified Rankin (Stroke Patients Only)       Balance Overall balance assessment: Needs assistance;History of Falls Sitting-balance support: Feet supported Sitting balance-Leahy Scale: Poor                                      Cognition Arousal/Alertness: Lethargic Behavior During Therapy: WFL for tasks  assessed/performed Overall Cognitive Status: Within Functional Limits for tasks assessed                                 General Comments: lethargic throughout session, needs constant stimulation to maintain alertness      Exercises Other Exercises Other Exercises: supine ther-ex performed on B LE including AP, SLRs, hip abd/add, and heel slides. All ther-ex performed x 10 reps and reports minimal pain.    General Comments        Pertinent Vitals/Pain Pain Assessment: 0-10 Pain Score: 7  Pain Location: L hemibody Pain Descriptors / Indicators: Aching;Constant;Discomfort;Guarding;Grimacing Pain Intervention(s): Limited activity within patient's tolerance    Home Living                      Prior Function            PT Goals (current goals can now be found in the care plan section) Acute Rehab PT Goals Patient Stated Goal: to have less pain PT Goal Formulation: With patient Time For Goal Achievement: 01/24/20 Potential to Achieve Goals: Fair Progress towards PT goals: Progressing toward goals    Frequency    7X/week      PT Plan Current plan remains appropriate  Co-evaluation              AM-PAC PT "6 Clicks" Mobility   Outcome Measure  Help needed turning from your back to your side while in a flat bed without using bedrails?: A Lot Help needed moving from lying on your back to sitting on the side of a flat bed without using bedrails?: Total Help needed moving to and from a bed to a chair (including a wheelchair)?: Total Help needed standing up from a chair using your arms (e.g., wheelchair or bedside chair)?: Total Help needed to walk in hospital room?: Total Help needed climbing 3-5 steps with a railing? : Total 6 Click Score: 7    End of Session   Activity Tolerance: Patient limited by pain Patient left: in bed;with bed alarm set Nurse Communication: Mobility status PT Visit Diagnosis: Muscle weakness (generalized)  (M62.81);History of falling (Z91.81);Difficulty in walking, not elsewhere classified (R26.2);Pain Pain - Right/Left: Left Pain - part of body: Arm     Time: [id number] PT Time Calculation (min) (ACUTE ONLY): 23 min  Charges:  $Therapeutic Exercise: 23-37 mins                     Greggory Stallion, PT, DPT [phone]    Terrisha Lopata 01/11/2020, 2:44 PM

## 2020-01-11 NOTE — Progress Notes (Addendum)
Progress Note    Laura David  WVP:[id number] DOB: 1934-10-10  DOA: 01/09/2020 PCP: Dorothey Baseman, MD      Brief Narrative:    Medical records reviewed and are as summarized below:  Laura David is a 86 y.o. female with medical history including but not limited to breast cancer, GERD, hyperlipidemia, hypertension, paroxysmal atrial fibrillation on chronic warfarin therapy, basal cell carcinoma, who came to the hospital after sustaining a mechanical fall at home.      Assessment/Plan:   Principal Problem:   Fall Active Problems:   Hypertension   Hyperlipidemia   PAF (paroxysmal atrial fibrillation) (HCC)   Asthma   GERD (gastroesophageal reflux disease)   Breast cancer (HCC)   Coagulopathy (HCC)   Subdural hematoma (HCC)   Leucocytosis   Hypokalemia   Closed comminuted left humeral fracture    Body mass index is 19.53 kg/m.     S/p mechanical fall leading to mildly comminuted fracture of left proximal left humerus, comminuted, intra-articular fracture of left distal radius, acute mildly depressed fracture of left anterior maxillary sinus wall: Continue analgesics as needed for pain.  She has been evaluated by orthopedic surgeon with plan for left wrist surgery with INR is at a safe level for surgery.   Subdural hemorrhage: Repeat CT head x2 bleeding showed that subdural bleed is stable.  Coumadin has been held.  Neurosurgeon has evaluated the patient and recommended holding Coumadin for a week.  DVT prophylaxis is okay as needed per neurosurgeon.  Paroxysmal atrial fibrillation: Coumadin has been held because of subdural hemorrhage.   Hypokalemia: Improving.  Continue potassium repletion and monitor levels.  Hypertension: Continue antihypertensives   Leukocytosis: Improved.   Diet Order            Diet Heart Room service appropriate? Yes; Fluid consistency: Thin  Diet effective now                    Consultants:  Orthopedic  surgeon  Procedures:  None    Medications:   . amLODipine  5 mg Oral Daily  . atorvastatin  10 mg Oral Daily  . lidocaine  1 patch Transdermal Q24H  . metoprolol tartrate  25 mg Oral BID   Continuous Infusions:   Anti-infectives (From admission, onward)   None             Family Communication/Anticipated D/C date and plan/Code Status   DVT prophylaxis: SCDs Start: 01/09/20 2120     Code Status: DNR  Family Communication: Plan discussed with her son Laura David) at the bedside Disposition Plan:    Status is: Inpatient  Remains inpatient appropriate because:Unsafe d/c plan and Inpatient level of care appropriate due to severity of illness   Dispo: The patient is from: Home              Anticipated d/c is to: SNF              Anticipated d/c date is: > 3 days              Patient currently is not medically stable to d/c.           Subjective:   C/o pain in the left arm and the left hand.  Objective:    Vitals:   01/11/20 0600 01/11/20 0630 01/11/20 0903 01/11/20 1156  BP: 138/72 118/71 (!) 154/85 136/73  Pulse: 71 73 84 81  Resp: 15 15 16 16   Temp:   )  97.4 F (36.3 C) (!) 97.5 F (36.4 C)  TempSrc:   Oral Oral  SpO2: 97% 97% 100% 99%  Weight:      Height:       No data found.   Intake/Output Summary (Last 24 hours) at 01/11/2020 1617 Last data filed at 01/11/2020 1403 Gross per 24 hour  Intake 2299.31 ml  Output 850 ml  Net 1449.31 ml   Filed Weights   01/09/20 1233  Weight: 45.4 kg    Exam:  GEN: NAD SKIN: Bruises with swelling and tenderness on the left lateral thigh. EYES: EOMI. PERRLA. Left periorbital bruising. ENT: MMM CV: RRR PULM: CTA B ABD: soft, ND, NT, +BS CNS: AAO x 3, non focal EXT: Bruising on the left arm.  Swelling and tenderness of left upper extremity.  Dressing on left upper extremity.   Data Reviewed:   I have personally reviewed following labs and imaging studies:  Labs: Labs show the  following:   Basic Metabolic Panel: Recent Labs  Lab 01/09/20 1445 01/10/20 0439  NA 140 137  K 3.1* 3.4*  CL 101 101  CO2 23 27  GLUCOSE 139* 92  BUN 17 12  CREATININE 0.83 0.66  CALCIUM 9.8 8.6*   GFR Estimated Creatinine Clearance: 38.2 mL/min (by C-G formula based on SCr of 0.66 mg/dL). Liver Function Tests: Recent Labs  Lab 01/09/20 1445 01/10/20 0439  AST 28 21  ALT 14 12  ALKPHOS 64 51  BILITOT 1.1 1.5*  PROT 7.9 6.6  ALBUMIN 4.4 3.7   No results for input(s): LIPASE, AMYLASE in the last 168 hours. No results for input(s): AMMONIA in the last 168 hours. Coagulation profile Recent Labs  Lab 01/09/20 1445 01/11/20 0325  INR 2.4* 3.0*    CBC: Recent Labs  Lab 01/09/20 1445 01/10/20 0439  WBC 17.6* 9.8  NEUTROABS 15.5*  --   HGB 13.8 11.2*  HCT 41.5 33.8*  MCV 90.6 91.6  PLT 251 198   Cardiac Enzymes: No results for input(s): CKTOTAL, CKMB, CKMBINDEX, TROPONINI in the last 168 hours. BNP (last 3 results) No results for input(s): PROBNP in the last 8760 hours. CBG: No results for input(s): GLUCAP in the last 168 hours. D-Dimer: No results for input(s): DDIMER in the last 72 hours. Hgb A1c: No results for input(s): HGBA1C in the last 72 hours. Lipid Profile: No results for input(s): CHOL, HDL, LDLCALC, TRIG, CHOLHDL, LDLDIRECT in the last 72 hours. Thyroid function studies: No results for input(s): TSH, T4TOTAL, T3FREE, THYROIDAB in the last 72 hours.  Invalid input(s): FREET3 Anemia work up: No results for input(s): VITAMINB12, FOLATE, FERRITIN, TIBC, IRON, RETICCTPCT in the last 72 hours. Sepsis Labs: Recent Labs  Lab 01/09/20 1445 01/10/20 0439  WBC 17.6* 9.8    Microbiology Recent Results (from the past 240 hour(s))  Resp Panel by RT-PCR (Flu A&B, Covid) Nasopharyngeal Swab     Status: None   Collection Time: 01/09/20  9:14 PM   Specimen: Nasopharyngeal Swab; Nasopharyngeal(NP) swabs in vial transport medium  Result Value Ref  Range Status   SARS Coronavirus 2 by RT PCR NEGATIVE NEGATIVE Final    Comment: (NOTE) SARS-CoV-2 target nucleic acids are NOT DETECTED.  The SARS-CoV-2 RNA is generally detectable in upper respiratory specimens during the acute phase of infection. The lowest concentration of SARS-CoV-2 viral copies this assay can detect is 138 copies/mL. A negative result does not preclude SARS-Cov-2 infection and should not be used as the sole basis for treatment or other  patient management decisions. A negative result may occur with  improper specimen collection/handling, submission of specimen other than nasopharyngeal swab, presence of viral mutation(s) within the areas targeted by this assay, and inadequate number of viral copies(<138 copies/mL). A negative result must be combined with clinical observations, patient history, and epidemiological information. The expected result is Negative.  Fact Sheet for Patients:  EntrepreneurPulse.com.au  Fact Sheet for Healthcare Providers:  IncredibleEmployment.be  This test is no t yet approved or cleared by the [place] FDA and  has been authorized for detection and/or diagnosis of SARS-CoV-2 by FDA under an Emergency Use Authorization (EUA). This EUA will remain  in effect (meaning this test can be used) for the duration of the COVID-19 declaration under Section 564(b)(1) of the Act, 21 U.S.C.section 360bbb-3(b)(1), unless the authorization is terminated  or revoked sooner.       Influenza A by PCR NEGATIVE NEGATIVE Final   Influenza B by PCR NEGATIVE NEGATIVE Final    Comment: (NOTE) The Xpert Xpress SARS-CoV-2/FLU/RSV plus assay is intended as an aid in the diagnosis of influenza from Nasopharyngeal swab specimens and should not be used as a sole basis for treatment. Nasal washings and aspirates are unacceptable for Xpert Xpress SARS-CoV-2/FLU/RSV testing.  Fact Sheet for  Patients: EntrepreneurPulse.com.au  Fact Sheet for Healthcare Providers: IncredibleEmployment.be  This test is not yet approved or cleared by the [place] FDA and has been authorized for detection and/or diagnosis of SARS-CoV-2 by FDA under an Emergency Use Authorization (EUA). This EUA will remain in effect (meaning this test can be used) for the duration of the COVID-19 declaration under Section 564(b)(1) of the Act, 21 U.S.C. section 360bbb-3(b)(1), unless the authorization is terminated or revoked.  Performed at [facility], [address] [place], [place] [address]     Procedures and diagnostic studies:  CT HEAD WO CONTRAST  Result Date: 01/10/2020 CLINICAL DATA:  Subdural hemorrhage, follow-up EXAM: CT HEAD WITHOUT CONTRAST TECHNIQUE: Contiguous axial images were obtained from the base of the skull through the vertex without intravenous contrast. COMPARISON:  01/09/2020 FINDINGS: Brain: Small volume subdural hemorrhage is again identified along the posterior falx and right tentorial leaflet. No significant change allowing for some interval redistribution. No new hemorrhage. Ventricles are stable in size. There are stable findings of chronic microvascular ischemic changes. Gray-white differentiation is preserved. Vascular: No new findings. Skull: No new findings. Sinuses/Orbits: No acute finding. Other: None. IMPRESSION: No substantial change in small volume subdural hemorrhage. No new hemorrhage. Electronically Signed   By: Macy Mis M.D.   On: 01/10/2020 21:58   CT Head Wo Contrast  Result Date: 01/09/2020 CLINICAL DATA:  Follow-up. Patient fell at the grocery store. On blood thinners. CT obtained earlier today demonstrates a subdural hemorrhage along the right tentorium. EXAM: CT HEAD WITHOUT CONTRAST TECHNIQUE: Contiguous axial images were obtained from the base of the skull through the vertex without intravenous contrast.  COMPARISON:  None. FINDINGS: Brain: Small subdural hemorrhage along the right tentorium is unchanged. No evidence of new intracranial hemorrhage. Ventricular and sulcal enlargement consistent with atrophy is stable. Patchy white matter hypoattenuation consistent with chronic microvascular ischemic changes stable. There are no parenchymal masses or mass effect, no midline shift, no evidence of ischemic infarct and no extra-axial masses. Vascular: No hyperdense vessel or unexpected calcification. Skull: No skull fracture or bone lesion. Sinuses/Orbits: Visualized globes and orbits are unremarkable. Small amount of dependent secretions in the right sphenoid sinus dependent fluid in the left maxillary sinus.  These findings are stable. Other: None. IMPRESSION: 1. No change from the earlier head CT. 2. Small acute subdural hematoma along the right tentorium. No new intracranial hemorrhage. Electronically Signed   By: Lajean Manes M.D.   On: 01/09/2020 19:34               LOS: 1 day   Tahsin Benyo  [facility]   Pager on [email or web address]. If 7PM-7AM, please contact night-coverage at [email or web address]     01/11/2020, 4:17 PM

## 2020-01-11 NOTE — ED Notes (Signed)
Pt alert and oriented x4. Pt endorsing pain in LUE 10/10.

## 2020-01-11 NOTE — ED Notes (Signed)
Secure message report to [employer]

## 2020-01-12 DIAGNOSIS — W19XXXD Unspecified fall, subsequent encounter: Secondary | ICD-10-CM | POA: Diagnosis not present

## 2020-01-12 DIAGNOSIS — S065X9A Traumatic subdural hemorrhage with loss of consciousness of unspecified duration, initial encounter: Secondary | ICD-10-CM | POA: Diagnosis not present

## 2020-01-12 DIAGNOSIS — D689 Coagulation defect, unspecified: Secondary | ICD-10-CM | POA: Diagnosis not present

## 2020-01-12 DIAGNOSIS — S42352D Displaced comminuted fracture of shaft of humerus, left arm, subsequent encounter for fracture with routine healing: Secondary | ICD-10-CM | POA: Diagnosis not present

## 2020-01-12 LAB — BASIC METABOLIC PANEL
Anion gap: 10 (ref 5–15)
BUN: 13 mg/dL (ref 8–23)
CO2: 28 mmol/L (ref 22–32)
Calcium: 9 mg/dL (ref 8.9–10.3)
Chloride: 95 mmol/L — ABNORMAL LOW (ref 98–111)
Creatinine, Ser: 0.73 mg/dL (ref 0.44–1.00)
GFR, Estimated: >60 mL/min (ref >60)
Glucose, Bld: 97 mg/dL (ref 70–99)
Potassium: 3.2 mmol/L — ABNORMAL LOW (ref 3.5–5.1)
Sodium: 133 mmol/L — ABNORMAL LOW (ref 135–145)

## 2020-01-12 LAB — PHOSPHORUS: Phosphorus: 1.6 mg/dL — ABNORMAL LOW (ref 2.5–4.6)

## 2020-01-12 LAB — PROTIME-INR
INR: 2.8 — ABNORMAL HIGH (ref 0.8–1.2)
Prothrombin Time: 28.6 seconds — ABNORMAL HIGH (ref 11.4–15.2)

## 2020-01-12 LAB — MAGNESIUM: Magnesium: 1.6 mg/dL — ABNORMAL LOW (ref 1.7–2.4)

## 2020-01-12 MED ORDER — ENSURE ENLIVE PO LIQD
237.0000 mL | Freq: Two times a day (BID) | ORAL | Status: DC
  Administered 2020-01-12 – 2020-01-16 (×5): 237 mL via ORAL

## 2020-01-12 MED ORDER — K PHOS MONO-SOD PHOS DI & MONO 155-852-130 MG PO TABS
500.0000 mg | ORAL_TABLET | Freq: Three times a day (TID) | ORAL | Status: DC
  Administered 2020-01-12 (×2): 500 mg via ORAL
  Filled 2020-01-12 (×6): qty 2

## 2020-01-12 MED ORDER — MAGNESIUM SULFATE 2 GM/50ML IV SOLN
2.0000 g | Freq: Once | INTRAVENOUS | Status: AC
  Administered 2020-01-12: 2 g via INTRAVENOUS
  Filled 2020-01-12: qty 50

## 2020-01-12 MED ORDER — POTASSIUM CHLORIDE CRYS ER 20 MEQ PO TBCR
40.0000 meq | EXTENDED_RELEASE_TABLET | ORAL | Status: AC
Start: 2020-01-12 — End: 2020-01-12
  Administered 2020-01-12 (×2): 40 meq via ORAL
  Filled 2020-01-12 (×2): qty 2

## 2020-01-12 MED ORDER — ADULT MULTIVITAMIN W/MINERALS CH
1.0000 | ORAL_TABLET | Freq: Every day | ORAL | Status: DC
  Administered 2020-01-14 – 2020-01-17 (×4): 1 via ORAL
  Filled 2020-01-12 (×5): qty 1

## 2020-01-12 NOTE — Progress Notes (Signed)
Physical Therapy Treatment Patient Details Name: Laura David MRN: [id number] DOB: 1934/08/19 Today's Date: 01/12/2020    History of Present Illness Pt admitted for acute subdural hemorrhage and L UE fractures following fall at grocery store. Per neuro-hemorrhage stable and cleared to work with therapy. History includes breast cancer, GERD, and HTN.    PT Comments    Pt is making limited progress towards goals secondary to pain. Per notes, possible surgery either Friday/Saturday. Good endurance with there-ex, however limited tolerance while seated at EOB. Will continue to progress as able.   Follow Up Recommendations  SNF     Equipment Recommendations       Recommendations for Other Services       Precautions / Restrictions Precautions Precautions: Fall Restrictions Weight Bearing Restrictions: Yes LUE Weight Bearing: Non weight bearing Other Position/Activity Restrictions: L radius fx and L humerus fx in splint    Mobility  Bed Mobility Overal bed mobility: Needs Assistance Bed Mobility: Supine to Sit     Supine to sit: Max assist     General bed mobility comments: needs assist for transition to EOB. Severe pain noted and once pt at EOB, able to maintain approx 30 seconds. Needs max assist to transition back supine.  Transfers                 General transfer comment: unable to further attempt transfers  Ambulation/Gait                 Stairs             Wheelchair Mobility    Modified Rankin (Stroke Patients Only)       Balance Overall balance assessment: Needs assistance;History of Falls Sitting-balance support: Feet unsupported Sitting balance-Leahy Scale: Poor                                      Cognition Arousal/Alertness: Awake/alert Behavior During Therapy: WFL for tasks assessed/performed Overall Cognitive Status: Within Functional Limits for tasks assessed                                         Exercises Other Exercises Other Exercises: supine ther-ex performed on B LE including AP, SLRs, SAQ, hip abd/add, and heel slides. All ther-ex performed x 12 reps and reports minimal pain. Other Exercises: Repositioned in bed with pillows    General Comments        Pertinent Vitals/Pain Pain Assessment: 0-10 Pain Score: 8  Pain Location: L hemibody Pain Descriptors / Indicators: Aching;Constant;Discomfort;Guarding;Grimacing Pain Intervention(s): Limited activity within patient's tolerance    Home Living                      Prior Function            PT Goals (current goals can now be found in the care plan section) Acute Rehab PT Goals Patient Stated Goal: to have less pain PT Goal Formulation: With patient Time For Goal Achievement: 01/24/20 Potential to Achieve Goals: Fair Progress towards PT goals: Progressing toward goals    Frequency    7X/week      PT Plan Current plan remains appropriate    Co-evaluation              AM-PAC PT "6 Clicks" Mobility  Outcome Measure  Help needed turning from your back to your side while in a flat bed without using bedrails?: A Lot Help needed moving from lying on your back to sitting on the side of a flat bed without using bedrails?: Total Help needed moving to and from a bed to a chair (including a wheelchair)?: Total Help needed standing up from a chair using your arms (e.g., wheelchair or bedside chair)?: Total Help needed to walk in hospital room?: Total Help needed climbing 3-5 steps with a railing? : Total 6 Click Score: 7    End of Session   Activity Tolerance: Patient limited by pain Patient left: in bed;with bed alarm set Nurse Communication: Mobility status PT Visit Diagnosis: Muscle weakness (generalized) (M62.81);History of falling (Z91.81);Difficulty in walking, not elsewhere classified (R26.2);Pain Pain - Right/Left: Left Pain - part of body: Arm     Time: [id number] PT Time  Calculation (min) (ACUTE ONLY): 24 min  Charges:  $Therapeutic Exercise: 23-37 mins                     Greggory Stallion, PT, DPT [phone]    [name] 01/12/2020, 5:10 PM

## 2020-01-12 NOTE — Progress Notes (Addendum)
Progress Note    Laura David  [id number] DOB: 08-29-1934  DOA: 01/09/2020 PCP: [name], MD      Brief Narrative:    Medical records reviewed and are as summarized below:  Laura David is a 86 y.o. female with medical history including but not limited to breast cancer, GERD, hyperlipidemia, hypertension, paroxysmal atrial fibrillation on chronic warfarin therapy, basal cell carcinoma, who came to the hospital after sustaining a mechanical fall at home.      Assessment/Plan:   Principal Problem:   Fall Active Problems:   Hypertension   Hyperlipidemia   PAF (paroxysmal atrial fibrillation) (HCC)   Asthma   GERD (gastroesophageal reflux disease)   Breast cancer (HCC)   Coagulopathy (HCC)   Subdural hematoma (HCC)   Leucocytosis   Hypokalemia   Closed comminuted left humeral fracture    Body mass index is 19.53 kg/m.     S/p mechanical fall leading to mildly comminuted fracture of left proximal left humerus, comminuted, intra-articular fracture of left distal radius, acute mildly depressed fracture of left anterior maxillary sinus wall: Continue analgesics as needed for pain.  INR is 2.8 which is not safe for surgery per orthopedic surgeon.  Monitor daily INR.  Plan for surgery to repair left distal radius fracture when INR is at a safe level for surgery.  Subdural hemorrhage: Repeat CT head x2 bleeding showed that subdural bleed is stable.  Coumadin has been held.  Neurosurgeon has evaluated the patient and recommended holding Coumadin for a week.  DVT prophylaxis is okay as needed per neurosurgeon.  Paroxysmal atrial fibrillation: Coumadin has been held because of subdural hemorrhage.   Hypokalemia, hypomagnesemia and hypophosphatemia: Replete potassium, magnesium and phosphorus levels.  Hypertension: Continue antihypertensives   Leukocytosis: Improved.   Diet Order            Diet regular Room service appropriate? Yes; Fluid  consistency: Thin  Diet effective now                    Consultants:  Orthopedic surgeon  Procedures:  None    Medications:   . amLODipine  5 mg Oral Daily  . atorvastatin  10 mg Oral Daily  . feeding supplement  237 mL Oral BID BM  . lidocaine  1 patch Transdermal Q24H  . metoprolol tartrate  25 mg Oral BID  . [START ON 01/13/2020] multivitamin with minerals  1 tablet Oral Daily   Continuous Infusions:   Anti-infectives (From admission, onward)   None             Family Communication/Anticipated D/C date and plan/Code Status   DVT prophylaxis: SCDs Start: 01/09/20 2120     Code Status: DNR  Family Communication: Plan discussed with her son Lennette Bihari) at the bedside Disposition Plan:    Status is: Inpatient  Remains inpatient appropriate because:Unsafe d/c plan and Inpatient level of care appropriate due to severity of illness   Dispo: The patient is from: Home              Anticipated d/c is to: SNF              Anticipated d/c date is: > 3 days              Patient currently is not medically stable to d/c.           Subjective:   C/o swelling in the left upper extremity.  His son is at  the bedside who reported that the patient has been intermittently confused.  Objective:    Vitals:   01/12/20 0023 01/12/20 0337 01/12/20 0855 01/12/20 1206  BP: 129/63 121/73 (!) 149/80 115/64  Pulse: 85 83 100 86  Resp: 20 16 18    Temp: 98 F (36.7 C) 97.8 F (36.6 C)    TempSrc: Oral Oral    SpO2: 97% 98% 97% 100%  Weight:      Height:       No data found.   Intake/Output Summary (Last 24 hours) at 01/12/2020 1500 Last data filed at 01/12/2020 0900 Gross per 24 hour  Intake 360 ml  Output 450 ml  Net -90 ml   Filed Weights   01/09/20 1233  Weight: 45.4 kg    Exam:  GEN: No acute distress SKIN: Bruises with swelling and tenderness on the left lateral thigh.   EYES: Left periorbital bruising ENT: MMM CV: Regular rate and  rhythm PULM: No wheezing or rales heard ABD: Soft, nondistended, nontender CNS: AAO x 3, non focal EXT: Bruising on the left.  Some tenderness of left upper extremity.  Dressing on left upper extremity.    Data Reviewed:   I have personally reviewed following labs and imaging studies:  Labs: Labs show the following:   Basic Metabolic Panel: Recent Labs  Lab 01/09/20 1445 01/10/20 0439 01/12/20 0612  NA 140 137 133*  K 3.1* 3.4* 3.2*  CL 101 101 95*  CO2 23 27 28   GLUCOSE 139* 92 97  BUN 17 12 13   CREATININE 0.83 0.66 0.73  CALCIUM 9.8 8.6* 9.0  MG  --   --  1.6*  PHOS  --   --  1.6*   GFR Estimated Creatinine Clearance: 38.2 mL/min (by C-G formula based on SCr of 0.73 mg/dL). Liver Function Tests: Recent Labs  Lab 01/09/20 1445 01/10/20 0439  AST 28 21  ALT 14 12  ALKPHOS 64 51  BILITOT 1.1 1.5*  PROT 7.9 6.6  ALBUMIN 4.4 3.7   No results for input(s): LIPASE, AMYLASE in the last 168 hours. No results for input(s): AMMONIA in the last 168 hours. Coagulation profile Recent Labs  Lab 01/09/20 1445 01/11/20 0325 01/12/20 0612  INR 2.4* 3.0* 2.8*    CBC: Recent Labs  Lab 01/09/20 1445 01/10/20 0439  WBC 17.6* 9.8  NEUTROABS 15.5*  --   HGB 13.8 11.2*  HCT 41.5 33.8*  MCV 90.6 91.6  PLT 251 198   Cardiac Enzymes: No results for input(s): CKTOTAL, CKMB, CKMBINDEX, TROPONINI in the last 168 hours. BNP (last 3 results) No results for input(s): PROBNP in the last 8760 hours. CBG: No results for input(s): GLUCAP in the last 168 hours. D-Dimer: No results for input(s): DDIMER in the last 72 hours. Hgb A1c: No results for input(s): HGBA1C in the last 72 hours. Lipid Profile: No results for input(s): CHOL, HDL, LDLCALC, TRIG, CHOLHDL, LDLDIRECT in the last 72 hours. Thyroid function studies: No results for input(s): TSH, T4TOTAL, T3FREE, THYROIDAB in the last 72 hours.  Invalid input(s): FREET3 Anemia work up: No results for input(s):  VITAMINB12, FOLATE, FERRITIN, TIBC, IRON, RETICCTPCT in the last 72 hours. Sepsis Labs: Recent Labs  Lab 01/09/20 1445 01/10/20 0439  WBC 17.6* 9.8    Microbiology Recent Results (from the past 240 hour(s))  Resp Panel by RT-PCR (Flu A&B, Covid) Nasopharyngeal Swab     Status: None   Collection Time: 01/09/20  9:14 PM   Specimen: Nasopharyngeal Swab; Nasopharyngeal(NP)  swabs in vial transport medium  Result Value Ref Range Status   SARS Coronavirus 2 by RT PCR NEGATIVE NEGATIVE Final    Comment: (NOTE) SARS-CoV-2 target nucleic acids are NOT DETECTED.  The SARS-CoV-2 RNA is generally detectable in upper respiratory specimens during the acute phase of infection. The lowest concentration of SARS-CoV-2 viral copies this assay can detect is 138 copies/mL. A negative result does not preclude SARS-Cov-2 infection and should not be used as the sole basis for treatment or other patient management decisions. A negative result may occur with  improper specimen collection/handling, submission of specimen other than nasopharyngeal swab, presence of viral mutation(s) within the areas targeted by this assay, and inadequate number of viral copies(<138 copies/mL). A negative result must be combined with clinical observations, patient history, and epidemiological information. The expected result is Negative.  Fact Sheet for Patients:  EntrepreneurPulse.com.au  Fact Sheet for Healthcare Providers:  IncredibleEmployment.be  This test is no t yet approved or cleared by the [place] FDA and  has been authorized for detection and/or diagnosis of SARS-CoV-2 by FDA under an Emergency Use Authorization (EUA). This EUA will remain  in effect (meaning this test can be used) for the duration of the COVID-19 declaration under Section 564(b)(1) of the Act, 21 U.S.C.section 360bbb-3(b)(1), unless the authorization is terminated  or revoked sooner.        Influenza A by PCR NEGATIVE NEGATIVE Final   Influenza B by PCR NEGATIVE NEGATIVE Final    Comment: (NOTE) The Xpert Xpress SARS-CoV-2/FLU/RSV plus assay is intended as an aid in the diagnosis of influenza from Nasopharyngeal swab specimens and should not be used as a sole basis for treatment. Nasal washings and aspirates are unacceptable for Xpert Xpress SARS-CoV-2/FLU/RSV testing.  Fact Sheet for Patients: EntrepreneurPulse.com.au  Fact Sheet for Healthcare Providers: IncredibleEmployment.be  This test is not yet approved or cleared by the [place] FDA and has been authorized for detection and/or diagnosis of SARS-CoV-2 by FDA under an Emergency Use Authorization (EUA). This EUA will remain in effect (meaning this test can be used) for the duration of the COVID-19 declaration under Section 564(b)(1) of the Act, 21 U.S.C. section 360bbb-3(b)(1), unless the authorization is terminated or revoked.  Performed at [facility], [address] [place], [place] [address]     Procedures and diagnostic studies:  CT HEAD WO CONTRAST  Result Date: 01/10/2020 CLINICAL DATA:  Subdural hemorrhage, follow-up EXAM: CT HEAD WITHOUT CONTRAST TECHNIQUE: Contiguous axial images were obtained from the base of the skull through the vertex without intravenous contrast. COMPARISON:  01/09/2020 FINDINGS: Brain: Small volume subdural hemorrhage is again identified along the posterior falx and right tentorial leaflet. No significant change allowing for some interval redistribution. No new hemorrhage. Ventricles are stable in size. There are stable findings of chronic microvascular ischemic changes. Gray-white differentiation is preserved. Vascular: No new findings. Skull: No new findings. Sinuses/Orbits: No acute finding. Other: None. IMPRESSION: No substantial change in small volume subdural hemorrhage. No new hemorrhage. Electronically Signed   By: Macy Mis M.D.   On: 01/10/2020 21:58               LOS: 2 days   Sherriann Szuch  [facility]   Pager on [email or web address]. If 7PM-7AM, please contact night-coverage at [email or web address]     01/12/2020, 3:00 PM

## 2020-01-12 NOTE — Progress Notes (Signed)
Initial Nutrition Assessment  DOCUMENTATION CODES:   Not applicable  INTERVENTION:   Ensure Enlive po BID, each supplement provides 350 kcal and 20 grams of protein  MVI po daily  Liberalize diet   Pt at high refeed risk; recommend monitor potassium, magnesium and phosphorus labs daily until stable  NUTRITION DIAGNOSIS:   Increased nutrient needs related to post-op healing as evidenced by estimated needs.  GOAL:   Patient will meet greater than or equal to 90% of their needs  MONITOR:   PO intake,Supplement acceptance,Labs,Weight trends,Skin,I & O's  REASON FOR ASSESSMENT:   Malnutrition Screening Tool    ASSESSMENT:   86 y.o. female with medical history significant of breast cancer, GERD, hyperlipidemia, hypertension, paroxysmal atrial fibrillation on chronic warfarin therapy and basal cell carcinoma who is admitted with SDH, comminuted fracture of the left wrist and left proximal humerus fracture after fall.  RD working remotely.  Unable to reach pt by phone. Per chart review, pt is documented to be eating anywhere from sips/bites to 80% of meals in hospital; pt ate 80% of her breakfast this morning. RD will add supplements and MVI to help pt meet her estimated needs. RD will also liberalize pt's diet. Per chart, pt down 11lbs(10%) from her UBW; RD unsure how recently weight loss occurred. Pt with electrolytes abnormalities; pt is at high refeed risk. Plan is for ORIF once pt's INR in acceptable range. RD will obtain nutrition related exam and history at follow-up.   Pt is at high risk for malnutrition  Medications reviewed and include: MVI, KCl  Labs reviewed: Na 133(L), K 3.2(L), P 1.6(L), Mg 1.6(L)  NUTRITION - FOCUSED PHYSICAL EXAM: Unable to perform at this time   Diet Order:   Diet Order            Diet Heart Room service appropriate? Yes; Fluid consistency: Thin  Diet effective now                EDUCATION NEEDS:   No education needs have been  identified at this time  Skin:  Skin Assessment: Reviewed RN Assessment (ecchymosis)  Last BM:  1/3  Height:   Ht Readings from Last 1 Encounters:  01/09/20 5' (1.524 m)    Weight:   Wt Readings from Last 1 Encounters:  01/09/20 45.4 kg    Ideal Body Weight:  45.45 kg  BMI:  Body mass index is 19.53 kg/m.  Estimated Nutritional Needs:   Kcal:  1200-1400kcal/day  Protein:  60-70g/day  Fluid:  1.2-1.4L/day  Betsey Holiday MS, RD, LDN Please refer to [facility] for RD and/or RD on-call/weekend/after hours pager

## 2020-01-12 NOTE — Progress Notes (Signed)
Patient started complaining of a headache this afternoon.  Gave her some Tylenol and then Oxycodone.  The pain in her arm improved but the headache did not.  It just made her a little fuzzy headed.  I notified the MD of patient still having a headache.  He acknowledged and said just to monitor for right now.  Did not want to order anything more at this time.

## 2020-01-12 NOTE — Progress Notes (Signed)
INR still too elevated, continue to follow.  Possible ORIF tomorrow pm or Saturday.

## 2020-01-13 ENCOUNTER — Encounter
Admission: EM | Disposition: A | Discharge status: Home / Self Care | Payer: Self-pay | Source: Home / Self Care | Attending: Internal Medicine

## 2020-01-13 ENCOUNTER — Inpatient Hospital Stay: Payer: Medicare Other

## 2020-01-13 ENCOUNTER — Inpatient Hospital Stay: Payer: Medicare Other | Admitting: Anesthesiology

## 2020-01-13 ENCOUNTER — Encounter: Payer: Self-pay | Admitting: Internal Medicine

## 2020-01-13 DIAGNOSIS — W19XXXD Unspecified fall, subsequent encounter: Secondary | ICD-10-CM | POA: Diagnosis not present

## 2020-01-13 DIAGNOSIS — S42352D Displaced comminuted fracture of shaft of humerus, left arm, subsequent encounter for fracture with routine healing: Secondary | ICD-10-CM | POA: Diagnosis not present

## 2020-01-13 DIAGNOSIS — S065X9A Traumatic subdural hemorrhage with loss of consciousness of unspecified duration, initial encounter: Secondary | ICD-10-CM | POA: Diagnosis not present

## 2020-01-13 DIAGNOSIS — D689 Coagulation defect, unspecified: Secondary | ICD-10-CM | POA: Diagnosis not present

## 2020-01-13 HISTORY — PX: ORIF WRIST FRACTURE: SHX2133

## 2020-01-13 LAB — BASIC METABOLIC PANEL
Anion gap: 10 (ref 5–15)
BUN: 15 mg/dL (ref 8–23)
CO2: 29 mmol/L (ref 22–32)
Calcium: 8.8 mg/dL — ABNORMAL LOW (ref 8.9–10.3)
Chloride: 99 mmol/L (ref 98–111)
Creatinine, Ser: 0.66 mg/dL (ref 0.44–1.00)
GFR, Estimated: >60 mL/min (ref >60)
Glucose, Bld: 115 mg/dL — ABNORMAL HIGH (ref 70–99)
Potassium: 3.9 mmol/L (ref 3.5–5.1)
Sodium: 138 mmol/L (ref 135–145)

## 2020-01-13 LAB — PHOSPHORUS: Phosphorus: 3.2 mg/dL (ref 2.5–4.6)

## 2020-01-13 LAB — PROTIME-INR
INR: 1.8 — ABNORMAL HIGH (ref 0.8–1.2)
Prothrombin Time: 19.9 seconds — ABNORMAL HIGH (ref 11.4–15.2)

## 2020-01-13 LAB — MAGNESIUM: Magnesium: 1.9 mg/dL (ref 1.7–2.4)

## 2020-01-13 SURGERY — OPEN REDUCTION INTERNAL FIXATION (ORIF) WRIST FRACTURE
Anesthesia: General | Site: Wrist | Laterality: Left

## 2020-01-13 MED ORDER — LACTATED RINGERS IV SOLN: INTRAVENOUS | Status: DC | PRN

## 2020-01-13 MED ORDER — FENTANYL CITRATE (PF) 100 MCG/2ML IJ SOLN
INTRAMUSCULAR | Status: AC
  Filled 2020-01-13: qty 2

## 2020-01-13 MED ORDER — FENTANYL CITRATE (PF) 100 MCG/2ML IJ SOLN
INTRAMUSCULAR | Status: DC | PRN
  Administered 2020-01-13: 50 ug via INTRAVENOUS
  Administered 2020-01-13 (×2): 25 ug via INTRAVENOUS
  Administered 2020-01-13: 50 ug via INTRAVENOUS
  Administered 2020-01-13: 25 ug via INTRAVENOUS

## 2020-01-13 MED ORDER — CEFAZOLIN SODIUM-DEXTROSE 1-4 GM/50ML-% IV SOLN
1.0000 g | Freq: Three times a day (TID) | INTRAVENOUS | Status: AC
  Administered 2020-01-14 (×2): 1 g via INTRAVENOUS
  Filled 2020-01-13 (×2): qty 50

## 2020-01-13 MED ORDER — EPHEDRINE 5 MG/ML INJ
INTRAVENOUS | Status: AC
  Filled 2020-01-13: qty 10

## 2020-01-13 MED ORDER — OXYCODONE HCL 5 MG PO TABS: 5.0000 mg | ORAL_TABLET | Freq: Once | ORAL | Status: DC | PRN

## 2020-01-13 MED ORDER — FENTANYL CITRATE (PF) 100 MCG/2ML IJ SOLN
25.0000 ug | INTRAMUSCULAR | Status: DC | PRN
  Administered 2020-01-13: 25 ug via INTRAVENOUS

## 2020-01-13 MED ORDER — OXYCODONE HCL 5 MG/5ML PO SOLN: 5.0000 mg | Freq: Once | ORAL | Status: DC | PRN

## 2020-01-13 MED ORDER — PROPOFOL 10 MG/ML IV BOLUS
INTRAVENOUS | Status: DC | PRN
  Administered 2020-01-13: 50 mg via INTRAVENOUS

## 2020-01-13 MED ORDER — ACETAMINOPHEN 10 MG/ML IV SOLN
INTRAVENOUS | Status: AC
  Filled 2020-01-13: qty 100

## 2020-01-13 MED ORDER — ONDANSETRON HCL 4 MG/2ML IJ SOLN: 4.0000 mg | Freq: Once | INTRAMUSCULAR | Status: DC | PRN

## 2020-01-13 MED ORDER — LIDOCAINE HCL (PF) 2 % IJ SOLN
INTRAMUSCULAR | Status: AC
  Filled 2020-01-13: qty 5

## 2020-01-13 MED ORDER — PHENYLEPHRINE HCL (PRESSORS) 10 MG/ML IV SOLN
INTRAVENOUS | Status: AC
  Filled 2020-01-13: qty 1

## 2020-01-13 MED ORDER — LIDOCAINE HCL (CARDIAC) PF 100 MG/5ML IV SOSY
PREFILLED_SYRINGE | INTRAVENOUS | Status: DC | PRN
  Administered 2020-01-13: 50 mg via INTRAVENOUS

## 2020-01-13 MED ORDER — FENTANYL CITRATE (PF) 100 MCG/2ML IJ SOLN
INTRAMUSCULAR | Status: AC
  Administered 2020-01-13: 25 ug via INTRAVENOUS
  Filled 2020-01-13: qty 2

## 2020-01-13 MED ORDER — PROPOFOL 10 MG/ML IV BOLUS
INTRAVENOUS | Status: AC
  Filled 2020-01-13: qty 20

## 2020-01-13 MED ORDER — CEFAZOLIN SODIUM-DEXTROSE 1-4 GM/50ML-% IV SOLN
1.0000 g | Freq: Once | INTRAVENOUS | Status: AC
  Administered 2020-01-13: 1 g via INTRAVENOUS
  Filled 2020-01-13 (×2): qty 50

## 2020-01-13 MED ORDER — ACETAMINOPHEN 10 MG/ML IV SOLN
INTRAVENOUS | Status: DC | PRN
  Administered 2020-01-13: 500 mg via INTRAVENOUS

## 2020-01-13 MED ORDER — HYDROCODONE-ACETAMINOPHEN 5-325 MG PO TABS
1.0000 | ORAL_TABLET | Freq: Four times a day (QID) | ORAL | Status: DC | PRN
  Filled 2020-01-13: qty 1

## 2020-01-13 SURGICAL SUPPLY — 44 items
BIT DRILL 2 FAST STEP (BIT) IMPLANT
BIT DRILL 2.5X4 QC (BIT) ×2 IMPLANT
BNDG ELASTIC 4X5.8 VLCR NS LF (GAUZE/BANDAGES/DRESSINGS) ×2 IMPLANT
BNDG ELASTIC 4X5.8 VLCR STR LF (GAUZE/BANDAGES/DRESSINGS) ×2 IMPLANT
CANISTER SUCT 1200ML W/VALVE (MISCELLANEOUS) ×2 IMPLANT
CHLORAPREP W/TINT 26 (MISCELLANEOUS) ×2 IMPLANT
COVER WAND RF STERILE (DRAPES) ×2 IMPLANT
CUFF TOURN SGL QUICK 18X4 (TOURNIQUET CUFF) IMPLANT
DRAPE FLUOR MINI C-ARM 54X84 (DRAPES) ×2 IMPLANT
ELECT CAUTERY BLADE 6.4 (BLADE) ×2 IMPLANT
ELECT REM PT RETURN 9FT ADLT (ELECTROSURGICAL) ×2
ELECTRODE REM PT RTRN 9FT ADLT (ELECTROSURGICAL) ×1 IMPLANT
GAUZE SPONGE 4X4 12PLY STRL (GAUZE/BANDAGES/DRESSINGS) ×2 IMPLANT
GAUZE XEROFORM 1X8 LF (GAUZE/BANDAGES/DRESSINGS) ×2 IMPLANT
GLOVE SURG SYN 9.0  PF PI (GLOVE) ×1
GLOVE SURG SYN 9.0 PF PI (GLOVE) ×1 IMPLANT
GOWN SRG 2XL LVL 4 RGLN SLV (GOWNS) ×1 IMPLANT
GOWN STRL NON-REIN 2XL LVL4 (GOWNS) ×1
GOWN STRL REUS W/ TWL LRG LVL3 (GOWN DISPOSABLE) ×1 IMPLANT
GOWN STRL REUS W/TWL LRG LVL3 (GOWN DISPOSABLE) ×1
K-WIRE 1.6 (WIRE) ×1
K-WIRE FX5X1.6XNS BN SS (WIRE) ×1
KIT TURNOVER KIT A (KITS) ×2 IMPLANT
KWIRE FX5X1.6XNS BN SS (WIRE) ×1 IMPLANT
MANIFOLD NEPTUNE II (INSTRUMENTS) ×2 IMPLANT
NEEDLE FILTER BLUNT 18X 1/2SAF (NEEDLE) ×1
NEEDLE FILTER BLUNT 18X1 1/2 (NEEDLE) ×1 IMPLANT
NS IRRIG 500ML POUR BTL (IV SOLUTION) ×2 IMPLANT
PACK EXTREMITY ARMC (MISCELLANEOUS) ×2 IMPLANT
PAD CAST CTTN 4X4 STRL (SOFTGOODS) ×1 IMPLANT
PADDING CAST COTTON 4X4 STRL (SOFTGOODS) ×1
PEG SUBCHONDRAL SMOOTH 2.0X14 (Peg) ×2 IMPLANT
PEG SUBCHONDRAL SMOOTH 2.0X20 (Peg) ×4 IMPLANT
PEG THREADED 2.5MMX22MM LONG (Peg) ×4 IMPLANT
PEG THREADED 2.5MMX24MM LONG (Peg) ×2 IMPLANT
PLATE SHORT 21.6X48.9 NRRW LT (Plate) ×2 IMPLANT
SCALPEL PROTECTED #15 DISP (BLADE) ×4 IMPLANT
SCREW CORT 3.5X10 LNG (Screw) ×6 IMPLANT
SPLINT CAST 1 STEP 3X12 (MISCELLANEOUS) ×2 IMPLANT
SUT ETHILON 4-0 (SUTURE) ×1
SUT ETHILON 4-0 FS2 18XMFL BLK (SUTURE) ×1
SUT VICRYL 3-0 27IN (SUTURE) ×2 IMPLANT
SUTURE ETHLN 4-0 FS2 18XMF BLK (SUTURE) ×1 IMPLANT
SYR 3ML LL SCALE MARK (SYRINGE) ×2 IMPLANT

## 2020-01-13 NOTE — Transfer of Care (Signed)
Immediate Anesthesia Transfer of Care Note  Patient: Laura David  Procedure(s) Performed: OPEN REDUCTION INTERNAL FIXATION (ORIF) WRIST FRACTURE (Left Wrist)  Patient Location: PACU  Anesthesia Type:General  Level of Consciousness: drowsy and patient cooperative  Airway & Oxygen Therapy: Patient Spontanous Breathing and Patient connected to face mask oxygen  Post-op Assessment: Report given to RN and Post -op Vital signs reviewed and stable  Post vital signs: Reviewed and stable  Last Vitals:  Vitals Value Taken Time  BP 179/96 01/13/20 1536  Temp 37.2 C 01/13/20 1533  Pulse 79 01/13/20 1539  Resp 16 01/13/20 1539  SpO2 100 % 01/13/20 1539  Vitals shown include unvalidated device data.  Last Pain:  Vitals:   01/13/20 1355  TempSrc: Oral  PainSc: 7       Patients Stated Pain Goal: 3 (53/74/82 7078)  Complications: No complications documented.

## 2020-01-13 NOTE — Anesthesia Postprocedure Evaluation (Signed)
Anesthesia Post Note  Patient: Laura David  Procedure(s) Performed: OPEN REDUCTION INTERNAL FIXATION (ORIF) WRIST FRACTURE (Left Wrist)  Patient location during evaluation: PACU Anesthesia Type: General Level of consciousness: awake and alert Pain management: pain level controlled Vital Signs Assessment: post-procedure vital signs reviewed and stable Respiratory status: spontaneous breathing, nonlabored ventilation, respiratory function stable and patient connected to nasal cannula oxygen Cardiovascular status: blood pressure returned to baseline and stable Postop Assessment: no apparent nausea or vomiting Anesthetic complications: no   No complications documented.   Last Vitals:  Vitals:   01/13/20 1658 01/13/20 1744  BP: 92/67 139/80  Pulse: 82 88  Resp: 16 16  Temp: 36.8 C 36.9 C  SpO2: 98% 98%    Last Pain:  Vitals:   01/13/20 1744  TempSrc: Oral  PainSc:                  Precious Haws Kyriaki Moder

## 2020-01-13 NOTE — Progress Notes (Addendum)
Progress Note    Laura David  YBO:[id number] DOB: Jan 16, 1934  DOA: 01/09/2020 PCP: [name], MD      Brief Narrative:    Medical records reviewed and are as summarized below:  Laura David is a 86 y.o. female with medical history including but not limited to breast cancer, GERD, hyperlipidemia, hypertension, paroxysmal atrial fibrillation on chronic warfarin therapy, basal cell carcinoma, who came to the hospital after sustaining a mechanical fall at home.      Assessment/Plan:   Principal Problem:   Fall Active Problems:   Hypertension   Hyperlipidemia   PAF (paroxysmal atrial fibrillation) (HCC)   Asthma   GERD (gastroesophageal reflux disease)   Breast cancer (HCC)   Coagulopathy (HCC)   Subdural hematoma (HCC)   Leucocytosis   Hypokalemia   Closed comminuted left humeral fracture    Body mass index is 21.48 kg/m.     S/p mechanical fall leading to mildly comminuted fracture of left proximal left humerus, comminuted, intra-articular fracture of left distal radius, acute mildly depressed fracture of left anterior maxillary sinus wall: INR is 1.8 today.  Plan for left wrist surgery today.  Continue analgesics.  Follow-up with orthopedic surgeon.  .  Subdural hemorrhage: Repeat CT head x2 bleeding showed that subdural bleed is stable.  Coumadin has been held.  Neurosurgeon has evaluated the patient and recommended holding Coumadin for a week.  DVT prophylaxis is okay as needed per neurosurgeon.  Paroxysmal atrial fibrillation: Coumadin has been held because of subdural hemorrhage.   Hypokalemia, hypomagnesemia and hypophosphatemia: Improved  Hypertension: Continue antihypertensives   Leukocytosis: Improved.   Diet Order            Diet NPO time specified  Diet effective now                    Consultants:  Orthopedic surgeon  Procedures:  None    Medications:   . [MAR Hold] amLODipine  5 mg Oral Daily  . [MAR Hold]  atorvastatin  10 mg Oral Daily  . [MAR Hold] feeding supplement  237 mL Oral BID BM  . [MAR Hold] lidocaine  1 patch Transdermal Q24H  . [MAR Hold] metoprolol tartrate  25 mg Oral BID  . [MAR Hold] multivitamin with minerals  1 tablet Oral Daily  . [MAR Hold] phosphorus  500 mg Oral TID   Continuous Infusions: . [MAR Hold]  ceFAZolin (ANCEF) IV       Anti-infectives (From admission, onward)   Start     Dose/Rate Route Frequency Ordered Stop   01/13/20 1500  [MAR Hold]  ceFAZolin (ANCEF) IVPB 1 g/50 mL premix        (MAR Hold since Fri 01/13/2020 at 1353.Hold Reason: Transfer to a Procedural area.)   1 g 100 mL/hr over 30 Minutes Intravenous  Once 01/13/20 0753               Family Communication/Anticipated D/C date and plan/Code Status   DVT prophylaxis: SCDs Start: 01/09/20 2120     Code Status: DNR  Family Communication: None Disposition Plan:    Status is: Inpatient  Remains inpatient appropriate because:Unsafe d/c plan and Inpatient level of care appropriate due to severity of illness   Dispo: The patient is from: Home              Anticipated d/c is to: SNF              Anticipated d/c  date is: > 3 days              Patient currently is not medically stable to d/c.           Subjective:   Interval events noted.  C/o pain and swelling in the left wrist and arm.  He also complains of pain in the left thigh.  Sarah, physical therapist, was at the bedside.  Objective:    Vitals:   01/13/20 0942 01/13/20 1226 01/13/20 1355 01/13/20 1402  BP: 140/73 (!) 145/83 140/84   Pulse: 99 96 92   Resp: 16 20 20    Temp: 98.2 F (36.8 C) 98.4 F (36.9 C) 98.1 F (36.7 C)   TempSrc: Oral Oral Oral   SpO2: 97% 100% 99%   Weight:    49.9 kg  Height:    5' (1.524 m)   No data found.   Intake/Output Summary (Last 24 hours) at 01/13/2020 1423 Last data filed at 01/13/2020 1336 Gross per 24 hour  Intake 50 ml  Output 450 ml  Net -400 ml   Filed Weights    01/09/20 1233 01/13/20 1402  Weight: 45.4 kg 49.9 kg    Exam:   GEN: NAD SKIN: Bruises and tenderness on the left lateral thigh. EYES: PERRL. left periorbital bruising.  Left subconjunctival hemorrhage ENT: MMM CV: RRR PULM: CTA B ABD: soft, ND, NT, +BS CNS: AAO x 3, non focal EXT: Bruises on the left arm.  Swelling and tenderness of the left upper extremity.    Data Reviewed:   I have personally reviewed following labs and imaging studies:  Labs: Labs show the following:   Basic Metabolic Panel: Recent Labs  Lab 01/09/20 1445 01/10/20 0439 01/12/20 0612 01/13/20 0439  NA 140 137 133* 138  K 3.1* 3.4* 3.2* 3.9  CL 101 101 95* 99  CO2 23 27 28 29   GLUCOSE 139* 92 97 115*  BUN 17 12 13 15   CREATININE 0.83 0.66 0.73 0.66  CALCIUM 9.8 8.6* 9.0 8.8*  MG  --   --  1.6* 1.9  PHOS  --   --  1.6* 3.2   GFR Estimated Creatinine Clearance: 38.3 mL/min (by C-G formula based on SCr of 0.66 mg/dL). Liver Function Tests: Recent Labs  Lab 01/09/20 1445 01/10/20 0439  AST 28 21  ALT 14 12  ALKPHOS 64 51  BILITOT 1.1 1.5*  PROT 7.9 6.6  ALBUMIN 4.4 3.7   No results for input(s): LIPASE, AMYLASE in the last 168 hours. No results for input(s): AMMONIA in the last 168 hours. Coagulation profile Recent Labs  Lab 01/09/20 1445 01/11/20 0325 01/12/20 0612 01/13/20 0439  INR 2.4* 3.0* 2.8* 1.8*    CBC: Recent Labs  Lab 01/09/20 1445 01/10/20 0439  WBC 17.6* 9.8  NEUTROABS 15.5*  --   HGB 13.8 11.2*  HCT 41.5 33.8*  MCV 90.6 91.6  PLT 251 198   Cardiac Enzymes: No results for input(s): CKTOTAL, CKMB, CKMBINDEX, TROPONINI in the last 168 hours. BNP (last 3 results) No results for input(s): PROBNP in the last 8760 hours. CBG: No results for input(s): GLUCAP in the last 168 hours. D-Dimer: No results for input(s): DDIMER in the last 72 hours. Hgb A1c: No results for input(s): HGBA1C in the last 72 hours. Lipid Profile: No results for input(s): CHOL,  HDL, LDLCALC, TRIG, CHOLHDL, LDLDIRECT in the last 72 hours. Thyroid function studies: No results for input(s): TSH, T4TOTAL, T3FREE, THYROIDAB in the last 72 hours.  Invalid input(s): FREET3 Anemia work up: No results for input(s): VITAMINB12, FOLATE, FERRITIN, TIBC, IRON, RETICCTPCT in the last 72 hours. Sepsis Labs: Recent Labs  Lab 01/09/20 1445 01/10/20 0439  WBC 17.6* 9.8    Microbiology Recent Results (from the past 240 hour(s))  Resp Panel by RT-PCR (Flu A&B, Covid) Nasopharyngeal Swab     Status: None   Collection Time: 01/09/20  9:14 PM   Specimen: Nasopharyngeal Swab; Nasopharyngeal(NP) swabs in vial transport medium  Result Value Ref Range Status   SARS Coronavirus 2 by RT PCR NEGATIVE NEGATIVE Final    Comment: (NOTE) SARS-CoV-2 target nucleic acids are NOT DETECTED.  The SARS-CoV-2 RNA is generally detectable in upper respiratory specimens during the acute phase of infection. The lowest concentration of SARS-CoV-2 viral copies this assay can detect is 138 copies/mL. A negative result does not preclude SARS-Cov-2 infection and should not be used as the sole basis for treatment or other patient management decisions. A negative result may occur with  improper specimen collection/handling, submission of specimen other than nasopharyngeal swab, presence of viral mutation(s) within the areas targeted by this assay, and inadequate number of viral copies(<138 copies/mL). A negative result must be combined with clinical observations, patient history, and epidemiological information. The expected result is Negative.  Fact Sheet for Patients:  EntrepreneurPulse.com.au  Fact Sheet for Healthcare Providers:  IncredibleEmployment.be  This test is no t yet approved or cleared by the [place] FDA and  has been authorized for detection and/or diagnosis of SARS-CoV-2 by FDA under an Emergency Use Authorization (EUA). This EUA will  remain  in effect (meaning this test can be used) for the duration of the COVID-19 declaration under Section 564(b)(1) of the Act, 21 U.S.C.section 360bbb-3(b)(1), unless the authorization is terminated  or revoked sooner.       Influenza A by PCR NEGATIVE NEGATIVE Final   Influenza B by PCR NEGATIVE NEGATIVE Final    Comment: (NOTE) The Xpert Xpress SARS-CoV-2/FLU/RSV plus assay is intended as an aid in the diagnosis of influenza from Nasopharyngeal swab specimens and should not be used as a sole basis for treatment. Nasal washings and aspirates are unacceptable for Xpert Xpress SARS-CoV-2/FLU/RSV testing.  Fact Sheet for Patients: EntrepreneurPulse.com.au  Fact Sheet for Healthcare Providers: IncredibleEmployment.be  This test is not yet approved or cleared by the [place] FDA and has been authorized for detection and/or diagnosis of SARS-CoV-2 by FDA under an Emergency Use Authorization (EUA). This EUA will remain in effect (meaning this test can be used) for the duration of the COVID-19 declaration under Section 564(b)(1) of the Act, 21 U.S.C. section 360bbb-3(b)(1), unless the authorization is terminated or revoked.  Performed at [facility], [address] [place], [place] [address]     Procedures and diagnostic studies:  No results found.             LOS: 3 days   Jisella Ashenfelter  [facility]   Pager on [email or web address]. If 7PM-7AM, please contact night-coverage at [email or web address]     01/13/2020, 2:23 PM

## 2020-01-13 NOTE — Progress Notes (Signed)
INR less than 2.0, hope to do surgery on wrist later today

## 2020-01-13 NOTE — Progress Notes (Signed)
Physical Therapy Treatment Patient Details Name: Laura David MRN: [id number] DOB: May 23, 1934 Today's Date: 01/13/2020    History of Present Illness Pt admitted for acute subdural hemorrhage and L UE fractures following fall at grocery store. Per neuro-hemorrhage stable and cleared to work with therapy. History includes breast cancer, GERD, and HTN.    PT Comments    Pt in bed,  Confusion and possible hallucinations but unable to clearly tell given cognition and mumbling speech at times.  She had taken off her gown and had sheet over her upper body.  Assisted with dressing and care due to inc urine.  She has difficulty rolling due to L UE pain but does quite well bridging for care with full hip clearance.  She participated in BLE supine ex with mod cues to stay on task.  OOB deferred due to pain and possible surgical intervention later today.    Follow Up Recommendations  SNF     Equipment Recommendations       Recommendations for Other Services       Precautions / Restrictions Precautions Precautions: Fall Required Braces or Orthoses: Sling;Splint/Cast Restrictions Weight Bearing Restrictions: Yes LUE Weight Bearing: Weight bearing as tolerated Other Position/Activity Restrictions: L radius fx and L humerus fx in splint    Mobility  Bed Mobility Overal bed mobility: Needs Assistance Bed Mobility: Rolling Rolling: Min assist;Mod assist         General bed mobility comments: deferred due to LUE pain and planned surgcal interventions later toay  Transfers                    Ambulation/Gait                 Stairs             Wheelchair Mobility    Modified Rankin (Stroke Patients Only)       Balance                                            Cognition   Behavior During Therapy: WFL for tasks assessed/performed Overall Cognitive Status: No family/caregiver present to determine baseline cognitive functioning                                  General Comments: confused and possibly with halucinations but unable to truely tell      Exercises Other Exercises Other Exercises: supine LE ex BLE x 10    General Comments        Pertinent Vitals/Pain Pain Assessment: Faces Faces Pain Scale: Hurts whole lot Pain Location: L arm primarily and some L LE with ex Pain Descriptors / Indicators: Aching;Constant;Discomfort;Guarding;Grimacing Pain Intervention(s): Limited activity within patient's tolerance;Monitored during session;Repositioned    Home Living                      Prior Function            PT Goals (current goals can now be found in the care plan section) Progress towards PT goals: Progressing toward goals    Frequency    7X/week      PT Plan Current plan remains appropriate    Co-evaluation              AM-PAC PT "6 Clicks"  Mobility   Outcome Measure  Help needed turning from your back to your side while in a flat bed without using bedrails?: A Lot Help needed moving from lying on your back to sitting on the side of a flat bed without using bedrails?: Total Help needed moving to and from a bed to a chair (including a wheelchair)?: Total Help needed standing up from a chair using your arms (e.g., wheelchair or bedside chair)?: Total Help needed to walk in hospital room?: Total Help needed climbing 3-5 steps with a railing? : Total 6 Click Score: 7    End of Session   Activity Tolerance: Patient limited by pain Patient left: in bed;with bed alarm set Nurse Communication: Mobility status Pain - Right/Left: Left Pain - part of body: Arm     Time: 5284-1324 PT Time Calculation (min) (ACUTE ONLY): 15 min  Charges:  $Therapeutic Exercise: 8-22 mins                   Chesley Noon, PTA 01/13/20, 11:02 AM

## 2020-01-13 NOTE — NC FL2 (Signed)
[place] LEVEL OF CARE SCREENING TOOL     IDENTIFICATION  Patient Name: Laura David Birthdate: [birth year] Sex: female Admission Date (Current Location): 01/09/2020  [facility] and [place] Number:  Engineering geologist and Address:         Provider Number: [id number]  Attending Physician Name and Address:  Jennye Boroughs, MD  Relative Name and Phone Number:       Current Level of Care: Hospital Recommended Level of Care: [place] Prior Approval Number:    Date Approved/Denied:   PASRR Number: [id number]  Discharge Plan: SNF    Current Diagnoses: Patient Active Problem List   Diagnosis Date Noted  . Closed fracture of left wrist   . Coagulopathy ([place]) 01/09/2020  . Subdural hematoma ([place]) 01/09/2020  . Leucocytosis 01/09/2020  . Fall 01/09/2020  . Hypokalemia 01/09/2020  . Closed comminuted left humeral fracture 01/09/2020  . Mitral regurgitation 05/10/2013  . Tricuspid regurgitation 05/10/2013  . Hypertension   . Hyperlipidemia   . PAF (paroxysmal atrial fibrillation) ([place])   . Osteopenia   . Cancer ([place])   . Asthma   . GERD (gastroesophageal reflux disease)   . Rickettsial disease   . Breast cancer ([place])     Orientation RESPIRATION BLADDER Height & Weight     Self,Time,Situation,Place (intermittent confusion)  Normal Continent Weight: 45.4 kg Height:  5' (152.4 cm)  BEHAVIORAL SYMPTOMS/MOOD NEUROLOGICAL BOWEL NUTRITION STATUS      Continent Diet (NPO for procedure.  will advance)  AMBULATORY STATUS COMMUNICATION OF NEEDS Skin   Extensive Assist Verbally Bruising (Plan for surgery on RUE today.  Will have incision)                       Personal Care Assistance Level of Assistance              Functional Limitations Info             SPECIAL CARE FACTORS FREQUENCY  PT (By licensed PT),OT (By licensed OT)                    Contractures Contractures Info: Not present    Additional Factors Info   Code Status,Allergies Code Status Info: DNR Allergies Info: Flagyl , Pacerone, Ramipril           Current Medications (01/13/2020):  This is the current hospital active medication list Current Facility-Administered Medications  Medication Dose Route Frequency Provider Last Rate Last Admin  . acetaminophen (TYLENOL) tablet 650 mg  650 mg Oral Q6H PRN Elwyn Reach, MD   650 mg at 01/12/20 1333   Or  . acetaminophen (TYLENOL) suppository 650 mg  650 mg Rectal Q6H PRN Gala Romney L, MD      . amLODipine (NORVASC) tablet 5 mg  5 mg Oral Daily Fritzi Mandes, MD   5 mg at 01/12/20 1037  . atorvastatin (LIPITOR) tablet 10 mg  10 mg Oral Daily Fritzi Mandes, MD   10 mg at 01/12/20 1037  . ceFAZolin (ANCEF) IVPB 1 g/50 mL premix  1 g Intravenous Once Hessie Knows, MD      . feeding supplement (ENSURE ENLIVE / ENSURE PLUS) liquid 237 mL  237 mL Oral BID BM Jennye Boroughs, MD   237 mL at 01/12/20 1333  . lidocaine (LIDODERM) 5 % 1 patch  1 patch Transdermal Q24H Blake Divine, MD   1 patch at 01/12/20 1556  . metoprolol tartrate (LOPRESSOR) tablet  25 mg  25 mg Oral BID Fritzi Mandes, MD   25 mg at 01/13/20 0945  . morphine 2 MG/ML injection 2 mg  2 mg Intravenous Q2H PRN Elwyn Reach, MD   2 mg at 01/10/20 0815  . multivitamin with minerals tablet 1 tablet  1 tablet Oral Daily Jennye Boroughs, MD      . ondansetron [facility]) tablet 4 mg  4 mg Oral Q6H PRN Elwyn Reach, MD       Or  . ondansetron (ZOFRAN) injection 4 mg  4 mg Intravenous Q6H PRN Elwyn Reach, MD   4 mg at 01/10/20 0815  . oxyCODONE (Oxy IR/ROXICODONE) immediate release tablet 5 mg  5 mg Oral Q6H PRN Lucrezia Starch, MD   5 mg at 01/12/20 1556  . phosphorus (K PHOS NEUTRAL) tablet 500 mg  500 mg Oral TID Jennye Boroughs, MD   500 mg at 01/12/20 2210     Discharge Medications: Please see discharge summary for a list of discharge medications.  Relevant Imaging Results:  Relevant Lab Results:   Additional  Information ss [id number]  Beverly Sessions, RN

## 2020-01-13 NOTE — Progress Notes (Signed)
Patient transported to the OR via bed 

## 2020-01-13 NOTE — Op Note (Signed)
01/13/2020  3:34 PM  PATIENT:  Laura David  86 y.o. female  PRE-OPERATIVE DIAGNOSIS:  left wrist fracture comminuted intra-articular  POST-OPERATIVE DIAGNOSIS:  left wrist fracture comminuted intra-articular  PROCEDURE:  Procedure(s): OPEN REDUCTION INTERNAL FIXATION (ORIF) WRIST FRACTURE (Left)  SURGEON: Laurene Footman, MD  ASSISTANTS: None  ANESTHESIA:   general  EBL:  Total I/O In: 150 [IV Piggyback:150] Out: 200 [Urine:200]  BLOOD ADMINISTERED:none  DRAINS: none   LOCAL MEDICATIONS USED:  NONE  SPECIMEN:  No Specimen  DISPOSITION OF SPECIMEN:  N/A  COUNTS:  YES  TOURNIQUET:   Total Tourniquet Time Documented: Forearm (Left) - 15 minutes Total: Forearm (Left) - 15 minutes   IMPLANTS: [employer] hand innovations short no DVR plate with multiple smooth pegs, threaded pegs and screws  DICTATION: .Dragon Dictation  patient was brought to the operating room and after adequate anesthesia was obtained the left arm was prepped and draped in usual sterile fashion. After patient identification and timeout procedure were completed fingertrap traction applied off the end of the table with a pounds of traction along with inflation of the tourniquet. The FCR tendon was the landmark for the volar incision the tendon sheath incised the tendon retracted radially. Deep fascia incised in the pronator was elevated off the distal shaft and distal fragment. With traction applied length was restored with a Soil scientist near anatomic alignment obtained. Distal first technique was utilized with smooth pegs placed after first pinning and checking position on C arm.  The distal screw holes were filled using standard technique drilling measuring and then placing either smooth pegs or threaded pegs depending on whether I felt it was going to catch the dorsal fragment followed by 3 cortical screws into the shaft and this gave anatomic alignment.    There were 3 distal fragments and they  appeared to be an occult tonically reduced at the close of the case the wrist was examined under fluoroscopy with traction removed and was stable. The wound was irrigated and the tourniquet let down. The wound was then closed with 3-0 Vicryl subcutaneously and 4-0 nylon for the skin in a simple interrupted manner. Xeroform 4 x 4 web roll and volar splint applied followed by an Ace wrap.  PLAN OF CARE: Continue as inpatient  PATIENT DISPOSITION:  PACU - hemodynamically stable.

## 2020-01-13 NOTE — Care Management Important Message (Signed)
Important Message  Patient Details  Name: Laura David MRN: [id number] Date of Birth: 1934/02/07   Medicare Important Message Given:  Yes     [name] 01/13/2020, 1:18 PM

## 2020-01-13 NOTE — Progress Notes (Signed)
Patient returned from Surgery

## 2020-01-13 NOTE — TOC Initial Note (Signed)
Transition of Care [facility]) - Initial/Assessment Note    Patient Details  Name: ALAISA David MRN: [id number] Date of Birth: 04/07/1934  Transition of [facility]) CM/SW Contact:    Beverly Sessions, RN Phone Number: 01/13/2020, 1:14 PM  Clinical Narrative:                 Patient admitted from home after fall sustaining multiple fractures.  Plan for ORIF on LUE today  Patient lives at at home alone.  Son at bedside.  Son states that he lives about 10 minutes from the patient   PCP [place]  Patient states that her neighbor takes her to and from appointments  Denies issues obtaining medications  Patient has a RW and cane in the home  PT has assessed patient and recommends SNF Patient and son in agreement  PASRR obtained  [place] sent for signature Bed search initiated  Patient will need to be seen again by PT after surgery            Patient Goals and CMS Choice        Expected Discharge Plan and Services                                                Prior Living Arrangements/Services                       Activities of Daily Living Home Assistive Devices/Equipment: Gilford Rile (specify type) ADL Screening (condition at time of admission) Patient's cognitive ability adequate to safely complete daily activities?: Yes Is the patient deaf or have difficulty hearing?: No Does the patient have difficulty seeing, even when wearing glasses/contacts?: No Does the patient have difficulty concentrating, remembering, or making decisions?: No Patient able to express need for assistance with ADLs?: Yes Does the patient have difficulty dressing or bathing?: Yes Independently performs ADLs?: No Communication: Independent Dressing (OT): Needs assistance Is this a change from baseline?: Change from baseline, expected to last >3 days Grooming: Needs assistance Is this a change from baseline?: Change from baseline, expected to last >3 days Feeding:  Independent Bathing: Needs assistance Is this a change from baseline?: Change from baseline, expected to last >3 days Toileting: Needs assistance Is this a change from baseline?: Change from baseline, expected to last >3days In/Out Bed: Needs assistance Is this a change from baseline?: Change from baseline, expected to last >3 days Walks in Home: Needs assistance Is this a change from baseline?: Pre-admission baseline Does the patient have difficulty walking or climbing stairs?: Yes Weakness of Legs: Both Weakness of Arms/Hands: None  Permission Sought/Granted                  Emotional Assessment              Admission diagnosis:  Hypokalemia [E87.6] Subdural hematoma ([place]) [S06.5X9A] Fall [W19.XXXA] Chest pain [R07.9] Closed fracture of left wrist, initial encounter [S62.102A] Closed fracture of proximal end of left humerus, unspecified fracture morphology, initial encounter [S42.202A] Patient Active Problem List   Diagnosis Date Noted  . Closed fracture of left wrist   . Coagulopathy ([place]) 01/09/2020  . Subdural hematoma ([place]) 01/09/2020  . Leucocytosis 01/09/2020  . Fall 01/09/2020  . Hypokalemia 01/09/2020  . Closed comminuted left humeral fracture 01/09/2020  . Mitral regurgitation 05/10/2013  . Tricuspid regurgitation 05/10/2013  .  Hypertension   . Hyperlipidemia   . PAF (paroxysmal atrial fibrillation) ([place])   . Osteopenia   . Cancer ([place])   . Asthma   . GERD (gastroesophageal reflux disease)   . Rickettsial disease   . Breast cancer ([place])    PCP:  [name], MD Pharmacy:   [facility], [address] [place] [address] Phone: [phone] Fax: [phone]  [facility] [place], [place] - [address] [place] [address] Phone: [phone] Fax: [phone]     Social Determinants of Health (SDOH)  Interventions    Readmission Risk Interventions No flowsheet data found.

## 2020-01-13 NOTE — Anesthesia Procedure Notes (Signed)
Procedure Name: LMA Insertion Date/Time: 01/13/2020 2:44 PM Performed by: Jonna Clark, CRNA Pre-anesthesia Checklist: Patient identified, Patient being monitored, Timeout performed, Emergency Drugs available and Suction available Patient Re-evaluated:Patient Re-evaluated prior to induction Oxygen Delivery Method: Circle system utilized Preoxygenation: Pre-oxygenation with 100% oxygen Induction Type: IV induction Ventilation: Mask ventilation without difficulty LMA: LMA inserted LMA Size: 3.0 Tube type: Oral Number of attempts: 1 Placement Confirmation: positive ETCO2 and breath sounds checked- equal and bilateral Tube secured with: Tape Dental Injury: Teeth and Oropharynx as per pre-operative assessment

## 2020-01-13 NOTE — NC FL2 (Signed)
[place] LEVEL OF CARE SCREENING TOOL     IDENTIFICATION  Patient Name: Laura David Birthdate: 01/23/1934 Sex: female Admission Date (Current Location): 01/09/2020  [facility] and [place] Number:  Engineering geologist and Address:         Provider Number: [id number]  Attending Physician Name and Address:  Jennye Boroughs, MD  Relative Name and Phone Number:       Current Level of Care: Hospital Recommended Level of Care: [place] Prior Approval Number:    Date Approved/Denied:   PASRR Number: [id number]  Discharge Plan: SNF    Current Diagnoses: Patient Active Problem List   Diagnosis Date Noted  . Closed fracture of left wrist   . Coagulopathy ([place]) 01/09/2020  . Subdural hematoma ([place]) 01/09/2020  . Leucocytosis 01/09/2020  . Fall 01/09/2020  . Hypokalemia 01/09/2020  . Closed comminuted left humeral fracture 01/09/2020  . Mitral regurgitation 05/10/2013  . Tricuspid regurgitation 05/10/2013  . Hypertension   . Hyperlipidemia   . PAF (paroxysmal atrial fibrillation) ([place])   . Osteopenia   . Cancer ([place])   . Asthma   . GERD (gastroesophageal reflux disease)   . Rickettsial disease   . Breast cancer ([place])     Orientation RESPIRATION BLADDER Height & Weight     Self,Time,Situation,Place (intermittent confusion)  Normal Continent Weight: 45.4 kg Height:  5' (152.4 cm)  BEHAVIORAL SYMPTOMS/MOOD NEUROLOGICAL BOWEL NUTRITION STATUS      Continent Diet (NPO for procedure.  will advance)  AMBULATORY STATUS COMMUNICATION OF NEEDS Skin   Extensive Assist Verbally Bruising (Plan for surgery on LUE.  will have incision)                       Personal Care Assistance Level of Assistance              Functional Limitations Info             SPECIAL CARE FACTORS FREQUENCY  PT (By licensed PT),OT (By licensed OT)                    Contractures Contractures Info: Not present    Additional Factors Info   Code Status,Allergies Code Status Info: DNR Allergies Info: Flagyl , Pacerone, Ramipril           Current Medications (01/13/2020):  This is the current hospital active medication list Current Facility-Administered Medications  Medication Dose Route Frequency Provider Last Rate Last Admin  . acetaminophen (TYLENOL) tablet 650 mg  650 mg Oral Q6H PRN Elwyn Reach, MD   650 mg at 01/12/20 1333   Or  . acetaminophen (TYLENOL) suppository 650 mg  650 mg Rectal Q6H PRN Gala Romney L, MD      . amLODipine (NORVASC) tablet 5 mg  5 mg Oral Daily Fritzi Mandes, MD   5 mg at 01/12/20 1037  . atorvastatin (LIPITOR) tablet 10 mg  10 mg Oral Daily Fritzi Mandes, MD   10 mg at 01/12/20 1037  . ceFAZolin (ANCEF) IVPB 1 g/50 mL premix  1 g Intravenous Once Hessie Knows, MD      . feeding supplement (ENSURE ENLIVE / ENSURE PLUS) liquid 237 mL  237 mL Oral BID BM Jennye Boroughs, MD   237 mL at 01/12/20 1333  . lidocaine (LIDODERM) 5 % 1 patch  1 patch Transdermal Q24H Blake Divine, MD   1 patch at 01/12/20 1556  . metoprolol tartrate (LOPRESSOR) tablet 25  mg  25 mg Oral BID Fritzi Mandes, MD   25 mg at 01/13/20 0945  . morphine 2 MG/ML injection 2 mg  2 mg Intravenous Q2H PRN Elwyn Reach, MD   2 mg at 01/10/20 0815  . multivitamin with minerals tablet 1 tablet  1 tablet Oral Daily Jennye Boroughs, MD      . ondansetron [facility]) tablet 4 mg  4 mg Oral Q6H PRN Elwyn Reach, MD       Or  . ondansetron (ZOFRAN) injection 4 mg  4 mg Intravenous Q6H PRN Elwyn Reach, MD   4 mg at 01/10/20 0815  . oxyCODONE (Oxy IR/ROXICODONE) immediate release tablet 5 mg  5 mg Oral Q6H PRN Lucrezia Starch, MD   5 mg at 01/12/20 1556  . phosphorus (K PHOS NEUTRAL) tablet 500 mg  500 mg Oral TID Jennye Boroughs, MD   500 mg at 01/12/20 2210     Discharge Medications: Please see discharge summary for a list of discharge medications.  Relevant Imaging Results:  Relevant Lab Results:   Additional  Information ss [id number]  Beverly Sessions, RN

## 2020-01-13 NOTE — Anesthesia Preprocedure Evaluation (Addendum)
Anesthesia Evaluation  Patient identified by MRN, date of birth, ID band Patient awake  General Assessment Comment:Coming for wrist repair after fall. Also has left shoulder fracture. Has very small subdural hemorrhage; per neurosurgery, should not affect anything and no contraindications to surgery or anesthesia  Reviewed: Allergy & Precautions, NPO status , Patient's Chart, lab work & pertinent test results  History of Anesthesia Complications Negative for: history of anesthetic complications  Airway Mallampati: II  TM Distance: >3 FB Neck ROM: Full   Comment: Large bruise over left orbital region Dental  (+) Missing, Partial Lower, Partial Upper, Poor Dentition   Pulmonary neg pulmonary ROS, neg sleep apnea, neg COPD, Patient abstained from smoking.Not current smoker,  Childhood asthma   Pulmonary exam normal breath sounds clear to auscultation       Cardiovascular Exercise Tolerance: Poor METShypertension, (-) CAD and (-) Past MI + dysrhythmias Atrial Fibrillation + Valvular Problems/Murmurs AI and MR  Rhythm:Irregular Rate:Normal - Systolic murmurs TTE 2683: INTERPRETATION  NORMAL LEFT VENTRICULAR SYSTOLIC FUNCTION  NORMAL RIGHT VENTRICULAR SYSTOLIC FUNCTION  MODERATE VALVULAR REGURGITATION (See above)  NO VALVULAR STENOSIS  MODERATE AR, MR, TR  EF 50%     Neuro/Psych Small subdural hemorrhage negative psych ROS   GI/Hepatic GERD  ,(+)     (-) substance abuse  ,   Endo/Other  neg diabetes  Renal/GU negative Renal ROS     Musculoskeletal   Abdominal   Peds  Hematology  (+) Blood dyscrasia, , INR 1.8 today   Anesthesia Other Findings Past Medical History: No date: Asthma 2010: Breast cancer ([place])     Comment:  RIGHT No date: Cancer ([place])     Comment:  basel cell, s/p MOH'S/DR. DASHER 06/12/11: GERD (gastroesophageal reflux disease)     Comment:  EGD NORMAL 2014: Hx of mammogram     Comment:   NORMAL No date: Hyperlipidemia No date: Hypertension No date: Osteopenia No date: PAF (paroxysmal atrial fibrillation) (H[facility]) No date: Rickettsial disease     Comment:  [place] x 18 months  Reproductive/Obstetrics                            Anesthesia Physical Anesthesia Plan  ASA: III  Anesthesia Plan: General   Post-op Pain Management:    Induction: Intravenous  PONV Risk Score and Plan: 3 and Ondansetron and Dexamethasone  Airway Management Planned: LMA  Additional Equipment: None  Intra-op Plan:   Post-operative Plan: Extubation in OR  Informed Consent: I have reviewed the patients History and Physical, chart, labs and discussed the procedure including the risks, benefits and alternatives for the proposed anesthesia with the patient or authorized representative who has indicated his/her understanding and acceptance.   Patient has DNR.  Discussed DNR with patient and Suspend DNR.   Dental advisory given  Plan Discussed with: CRNA and Surgeon  Anesthesia Plan Comments: (Discussed risks of anesthesia with patient and son Lennette Bihari at bedside, including PONV, sore throat, lip/dental damage. Rare risks discussed as well, such as cardiorespiratory and neurological sequelae. Patient understands. Patient counseled on being higher risk for anesthesia due to comorbidities: age, elevated INR. Patient was told about increased risk of cardiac and respiratory events, including death. Patient understands. )       Anesthesia Quick Evaluation

## 2020-01-14 DIAGNOSIS — W19XXXD Unspecified fall, subsequent encounter: Secondary | ICD-10-CM | POA: Diagnosis not present

## 2020-01-14 DIAGNOSIS — S065X9A Traumatic subdural hemorrhage with loss of consciousness of unspecified duration, initial encounter: Secondary | ICD-10-CM | POA: Diagnosis not present

## 2020-01-14 DIAGNOSIS — I480 Paroxysmal atrial fibrillation: Secondary | ICD-10-CM | POA: Diagnosis not present

## 2020-01-14 DIAGNOSIS — S42352D Displaced comminuted fracture of shaft of humerus, left arm, subsequent encounter for fracture with routine healing: Secondary | ICD-10-CM | POA: Diagnosis not present

## 2020-01-14 LAB — BASIC METABOLIC PANEL
Anion gap: 10 (ref 5–15)
BUN: 13 mg/dL (ref 8–23)
CO2: 28 mmol/L (ref 22–32)
Calcium: 9.5 mg/dL (ref 8.9–10.3)
Chloride: 99 mmol/L (ref 98–111)
Creatinine, Ser: 0.5 mg/dL (ref 0.44–1.00)
GFR, Estimated: >60 mL/min (ref >60)
Glucose, Bld: 148 mg/dL — ABNORMAL HIGH (ref 70–99)
Potassium: 3.6 mmol/L (ref 3.5–5.1)
Sodium: 137 mmol/L (ref 135–145)

## 2020-01-14 LAB — PROTIME-INR
INR: 1.6 — ABNORMAL HIGH (ref 0.8–1.2)
Prothrombin Time: 18.7 seconds — ABNORMAL HIGH (ref 11.4–15.2)

## 2020-01-14 LAB — CBC WITH DIFFERENTIAL/PLATELET
Abs Immature Granulocytes: 0.05 10*3/uL (ref 0.00–0.07)
Basophils Absolute: 0 10*3/uL (ref 0.0–0.1)
Basophils Relative: 1 %
Eosinophils Absolute: 0 10*3/uL (ref 0.0–0.5)
Eosinophils Relative: 1 %
HCT: 37.8 % (ref 36.0–46.0)
Hemoglobin: 12.7 g/dL (ref 12.0–15.0)
Immature Granulocytes: 1 %
Lymphocytes Relative: 7 %
Lymphs Abs: 0.6 10*3/uL — ABNORMAL LOW (ref 0.7–4.0)
MCH: 30.5 pg (ref 26.0–34.0)
MCHC: 33.6 g/dL (ref 30.0–36.0)
MCV: 90.6 fL (ref 80.0–100.0)
Monocytes Absolute: 0.9 10*3/uL (ref 0.1–1.0)
Monocytes Relative: 10 %
Neutro Abs: 7.3 10*3/uL (ref 1.7–7.7)
Neutrophils Relative %: 80 %
Platelets: 316 10*3/uL (ref 150–400)
RBC: 4.17 MIL/uL (ref 3.87–5.11)
RDW: 12.9 % (ref 11.5–15.5)
WBC: 8.9 10*3/uL (ref 4.0–10.5)
nRBC: 0 % (ref 0.0–0.2)

## 2020-01-14 LAB — MAGNESIUM: Magnesium: 1.8 mg/dL (ref 1.7–2.4)

## 2020-01-14 LAB — PHOSPHORUS: Phosphorus: 1.8 mg/dL — ABNORMAL LOW (ref 2.5–4.6)

## 2020-01-14 MED ORDER — K PHOS MONO-SOD PHOS DI & MONO 155-852-130 MG PO TABS
500.0000 mg | ORAL_TABLET | Freq: Three times a day (TID) | ORAL | Status: AC
  Administered 2020-01-14 – 2020-01-15 (×6): 500 mg via ORAL
  Filled 2020-01-14 (×6): qty 2

## 2020-01-14 MED ORDER — SODIUM CHLORIDE 0.9 % IV SOLN
INTRAVENOUS | Status: DC | PRN
  Administered 2020-01-14: 250 mL via INTRAVENOUS

## 2020-01-14 NOTE — Evaluation (Signed)
Occupational Therapy Re-Evaluation Patient Details Name: Laura David MRN: [id number] DOB: 1934/11/21 Today's Date: 01/14/2020    History of Present Illness Pt admitted for acute subdural hemorrhage and L UE fractures following fall at grocery store. Per neuro-hemorrhage stable and cleared to work with therapy. Pt underwent ORIF of L wrist on 1/7. Cleared to PWB through L shoulder to use platform walker. History includes breast cancer, GERD, and HTN.   Clinical Impression   Pt seen for OT Re-Evaluation on this date. Pt now s/p L ORIF distal radius fx, and has orders to remain PWB through her LUE with use of a platform walker. Pt received semi-supine in bed. Per chart/RN report pt has been generally confused, but oriented to self and place. Pt speaks t/o session about concerns with her family being mad at her and requires re-direction to task/topic at hand. Her cognition presents as functionally impaired from time of initial OT evaluation. Will continue to monitor during therapy sessions. RN updated/aware. She is mildly pain limited in her LUE, but is able to advance mobility to trial STS from EOB with MIN A and 1 person hand held assist. Therapist adjusts sling for improved comfort, positioning, and skin integrity. Pt making good progress toward goals and continues to benefit from skilled OT services to maximize return to PLOF and minimize risk of future falls, injury, caregiver burden, and readmission. POC updated. Discharge recommendation remains appropriate.      Follow Up Recommendations  SNF;Supervision/Assistance - 24 hour    Equipment Recommendations  3 in 1 bedside commode    Recommendations for Other Services       Precautions / Restrictions Precautions Precautions: Fall Precaution Comments: Pt pending formal ortho consult. Per secure chat with Dr. Serita Grit, pt should remain NWB in sling for the shoulder with limited to no RoM and NWB through wrist with splint. Required Braces or  Orthoses: Sling;Splint/Cast Restrictions LUE Weight Bearing: Partial weight bearing LUE Partial Weight Bearing Percentage or Pounds: Per MD: "Can use left forearm platform attachment with partial weightbearing on left shoulder as tolerated" Other Position/Activity Restrictions: L humerus fx and L radius s/p ORIF      Mobility Bed Mobility Overal bed mobility: Needs Assistance Bed Mobility: Supine to Sit;Sit to Supine     Supine to sit: Min assist Sit to supine: Mod assist   General bed mobility comments: Min A for sup>sit to advance hips forward. MOD A for BLE mgt during sit>sup. Pt able to bridge hips independently in bed and performs bed boost with Min A and cueing for sequencing.    Transfers Overall transfer level: Needs assistance Equipment used: 1 person hand held assist Transfers: Sit to/from Stand Sit to Stand: Min assist         General transfer comment: Min A to STS with therapist supporting on R side, LUE in sling. Pt able to take 2-3 short steps forward from EOB with CGA, then c/o "feeling woozy" requires min A to step back to bed.    Balance Overall balance assessment: Needs assistance;History of Falls Sitting-balance support: Feet supported;No upper extremity supported Sitting balance-Leahy Scale: Fair Sitting balance - Comments: Steady static sitting at EOB. One instance of Posterior LOB when attempting to adjust gown with RUE. Requires tactile cueing and MOD A to regain balance. Postural control: Posterior lean Standing balance support: During functional activity Standing balance-Leahy Scale: Poor Standing balance comment: Min A to CGA to maintain static standing balance at EOB.  ADL either performed or assessed with clinical judgement   ADL Overall ADL's : Needs assistance/impaired Eating/Feeding: Bed level;Minimal assistance Eating/Feeding Details (indicate cue type and reason): Min A for assist with 2-handed  self-feeding tasks. Pt is RUE dominant, and is able to use her RUE during session to hold cups, etc. Currently bed-level for all ADL management 2/2 pain.                                 Functional mobility during ADLs: Minimal assistance;Rolling walker;Cueing for safety;Cueing for sequencing;Moderate assistance Investment banker, operational(Platform Walker) General ADL Comments: Pt able to perform bed/functional mobility with MIN-MOD A this date. She requires consistent cueing for safety/sequencing t/o sesession 2/2 impaired cognition.     Vision Baseline Vision/History: Wears glasses Wears Glasses: At all times Patient Visual Report: No change from baseline       Perception     Praxis      Pertinent Vitals/Pain Pain Assessment: Faces Faces Pain Scale: Hurts even more Pain Location: L arm primarily and some L LE with mobility Pain Descriptors / Indicators: Aching;Constant;Discomfort;Guarding;Grimacing Pain Intervention(s): Limited activity within patient's tolerance;Monitored during session;Repositioned;Premedicated before session     Hand Dominance Right   Extremity/Trunk Assessment Upper Extremity Assessment Upper Extremity Assessment: LUE deficits/detail;RUE deficits/detail;Generalized weakness RUE Deficits / Details: improved tolerance for RUE mobility/activity. RUE remains generally weak grossly 3+/5 t/o. LUE Deficits / Details: Pt now s/p ORIF L wrist. Very pain limited. LUE: Unable to fully assess due to pain;Unable to fully assess due to immobilization LUE Coordination: decreased gross motor;decreased fine motor   Lower Extremity Assessment Lower Extremity Assessment: Generalized weakness   Cervical / Trunk Assessment Cervical / Trunk Assessment: Normal   Communication Communication Communication: No difficulties   Cognition Arousal/Alertness: Awake/alert Behavior During Therapy: WFL for tasks assessed/performed Overall Cognitive Status: Impaired/Different from baseline Area  of Impairment: Following commands;Awareness;Orientation;Safety/judgement                 Orientation Level: Disoriented to;Time;Situation     Following Commands: Follows one step commands with increased time Safety/Judgement: Decreased awareness of safety Awareness: Emergent   General Comments: Pt oriented to self and place only. She perseverates on concerns her family is mad at her, but is able to be re-directed to task/topic. This is different from her initial evaluation when she was A&Ox4.   General Comments       Exercises Other Exercises Other Exercises: OT facilitates bed/functional mobility with cueing for safety, sequencing, and technique t/o session. Sup<>sit, STS, Standing marches, Stepping at EOB. See above for additional detail.   Shoulder Instructions      Home Living Family/patient expects to be discharged to:: Private residence Living Arrangements: Alone Available Help at Discharge: Family;Available PRN/intermittently Type of Home: House Home Access: Stairs to enter [employer] CorporationEntrance Stairs-Number of Steps: 3 Entrance Stairs-Rails: Can reach both Home Layout: One level               Home Equipment: Cane - single point          Prior Functioning/Environment Level of Independence: Independent with assistive device(s)        Comments: Uses SPC for community distances, typically indep in home. Reports no recent falls.        OT Problem List: Decreased strength;Decreased coordination;Pain;Decreased activity tolerance;Decreased safety awareness;Decreased range of motion;Impaired balance (sitting and/or standing);Decreased knowledge of use of DME or AE;Impaired UE functional use;Decreased knowledge of precautions  OT Treatment/Interventions: Self-care/ADL training;Therapeutic exercise;Therapeutic activities;Energy conservation;DME and/or AE instruction;Patient/family education;Balance training    OT Goals(Current goals can be found in the care plan  section) Acute Rehab OT Goals Patient Stated Goal: To go home OT Goal Formulation: With patient Time For Goal Achievement: 01/28/20 Potential to Achieve Goals: Good  OT Frequency: Min 1X/week   Barriers to D/C: Inaccessible home environment;Decreased caregiver support          Co-evaluation              AM-PAC OT "6 Clicks" Daily Activity     Outcome Measure Help from another person eating meals?: A Little Help from another person taking care of personal grooming?: A Lot Help from another person toileting, which includes using toliet, bedpan, or urinal?: A Lot Help from another person bathing (including washing, rinsing, drying)?: A Lot Help from another person to put on and taking off regular upper body clothing?: A Lot Help from another person to put on and taking off regular lower body clothing?: A Lot 6 Click Score: 13   End of Session Equipment Utilized During Treatment: Gait belt Nurse Communication: Mobility status;Patient requests pain meds;Weight bearing status;Other (comment)  Activity Tolerance: Patient tolerated treatment well Patient left: in bed;with call bell/phone within reach;with nursing/sitter in room;with bed alarm set  OT Visit Diagnosis: Other abnormalities of gait and mobility (R26.89);Muscle weakness (generalized) (M62.81);Pain Pain - Right/Left: Left Pain - part of body: Shoulder;Arm;Hand;Leg                Time: 8502-7741 OT Time Calculation (min): 28 min Charges:  OT General Charges $OT Visit: 1 Visit OT Evaluation $OT Re-eval: 1 Re-eval OT Treatments $Self Care/Home Management : 8-22 mins  Shara Blazing, M.S., OTR/L Ascom: [phone] 01/14/20, 10:47 AM

## 2020-01-14 NOTE — Evaluation (Signed)
Physical Therapy Evaluation Patient Details Name: Laura David MRN: [id number] DOB: 1934-09-21 Today's Date: 01/14/2020   History of Present Illness  Pt admitted for acute subdural hemorrhage and L UE fractures following fall at grocery store. Per neuro-hemorrhage stable and cleared to work with therapy. Pt underwent ORIF of L wrist on 1/7. Cleared to PWB through L shoulder to use platform walker. History includes breast cancer, GERD, and HTN.  Clinical Impression  This note serves as re-evaluation as pt is now s/p ORIF L wrist with updated LUE WB precautions of PWB through LUE with platform attachment. This date the pt is able to tolerate stand pivot transfer to bedside commode, dependent for hygiene. The pt's greatest limiting factor continues to be her pain. She and her son were educated on the proper use of the platform RW (PFRW) and the son reports understanding. PT will continue to follow to optimize functional mobility.     Follow Up Recommendations SNF    Equipment Recommendations       Recommendations for Other Services       Precautions / Restrictions Precautions Precautions: Fall Precaution Comments: Pt pending formal ortho consult. Per secure chat with Dr. Serita Grit, pt should remain NWB in sling for the shoulder with limited to no RoM and NWB through wrist with splint. Required Braces or Orthoses: Sling;Splint/Cast Restrictions Weight Bearing Restrictions: Yes LUE Weight Bearing: Partial weight bearing LUE Partial Weight Bearing Percentage or Pounds: with use of platform RW Other Position/Activity Restrictions: L humerus fx and L radius s/p ORIF      Mobility  Bed Mobility Overal bed mobility: Needs Assistance Bed Mobility: Supine to Sit;Sit to Supine     Supine to sit: Mod assist Sit to supine: Mod assist   General bed mobility comments: Min A for sup>sit to advance hips forward. MOD A for BLE mgt during sit>sup. Pt able to bridge hips independently in bed and  performs bed boost with Min A and cueing for sequencing.    Transfers Overall transfer level: Needs assistance Equipment used:  (Attempted use of PFRW, however the pt presents with limited tolerance to LUE movement to place on platform attachment. Proceeded with 1 person assist) Transfers: Sit to/from [employer] to Stand: Min assist Stand pivot transfers: Min assist (Pt requiring increased time for transfer to/from bedside commode.)       General transfer comment: Min A to STS with therapist supporting on R side, LUE in sling. Pt able to take 2-3 short steps forward from EOB with CGA, then c/o "feeling woozy" requires min A to step back to bed.  Ambulation/Gait                Stairs            Wheelchair Mobility    Modified Rankin (Stroke Patients Only)       Balance Overall balance assessment: Needs assistance;History of Falls Sitting-balance support: Feet supported;Single extremity supported Sitting balance-Leahy Scale: Good Sitting balance - Comments: Steady static sitting at EOB. One instance of Posterior LOB when attempting to adjust gown with RUE. Requires tactile cueing and MOD A to regain balance. Postural control: Posterior lean Standing balance support: Single extremity supported Standing balance-Leahy Scale: Poor Standing balance comment: Min A to CGA to maintain static standing balance at EOB.                             Pertinent Vitals/Pain Pain  Assessment: 0-10 Pain Score: 8  Faces Pain Scale: Hurts even more Pain Location: L arm primarily and some L LE with mobility Pain Descriptors / Indicators: Aching;Constant;Discomfort;Guarding;Grimacing Pain Intervention(s): Monitored during session;Patient requesting pain meds-RN notified;Limited activity within patient's tolerance    Home Living Family/patient expects to be discharged to:: Private residence Living Arrangements: Alone Available Help at Discharge:  Family;Available PRN/intermittently Type of Home: House Home Access: Stairs to enter Entrance Stairs-Rails: Can reach both Entrance Stairs-Number of Steps: 3 Home Layout: One level Home Equipment: Cane - single point      Prior Function Level of Independence: Independent with assistive device(s)         Comments: Uses SPC for community distances, typically indep in home. Reports no recent falls.     Hand Dominance   Dominant Hand: Right    Extremity/Trunk Assessment   Upper Extremity Assessment Upper Extremity Assessment: Defer to OT evaluation RUE Deficits / Details: improved tolerance for RUE mobility/activity. RUE remains generally weak grossly 3+/5 t/o. RUE Sensation: WNL LUE Deficits / Details: Pt now s/p ORIF L wrist. Very pain limited. LUE: Unable to fully assess due to pain;Unable to fully assess due to immobilization LUE Coordination: decreased gross motor;decreased fine motor    Lower Extremity Assessment Lower Extremity Assessment: Overall WFL for tasks assessed    Cervical / Trunk Assessment Cervical / Trunk Assessment: Normal  Communication   Communication: No difficulties  Cognition Arousal/Alertness: Awake/alert Behavior During Therapy: WFL for tasks assessed/performed Overall Cognitive Status: Impaired/Different from baseline Area of Impairment: Problem solving;Awareness                 Orientation Level: Time;Situation     Following Commands: Follows one step commands consistently Safety/Judgement: Decreased awareness of deficits Awareness: Emergent   General Comments: Pt oriented to self and place only. She perseverates on concerns her family is mad at her, but is able to be re-directed to task/topic. This is different from her initial evaluation when she was A&Ox4.      General Comments      Exercises Other Exercises Other Exercises: OT facilitates bed/functional mobility with cueing for safety, sequencing, and technique t/o  session. Sup<>sit, STS, Standing marches, Stepping at EOB. See above for additional detail.   Assessment/Plan    PT Assessment Patient needs continued PT services  PT Problem List Decreased strength;Decreased balance;Decreased mobility;Decreased activity tolerance;Pain;Decreased knowledge of use of DME       PT Treatment Interventions DME instruction;Gait training;Therapeutic activities;Therapeutic exercise;Balance training    PT Goals (Current goals can be found in the Care Plan section)  Acute Rehab PT Goals Patient Stated Goal: To go home PT Goal Formulation: With patient Time For Goal Achievement: 01/24/20 Potential to Achieve Goals: Fair    Frequency 7X/week   Barriers to discharge        Co-evaluation               AM-PAC PT "6 Clicks" Mobility  Outcome Measure Help needed turning from your back to your side while in a flat bed without using bedrails?: A Lot Help needed moving from lying on your back to sitting on the side of a flat bed without using bedrails?: A Lot Help needed moving to and from a bed to a chair (including a wheelchair)?: A Little Help needed standing up from a chair using your arms (e.g., wheelchair or bedside chair)?: A Little Help needed to walk in hospital room?: A Lot Help needed climbing 3-5 steps with a railing? :  Total 6 Click Score: 13    End of Session   Activity Tolerance: Patient limited by pain Patient left: in bed;with bed alarm set;with family/visitor present;with call bell/phone within reach Nurse Communication: Mobility status PT Visit Diagnosis: Muscle weakness (generalized) (M62.81);History of falling (Z91.81);Difficulty in walking, not elsewhere classified (R26.2);Pain Pain - Right/Left: Left Pain - part of body: Arm    Time: 1013-1100 PT Time Calculation (min) (ACUTE ONLY): 47 min   Charges:   PT Evaluation $PT Re-evaluation: 1 Re-eval PT Treatments $Therapeutic Activity: 23-37 mins       12:52 PM,  01/14/20 Donzel Romack A. Saverio Danker PT, DPT Physical Therapist - [facility]   Darrow Barreiro A Wenda Vanschaick 01/14/2020, 12:50 PM

## 2020-01-14 NOTE — Progress Notes (Signed)
   Subjective: 1 Day Post-Op Procedure(s) (LRB): OPEN REDUCTION INTERNAL FIXATION (ORIF) WRIST FRACTURE (Left) Patient reports pain as mild.   Patient is well, and has had no acute complaints or problems We will start physical therapy today.    Objective: Vital signs in last 24 hours: Temp:  [97.5 F (36.4 C)-98.9 F (37.2 C)] 97.6 F (36.4 C) (01/08 0304) Pulse Rate:  [80-111] 99 (01/08 0304) Resp:  [14-20] 20 (01/08 0304) BP: (92-172)/(67-97) 127/88 (01/08 0304) SpO2:  [96 %-100 %] 99 % (01/08 0304) Weight:  [49.9 kg] 49.9 kg (01/07 1402)  Intake/Output from previous day: 01/07 0701 - 01/08 0700 In: 400 [I.V.:250; IV Piggyback:150] Out: 200 [Urine:200] Intake/Output this shift: No intake/output data recorded.  Recent Labs    01/14/20 0437  HGB 12.7   Recent Labs    01/14/20 0437  WBC 8.9  RBC 4.17  HCT 37.8  PLT 316   Recent Labs    01/13/20 0439 01/14/20 0437  NA 138 137  K 3.9 3.6  CL 99 99  CO2 29 28  BUN 15 13  CREATININE 0.66 0.50  GLUCOSE 115* 148*  CALCIUM 8.8* 9.5   Recent Labs    01/13/20 0439 01/14/20 0437  INR 1.8* 1.6*    EXAM General - Patient is Alert, Appropriate and Oriented Left upper Extremity - Neurovascular intact Sensation intact distally Intact pulses distally No cellulitis present Compartment soft volar splint intact with out drainage. able to move fingers and make a fist Dressing - dressing C/D/I  Past Medical History:  Diagnosis Date  . Asthma   . Breast cancer ([place]) 2010   RIGHT  . Cancer ([facility])    basel cell, s/p MOH'S/DR. DASHER  . GERD (gastroesophageal reflux disease) 06/12/11   EGD NORMAL  . Hx of mammogram 2014   NORMAL  . Hyperlipidemia   . Hypertension   . Osteopenia   . PAF (paroxysmal atrial fibrillation) ([place])   . 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   Weight as of this encounter: 49.9 kg. Advance diet Up with therapy, PWB LUE with platform walker Follow up with [facility] orthopedics first of next week for dressing change, wound check Elevate LUE, work on LUE digit ROM    T. Rachelle Hora, PA-C [place] 01/14/2020, 8:53 AM

## 2020-01-14 NOTE — Progress Notes (Signed)
Progress Note    Laura David  KGY:[id number] DOB: [birth year]  DOA: 01/09/2020 PCP: [name], MD      Brief Narrative:    Medical records reviewed and are as summarized below:  Laura David is a 86 y.o. female with medical history including but not limited to breast cancer, GERD, hyperlipidemia, hypertension, paroxysmal atrial fibrillation on chronic warfarin therapy, basal cell carcinoma, who came to the hospital after sustaining a mechanical fall at home.      Assessment/Plan:   Principal Problem:   Fall Active Problems:   Hypertension   Hyperlipidemia   PAF (paroxysmal atrial fibrillation) (HCC)   Asthma   GERD (gastroesophageal reflux disease)   Breast cancer (HCC)   Coagulopathy (HCC)   Subdural hematoma (HCC)   Leucocytosis   Hypokalemia   Closed comminuted left humeral fracture    Body mass index is 21.48 kg/m.     S/p mechanical fall leading to mildly comminuted fracture of left proximal left humerus, comminuted, intra-articular fracture of left distal radius, acute mildly depressed fracture of left anterior maxillary sinus wall: s/p open reduction internal fixation of left wrist fracture.  Continue analgesics for pain.  Subdural hemorrhage: Repeat CT head x2 bleeding showed that subdural bleed is stable.  Coumadin has been held.  Neurosurgeon has evaluated the patient and recommended holding Coumadin for a week.  DVT prophylaxis is okay as needed per neurosurgeon.  Paroxysmal atrial fibrillation: Coumadin has been held because of subdural hemorrhage.   Hypokalemia, hypomagnesemia and hypophosphatemia: Improved  Hypertension: Continue antihypertensives   Leukocytosis: Improved.   Diet Order            Diet regular Room service appropriate? Yes; Fluid consistency: Thin  Diet effective now                    Consultants:  Orthopedic surgeon  Procedures:  None    Medications:   . amLODipine  5 mg Oral Daily  .  atorvastatin  10 mg Oral Daily  . feeding supplement  237 mL Oral BID BM  . lidocaine  1 patch Transdermal Q24H  . metoprolol tartrate  25 mg Oral BID  . multivitamin with minerals  1 tablet Oral Daily  . phosphorus  500 mg Oral TID   Continuous Infusions: . sodium chloride Stopped (01/14/20 1310)     Anti-infectives (From admission, onward)   Start     Dose/Rate Route Frequency Ordered Stop   01/13/20 2300  ceFAZolin (ANCEF) IVPB 1 g/50 mL premix        1 g 100 mL/hr over 30 Minutes Intravenous Every 8 hours 01/13/20 1710 01/14/20 1340   01/13/20 1500  ceFAZolin (ANCEF) IVPB 1 g/50 mL premix        1 g 100 mL/hr over 30 Minutes Intravenous  Once 01/13/20 0753 01/13/20 1516             Family Communication/Anticipated D/C date and plan/Code Status   DVT prophylaxis: SCDs Start: 01/09/20 2120     Code Status: DNR  Family Communication: None Disposition Plan:    Status is: Inpatient  Remains inpatient appropriate because:Unsafe d/c plan and Inpatient level of care appropriate due to severity of illness   Dispo: The patient is from: Home              Anticipated d/c is to: SNF              Anticipated d/c date is: >  3 days              Patient currently is not medically stable to d/c.           Subjective:   Interval events noted.  She complains of pain in the left wrist and left shoulder.  Objective:    Vitals:   01/14/20 0003 01/14/20 0304 01/14/20 0304 01/14/20 1229  BP: 135/78 127/88  (!) 144/72  Pulse: 85 (!) 111 99 90  Resp: 18 20  16   Temp: (!) 97.5 F (36.4 C) 97.6 F (36.4 C)  97.9 F (36.6 C)  TempSrc: Oral Oral    SpO2: 97% 98% 99% 99%  Weight:      Height:       No data found.   Intake/Output Summary (Last 24 hours) at 01/14/2020 1527 Last data filed at 01/14/2020 1413 Gross per 24 hour  Intake 301.04 ml  Output --  Net 301.04 ml   Filed Weights   01/09/20 1233 01/13/20 1402  Weight: 45.4 kg 49.9 kg     Exam:   GEN: NAD SKIN: Bruises on the left lateral thigh and left arm EYES: No pallor or icterus.  Left periorbital bruising.  Left subconjunctival hemorrhage. ENT: MMM CV: RRR PULM: CTA B ABD: soft, ND, NT, +BS CNS: AAO x 3, non focal EXT: Dressing on left wrist and forearm is clean, dry and intact.     Data Reviewed:   I have personally reviewed following labs and imaging studies:  Labs: Labs show the following:   Basic Metabolic Panel: Recent Labs  Lab 01/09/20 1445 01/10/20 0439 01/12/20 0612 01/13/20 0439 01/14/20 0437  NA 140 137 133* 138 137  K 3.1* 3.4* 3.2* 3.9 3.6  CL 101 101 95* 99 99  CO2 23 27 28 29 28   GLUCOSE 139* 92 97 115* 148*  BUN 17 12 13 15 13   CREATININE 0.83 0.66 0.73 0.66 0.50  CALCIUM 9.8 8.6* 9.0 8.8* 9.5  MG  --   --  1.6* 1.9 1.8  PHOS  --   --  1.6* 3.2 1.8*   GFR Estimated Creatinine Clearance: 38.3 mL/min (by C-G formula based on SCr of 0.5 mg/dL). Liver Function Tests: Recent Labs  Lab 01/09/20 1445 01/10/20 0439  AST 28 21  ALT 14 12  ALKPHOS 64 51  BILITOT 1.1 1.5*  PROT 7.9 6.6  ALBUMIN 4.4 3.7   No results for input(s): LIPASE, AMYLASE in the last 168 hours. No results for input(s): AMMONIA in the last 168 hours. Coagulation profile Recent Labs  Lab 01/09/20 1445 01/11/20 0325 01/12/20 0612 01/13/20 0439 01/14/20 0437  INR 2.4* 3.0* 2.8* 1.8* 1.6*    CBC: Recent Labs  Lab 01/09/20 1445 01/10/20 0439 01/14/20 0437  WBC 17.6* 9.8 8.9  NEUTROABS 15.5*  --  7.3  HGB 13.8 11.2* 12.7  HCT 41.5 33.8* 37.8  MCV 90.6 91.6 90.6  PLT 251 198 316   Cardiac Enzymes: No results for input(s): CKTOTAL, CKMB, CKMBINDEX, TROPONINI in the last 168 hours. BNP (last 3 results) No results for input(s): PROBNP in the last 8760 hours. CBG: No results for input(s): GLUCAP in the last 168 hours. D-Dimer: No results for input(s): DDIMER in the last 72 hours. Hgb A1c: No results for input(s): HGBA1C in the last  72 hours. Lipid Profile: No results for input(s): CHOL, HDL, LDLCALC, TRIG, CHOLHDL, LDLDIRECT in the last 72 hours. Thyroid function studies: No results for input(s): TSH, T4TOTAL, T3FREE, THYROIDAB in  the last 72 hours.  Invalid input(s): FREET3 Anemia work up: No results for input(s): VITAMINB12, FOLATE, FERRITIN, TIBC, IRON, RETICCTPCT in the last 72 hours. Sepsis Labs: Recent Labs  Lab 01/09/20 1445 01/10/20 0439 01/14/20 0437  WBC 17.6* 9.8 8.9    Microbiology Recent Results (from the past 240 hour(s))  Resp Panel by RT-PCR (Flu A&B, Covid) Nasopharyngeal Swab     Status: None   Collection Time: 01/09/20  9:14 PM   Specimen: Nasopharyngeal Swab; Nasopharyngeal(NP) swabs in vial transport medium  Result Value Ref Range Status   SARS Coronavirus 2 by RT PCR NEGATIVE NEGATIVE Final    Comment: (NOTE) SARS-CoV-2 target nucleic acids are NOT DETECTED.  The SARS-CoV-2 RNA is generally detectable in upper respiratory specimens during the acute phase of infection. The lowest concentration of SARS-CoV-2 viral copies this assay can detect is 138 copies/mL. A negative result does not preclude SARS-Cov-2 infection and should not be used as the sole basis for treatment or other patient management decisions. A negative result may occur with  improper specimen collection/handling, submission of specimen other than nasopharyngeal swab, presence of viral mutation(s) within the areas targeted by this assay, and inadequate number of viral copies(<138 copies/mL). A negative result must be combined with clinical observations, patient history, and epidemiological information. The expected result is Negative.  Fact Sheet for Patients:  EntrepreneurPulse.com.au  Fact Sheet for Healthcare Providers:  IncredibleEmployment.be  This test is no t yet approved or cleared by the [place] FDA and  has been authorized for detection and/or diagnosis of  SARS-CoV-2 by FDA under an Emergency Use Authorization (EUA). This EUA will remain  in effect (meaning this test can be used) for the duration of the COVID-19 declaration under Section 564(b)(1) of the Act, 21 U.S.C.section 360bbb-3(b)(1), unless the authorization is terminated  or revoked sooner.       Influenza A by PCR NEGATIVE NEGATIVE Final   Influenza B by PCR NEGATIVE NEGATIVE Final    Comment: (NOTE) The Xpert Xpress SARS-CoV-2/FLU/RSV plus assay is intended as an aid in the diagnosis of influenza from Nasopharyngeal swab specimens and should not be used as a sole basis for treatment. Nasal washings and aspirates are unacceptable for Xpert Xpress SARS-CoV-2/FLU/RSV testing.  Fact Sheet for Patients: EntrepreneurPulse.com.au  Fact Sheet for Healthcare Providers: IncredibleEmployment.be  This test is not yet approved or cleared by the [place] FDA and has been authorized for detection and/or diagnosis of SARS-CoV-2 by FDA under an Emergency Use Authorization (EUA). This EUA will remain in effect (meaning this test can be used) for the duration of the COVID-19 declaration under Section 564(b)(1) of the Act, 21 U.S.C. section 360bbb-3(b)(1), unless the authorization is terminated or revoked.  Performed at [facility], [address] [place], [place] [address]     Procedures and diagnostic studies:  DG Wrist 2 Views Left  Result Date: 01/13/2020 CLINICAL DATA:  ORIF distal LEFT radial fracture EXAM: LEFT WRIST - 2 VIEW COMPARISON:  01/09/2020 FINDINGS: Osseous demineralization. Improved alignment of distal LEFT radial metaphyseal fracture post ORIF. Volar plate and screws identified. Regional soft tissue swelling. No additional fracture, dislocation, or bone destruction. Distal radioulnar joint degenerative changes are seen as well as degenerative changes at the radial border of the carpus. IMPRESSION: Post ORIF of distal  LEFT radial metaphyseal fracture. Electronically Signed   By: Lavonia Dana M.D.   On: 01/13/2020 16:29   DG MINI C-ARM IMAGE ONLY  Result Date: 01/13/2020 There is no interpretation  for this exam.  This order is for images obtained during a surgical procedure.  Please See "Surgeries" Tab for more information regarding the procedure.               LOS: 4 days   Gaynor Ferreras  [facility]   Pager on [email or web address]. If 7PM-7AM, please contact night-coverage at [email or web address]     01/14/2020, 3:27 PM

## 2020-01-15 DIAGNOSIS — W19XXXD Unspecified fall, subsequent encounter: Secondary | ICD-10-CM | POA: Diagnosis not present

## 2020-01-15 DIAGNOSIS — S42352D Displaced comminuted fracture of shaft of humerus, left arm, subsequent encounter for fracture with routine healing: Secondary | ICD-10-CM | POA: Diagnosis not present

## 2020-01-15 DIAGNOSIS — S62102D Fracture of unspecified carpal bone, left wrist, subsequent encounter for fracture with routine healing: Secondary | ICD-10-CM

## 2020-01-15 DIAGNOSIS — I480 Paroxysmal atrial fibrillation: Secondary | ICD-10-CM | POA: Diagnosis not present

## 2020-01-15 DIAGNOSIS — S065X9A Traumatic subdural hemorrhage with loss of consciousness of unspecified duration, initial encounter: Secondary | ICD-10-CM | POA: Diagnosis not present

## 2020-01-15 LAB — PROTIME-INR
INR: 1.5 — ABNORMAL HIGH (ref 0.8–1.2)
Prothrombin Time: 17.3 seconds — ABNORMAL HIGH (ref 11.4–15.2)

## 2020-01-15 MED ORDER — WARFARIN SODIUM 2 MG PO TABS
2.0000 mg | ORAL_TABLET | Freq: Once | ORAL | Status: AC
  Administered 2020-01-15: 2 mg via ORAL
  Filled 2020-01-15: qty 1

## 2020-01-15 MED ORDER — WARFARIN - PHARMACIST DOSING INPATIENT: Freq: Every day | Status: DC

## 2020-01-15 MED ORDER — BISACODYL 10 MG RE SUPP: 10.0000 mg | Freq: Once | RECTAL | Status: DC

## 2020-01-15 MED ORDER — POLYETHYLENE GLYCOL 3350 17 G PO PACK
17.0000 g | PACK | Freq: Every day | ORAL | Status: DC
  Administered 2020-01-15 – 2020-01-16 (×2): 17 g via ORAL
  Filled 2020-01-15 (×2): qty 1

## 2020-01-15 MED ORDER — ENOXAPARIN SODIUM 40 MG/0.4ML ~~LOC~~ SOLN
40.0000 mg | SUBCUTANEOUS | Status: DC
  Administered 2020-01-16: 40 mg via SUBCUTANEOUS
  Filled 2020-01-15 (×2): qty 0.4

## 2020-01-15 MED ORDER — SENNOSIDES-DOCUSATE SODIUM 8.6-50 MG PO TABS
1.0000 | ORAL_TABLET | Freq: Every day | ORAL | Status: DC
  Administered 2020-01-15 – 2020-01-16 (×2): 1 via ORAL
  Filled 2020-01-15 (×2): qty 1

## 2020-01-15 NOTE — Progress Notes (Signed)
   Subjective: 2 Days Post-Op Procedure(s) (LRB): OPEN REDUCTION INTERNAL FIXATION (ORIF) WRIST FRACTURE (Left) Patient reports pain as mild.  Pain and swelling improving within left hand. Patient is well, and has had no acute complaints or problems We will continue with physical therapy today.   Objective: Vital signs in last 24 hours: Temp:  [97.6 F (36.4 C)-98.4 F (36.9 C)] 98 F (36.7 C) (01/09 0800) Pulse Rate:  [75-90] 88 (01/09 0800) Resp:  [16-20] 18 (01/09 0800) BP: (129-161)/(72-86) 161/82 (01/09 0800) SpO2:  [99 %-100 %] 100 % (01/09 0800)  Intake/Output from previous day: 01/08 0701 - 01/09 0700 In: 51 [I.V.:0.2; IV Piggyback:50.9] Out: 700 [Urine:700] Intake/Output this shift: No intake/output data recorded.  Recent Labs    01/14/20 0437  HGB 12.7   Recent Labs    01/14/20 0437  WBC 8.9  RBC 4.17  HCT 37.8  PLT 316   Recent Labs    01/13/20 0439 01/14/20 0437  NA 138 137  K 3.9 3.6  CL 99 99  CO2 29 28  BUN 15 13  CREATININE 0.66 0.50  GLUCOSE 115* 148*  CALCIUM 8.8* 9.5   Recent Labs    01/14/20 0437 01/15/20 0404  INR 1.6* 1.5*    EXAM General - Patient is Alert, Appropriate and Oriented Left upper Extremity - Neurovascular intact Sensation intact distally Intact pulses distally No cellulitis present Compartment soft volar splint intact with out drainage. able to move fingers and make a fist Dressing - dressing C/D/I  Past Medical History:  Diagnosis Date  . Asthma   . Breast cancer ([place]) 2010   RIGHT  . Cancer (HCC)    basel cell, s/p MOH'S/DR. DASHER  . GERD (gastroesophageal reflux disease) 06/12/11   EGD NORMAL  . Hx of mammogram 2014   NORMAL  . Hyperlipidemia   . Hypertension   . Osteopenia   . PAF (paroxysmal atrial fibrillation) ([place])   . 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   Weight as of this encounter: 49.9 kg. Advance diet Up with therapy, PWB LUE with platform walker Follow up with [facility] orthopedics first of next week for dressing change, wound check Elevate LUE, work on LUE digit ROM    T. Rachelle Hora, PA-C [place] 01/15/2020, 9:26 AM

## 2020-01-15 NOTE — Progress Notes (Signed)
Physical Therapy Treatment Patient Details Name: Laura David MRN: [id number] DOB: 23-May-1934 Today's Date: 01/15/2020    History of Present Illness Pt admitted for acute subdural hemorrhage and L UE fractures following fall at grocery store. Per neuro-hemorrhage stable and cleared to work with therapy. Pt underwent ORIF of L wrist on 1/7. Cleared to PWB through L shoulder to use platform walker. History includes breast cancer, GERD, and HTN.    PT Comments    Patient received in bed willing to participate with PT today.  Patient completed supine<>sit transfers with mod A from therapist due to pain in the LUE and decreased ROM of the RUE for transferring. Seated EOB patient required minA to maintain seated balance safely.  Completed sit<>stand transfer x2 with mod A for safety, hand placement and attempted to complete gait, but patient with verbalized too much pain when attempting to put her LUE in the platform. Pt tends to list posteriorly with standing, requiring multiple VCs to maintain a more upright positioning.  Pt unable to maintain changes in standing posture.  Pt returned to bed requiring mod A for bed mobility and positioning. Pt left with bed alarm set, call bell within reach and son in the room.  Conversed with nurse about potential medication for pt's LUE pain.       Follow Up Recommendations  SNF     Equipment Recommendations       Recommendations for Other Services       Precautions / Restrictions Precautions Precautions: Fall;Other (comment) (PWB LUE) Precaution Comments: PWB LUE Required Braces or Orthoses: Sling;Splint/Cast Restrictions Weight Bearing Restrictions: Yes LUE Weight Bearing: Partial weight bearing LUE Partial Weight Bearing Percentage or Pounds: Use platform RW Other Position/Activity Restrictions: L humerus fx and L radius s/p ORIF    Mobility  Bed Mobility Overal bed mobility: Needs Assistance Bed Mobility: Supine to Sit;Sit to Supine Rolling:  Mod assist   Supine to sit: Mod assist Sit to supine: Mod assist   General bed mobility comments: Mod A for sit<>supine transfer for LE management, seated stability. VCs provided to patient to complete bridging and bed mobility.  Transfers Overall transfer level: Needs assistance Equipment used: Left platform walker Transfers: Sit to/from Stand Sit to Stand: Mod assist         General transfer comment: Mod A to complete sit<>stand transfer, pt tends to lean posteriorly when standing and required multiple cues to stand more erect, pt with severe pain and not able to maintain position in the platform walker appropriately to completes steps today            Balance Overall balance assessment: Needs assistance;History of Falls Sitting-balance support: Feet supported;Single extremity supported Sitting balance-Leahy Scale: Good Sitting balance - Comments: Unsteady with standing balance today requiring ModA to maintain stability, she required multiple cues to maintain upright position without LOB Postural control: Posterior lean Standing balance support: Single extremity supported Standing balance-Leahy Scale: Poor Standing balance comment: Mod A to maintain standing position safely                            Cognition Arousal/Alertness: Awake/alert Behavior During Therapy: WFL for tasks assessed/performed Overall Cognitive Status: Within Functional Limits for tasks assessed Area of Impairment: Following commands                       Following Commands: Follows one step commands consistently Safety/Judgement: Decreased awareness of deficits  Exercises      General Comments        Pertinent Vitals/Pain Pain Assessment: Faces Faces Pain Scale: Hurts whole lot Pain Location: L shoulder and back Pain Descriptors / Indicators: Aching;Constant;Discomfort;Guarding;Grimacing Pain Intervention(s): Limited activity within patient's  tolerance;Monitored during session;Utilized relaxation techniques;Patient requesting pain meds-RN notified           PT Goals (current goals can now be found in the care plan section) Acute Rehab PT Goals Patient Stated Goal: To go home PT Goal Formulation: With patient Time For Goal Achievement: 01/24/20 Potential to Achieve Goals: Fair Progress towards PT goals: Progressing toward goals    Frequency    7X/week      PT Plan Current plan remains appropriate    Co-evaluation              AM-PAC PT "6 Clicks" Mobility   Outcome Measure  Help needed turning from your back to your side while in a flat bed without using bedrails?: A Lot Help needed moving from lying on your back to sitting on the side of a flat bed without using bedrails?: A Lot Help needed moving to and from a bed to a chair (including a wheelchair)?: A Lot Help needed standing up from a chair using your arms (e.g., wheelchair or bedside chair)?: A Little Help needed to walk in hospital room?: A Lot Help needed climbing 3-5 steps with a railing? : Total 6 Click Score: 12    End of Session Equipment Utilized During Treatment: Gait belt Activity Tolerance: Patient limited by pain Patient left: in bed;with bed alarm set;with family/visitor present;with call bell/phone within reach Nurse Communication: Mobility status PT Visit Diagnosis: Muscle weakness (generalized) (M62.81);History of falling (Z91.81);Difficulty in walking, not elsewhere classified (R26.2);Pain Pain - Right/Left: Left Pain - part of body: Arm     Time: 5465-0354 PT Time Calculation (min) (ACUTE ONLY): 40 min  Charges:  $Therapeutic Activity: 23-37 mins                     Duanne Guess, PT, DPT 01/15/20, 1:09 PM    Laura David 01/15/2020, 1:04 PM

## 2020-01-15 NOTE — Progress Notes (Addendum)
Progress Note    Laura David  [id number] DOB: 1934/10/20  DOA: 01/09/2020 PCP: [name], MD      Brief Narrative:    Medical records reviewed and are as summarized below:  Laura David is a 86 y.o. female with medical history including but not limited to breast cancer, GERD, hyperlipidemia, hypertension, paroxysmal atrial fibrillation on chronic warfarin therapy, basal cell carcinoma, who came to the hospital after sustaining a mechanical fall at home.      Assessment/Plan:   Principal Problem:   Fall Active Problems:   Hypertension   Hyperlipidemia   PAF (paroxysmal atrial fibrillation) (HCC)   Asthma   GERD (gastroesophageal reflux disease)   Breast cancer (HCC)   Coagulopathy (HCC)   Subdural hematoma (HCC)   Leucocytosis   Hypokalemia   Closed comminuted left humeral fracture   Closed fracture of left wrist    Body mass index is 21.48 kg/m.     S/p mechanical fall leading to mildly comminuted fracture of left proximal left humerus, comminuted, intra-articular fracture of left distal radius, acute mildly depressed fracture of left anterior maxillary sinus wall: s/p open reduction internal fixation of left wrist fracture.  Continue analgesics for pain.  Subdural hemorrhage: Repeat CT head x2 bleeding showed that subdural bleed is stable.  Coumadin has been held.  Neurosurgeon has evaluated the patient and recommended holding Coumadin for a week.  Coumadin can be resumed based on neurosurgeon's previous recommendation.    Paroxysmal atrial fibrillation: Resume Coumadin.    Hypokalemia, hypomagnesemia and hypophosphatemia: Improved. Replete phosphorus.  Hypertension: Continue antihypertensives  Constipation: Laxatives have been prescribed.  Leukocytosis: Improved.   Diet Order            Diet regular Room service appropriate? Yes; Fluid consistency: Thin  Diet effective now                    Consultants:  Orthopedic  surgeon  Procedures:  Open reduction internal fixation left wrist fracture on 01/13/2020    Medications:   . amLODipine  5 mg Oral Daily  . atorvastatin  10 mg Oral Daily  . bisacodyl  10 mg Rectal Once  . enoxaparin (LOVENOX) injection  40 mg Subcutaneous Q24H  . feeding supplement  237 mL Oral BID BM  . lidocaine  1 patch Transdermal Q24H  . metoprolol tartrate  25 mg Oral BID  . multivitamin with minerals  1 tablet Oral Daily  . phosphorus  500 mg Oral TID  . polyethylene glycol  17 g Oral Daily  . senna-docusate  1 tablet Oral QHS   Continuous Infusions: . sodium chloride Stopped (01/14/20 1310)     Anti-infectives (From admission, onward)   Start     Dose/Rate Route Frequency Ordered Stop   01/13/20 2300  ceFAZolin (ANCEF) IVPB 1 g/50 mL premix        1 g 100 mL/hr over 30 Minutes Intravenous Every 8 hours 01/13/20 1710 01/14/20 1340   01/13/20 1500  ceFAZolin (ANCEF) IVPB 1 g/50 mL premix        1 g 100 mL/hr over 30 Minutes Intravenous  Once 01/13/20 0753 01/13/20 1516             Family Communication/Anticipated D/C date and plan/Code Status   DVT prophylaxis: enoxaparin (LOVENOX) injection 40 mg Start: 01/15/20 1500 SCDs Start: 01/09/20 2120     Code Status: DNR  Family Communication: None Disposition Plan:    Status is:  Inpatient  Remains inpatient appropriate because:Unsafe d/c plan and Inpatient level of care appropriate due to severity of illness   Dispo: The patient is from: Home              Anticipated d/c is to: SNF              Anticipated d/c date is: 1 day              Patient currently is not medically stable to d/c.           Subjective:   Interval events noted.  She complains of pain in the left wrist.  She has not a bowel movement for several days.  Objective:    Vitals:   01/14/20 2030 01/15/20 0422 01/15/20 0800 01/15/20 1325  BP: 131/80 140/86 (!) 161/82 112/69  Pulse: 86 84 88 83  Resp: 16 20 18 18    Temp: 98.4 F (36.9 C) 98 F (36.7 C) 98 F (36.7 C) 97.7 F (36.5 C)  TempSrc: Oral Oral  Oral  SpO2: 100% 100% 100% 100%  Weight:      Height:       No data found.   Intake/Output Summary (Last 24 hours) at 01/15/2020 1419 Last data filed at 01/15/2020 1028 Gross per 24 hour  Intake 240 ml  Output 700 ml  Net -460 ml   Filed Weights   01/09/20 1233 01/13/20 1402  Weight: 45.4 kg 49.9 kg    Exam:   GEN: NAD SKIN: Warm and dry EYES: Left periorbital bruising ENT: MMM CV: RRR PULM: CTA B ABD: soft, ND, NT, +BS CNS: AAO x 3, non focal EXT: Dressing on left wrist and forearm is clean, dry and intact.     Data Reviewed:   I have personally reviewed following labs and imaging studies:  Labs: Labs show the following:   Basic Metabolic Panel: Recent Labs  Lab 01/09/20 1445 01/10/20 0439 01/12/20 0612 01/13/20 0439 01/14/20 0437  NA 140 137 133* 138 137  K 3.1* 3.4* 3.2* 3.9 3.6  CL 101 101 95* 99 99  CO2 23 27 28 29 28   GLUCOSE 139* 92 97 115* 148*  BUN 17 12 13 15 13   CREATININE 0.83 0.66 0.73 0.66 0.50  CALCIUM 9.8 8.6* 9.0 8.8* 9.5  MG  --   --  1.6* 1.9 1.8  PHOS  --   --  1.6* 3.2 1.8*   GFR Estimated Creatinine Clearance: 38.3 mL/min (by C-G formula based on SCr of 0.5 mg/dL). Liver Function Tests: Recent Labs  Lab 01/09/20 1445 01/10/20 0439  AST 28 21  ALT 14 12  ALKPHOS 64 51  BILITOT 1.1 1.5*  PROT 7.9 6.6  ALBUMIN 4.4 3.7   No results for input(s): LIPASE, AMYLASE in the last 168 hours. No results for input(s): AMMONIA in the last 168 hours. Coagulation profile Recent Labs  Lab 01/11/20 0325 01/12/20 0612 01/13/20 0439 01/14/20 0437 01/15/20 0404  INR 3.0* 2.8* 1.8* 1.6* 1.5*    CBC: Recent Labs  Lab 01/09/20 1445 01/10/20 0439 01/14/20 0437  WBC 17.6* 9.8 8.9  NEUTROABS 15.5*  --  7.3  HGB 13.8 11.2* 12.7  HCT 41.5 33.8* 37.8  MCV 90.6 91.6 90.6  PLT 251 198 316   Cardiac Enzymes: No results for input(s):  CKTOTAL, CKMB, CKMBINDEX, TROPONINI in the last 168 hours. BNP (last 3 results) No results for input(s): PROBNP in the last 8760 hours. CBG: No results for input(s): GLUCAP in  the last 168 hours. D-Dimer: No results for input(s): DDIMER in the last 72 hours. Hgb A1c: No results for input(s): HGBA1C in the last 72 hours. Lipid Profile: No results for input(s): CHOL, HDL, LDLCALC, TRIG, CHOLHDL, LDLDIRECT in the last 72 hours. Thyroid function studies: No results for input(s): TSH, T4TOTAL, T3FREE, THYROIDAB in the last 72 hours.  Invalid input(s): FREET3 Anemia work up: No results for input(s): VITAMINB12, FOLATE, FERRITIN, TIBC, IRON, RETICCTPCT in the last 72 hours. Sepsis Labs: Recent Labs  Lab 01/09/20 1445 01/10/20 0439 01/14/20 0437  WBC 17.6* 9.8 8.9    Microbiology Recent Results (from the past 240 hour(s))  Resp Panel by RT-PCR (Flu A&B, Covid) Nasopharyngeal Swab     Status: None   Collection Time: 01/09/20  9:14 PM   Specimen: Nasopharyngeal Swab; Nasopharyngeal(NP) swabs in vial transport medium  Result Value Ref Range Status   SARS Coronavirus 2 by RT PCR NEGATIVE NEGATIVE Final    Comment: (NOTE) SARS-CoV-2 target nucleic acids are NOT DETECTED.  The SARS-CoV-2 RNA is generally detectable in upper respiratory specimens during the acute phase of infection. The lowest concentration of SARS-CoV-2 viral copies this assay can detect is 138 copies/mL. A negative result does not preclude SARS-Cov-2 infection and should not be used as the sole basis for treatment or other patient management decisions. A negative result may occur with  improper specimen collection/handling, submission of specimen other than nasopharyngeal swab, presence of viral mutation(s) within the areas targeted by this assay, and inadequate number of viral copies(<138 copies/mL). A negative result must be combined with clinical observations, patient history, and epidemiological information. The  expected result is Negative.  Fact Sheet for Patients:  EntrepreneurPulse.com.au  Fact Sheet for Healthcare Providers:  IncredibleEmployment.be  This test is no t yet approved or cleared by the [place] FDA and  has been authorized for detection and/or diagnosis of SARS-CoV-2 by FDA under an Emergency Use Authorization (EUA). This EUA will remain  in effect (meaning this test can be used) for the duration of the COVID-19 declaration under Section 564(b)(1) of the Act, 21 U.S.C.section 360bbb-3(b)(1), unless the authorization is terminated  or revoked sooner.       Influenza A by PCR NEGATIVE NEGATIVE Final   Influenza B by PCR NEGATIVE NEGATIVE Final    Comment: (NOTE) The Xpert Xpress SARS-CoV-2/FLU/RSV plus assay is intended as an aid in the diagnosis of influenza from Nasopharyngeal swab specimens and should not be used as a sole basis for treatment. Nasal washings and aspirates are unacceptable for Xpert Xpress SARS-CoV-2/FLU/RSV testing.  Fact Sheet for Patients: EntrepreneurPulse.com.au  Fact Sheet for Healthcare Providers: IncredibleEmployment.be  This test is not yet approved or cleared by the [place] FDA and has been authorized for detection and/or diagnosis of SARS-CoV-2 by FDA under an Emergency Use Authorization (EUA). This EUA will remain in effect (meaning this test can be used) for the duration of the COVID-19 declaration under Section 564(b)(1) of the Act, 21 U.S.C. section 360bbb-3(b)(1), unless the authorization is terminated or revoked.  Performed at [facility], [address] [place], [place] [address]     Procedures and diagnostic studies:  DG Wrist 2 Views Left  Result Date: 01/13/2020 CLINICAL DATA:  ORIF distal LEFT radial fracture EXAM: LEFT WRIST - 2 VIEW COMPARISON:  01/09/2020 FINDINGS: Osseous demineralization. Improved alignment of distal LEFT  radial metaphyseal fracture post ORIF. Volar plate and screws identified. Regional soft tissue swelling. No additional fracture, dislocation, or bone destruction. Distal  radioulnar joint degenerative changes are seen as well as degenerative changes at the radial border of the carpus. IMPRESSION: Post ORIF of distal LEFT radial metaphyseal fracture. Electronically Signed   By: Lavonia Dana M.D.   On: 01/13/2020 16:29   DG MINI C-ARM IMAGE ONLY  Result Date: 01/13/2020 There is no interpretation for this exam.  This order is for images obtained during a surgical procedure.  Please See "Surgeries" Tab for more information regarding the procedure.               LOS: 5 days   Corin Tilly  [facility]   Pager on [email or web address]. If 7PM-7AM, please contact night-coverage at [email or web address]     01/15/2020, 2:19 PM

## 2020-01-15 NOTE — Consult Note (Signed)
ANTICOAGULATION CONSULT NOTE - Initial Consult  Pharmacy Consult for Warfarin Indication: atrial fibrillation  Allergies  Allergen Reactions  . Flagyl [Metronidazole] Nausea Only  . Pacerone [Amiodarone]     Eye deposits  . Ramipril Cough    Patient Measurements: Height: 5' (152.4 cm) Weight: 49.9 kg (110 lb) IBW/kg (Calculated) : 45.5   Vital Signs: Temp: 97.7 F (36.5 C) (01/09 1325) Temp Source: Oral (01/09 1325) BP: 112/69 (01/09 1325) Pulse Rate: 83 (01/09 1325)  Labs: Recent Labs    01/13/20 0439 01/14/20 0437 01/15/20 0404  HGB  --  12.7  --   HCT  --  37.8  --   PLT  --  316  --   LABPROT 19.9* 18.7* 17.3*  INR 1.8* 1.6* 1.5*  CREATININE 0.66 0.50  --     Estimated Creatinine Clearance: 38.3 mL/min (by C-G formula based on SCr of 0.5 mg/dL).   Medical History: Past Medical History:  Diagnosis Date  . Asthma   . Breast cancer ([facility]) 2010   RIGHT  . Cancer (HCC)    basel cell, s/p MOH'S/DR. DASHER  . GERD (gastroesophageal reflux disease) 06/12/11   EGD NORMAL  . Hx of mammogram 2014   NORMAL  . Hyperlipidemia   . Hypertension   . Osteopenia   . PAF (paroxysmal atrial fibrillation) ([place])   . 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 female with medical history of paroxysmal atrial fibrillation on chronic warfarin therapy. Presented to hospital after mechanical fall of home. S/p ORIF wrist fracture on 01/13/19. Pharmacy has been consulted to dose warfarin while inpatient. Current home regimen confirmed with patient is 2 mg M-W-F, and 1 mg Tu-Thus-Sat- Sun (Total weekly dose = 10 mg) INR therapeutic on admission at 3.0  Date INR Warfarin Dose 1/5 3.0 Held (admitted) 1/6 2.8 Held  1/7 1.8 Held 1/8 1.6 Held 1/9 1.5 2 mg  Goal of Therapy:  INR 2-3 Monitor platelets by anticoagulation protocol: Yes   Plan:   Since home dose held last few days will give  patient 2 mg dose of warfarin tonight   Will check INR tomorrow morning  CBC daily   Dorothe Pea, PharmD, BCPS Clinical Pharmacist ' 01/15/2020,2:24 PM

## 2020-01-16 ENCOUNTER — Encounter: Payer: Self-pay | Admitting: Orthopedic Surgery

## 2020-01-16 DIAGNOSIS — S065X9A Traumatic subdural hemorrhage with loss of consciousness of unspecified duration, initial encounter: Secondary | ICD-10-CM | POA: Diagnosis not present

## 2020-01-16 DIAGNOSIS — S62102D Fracture of unspecified carpal bone, left wrist, subsequent encounter for fracture with routine healing: Secondary | ICD-10-CM | POA: Diagnosis not present

## 2020-01-16 DIAGNOSIS — S42352D Displaced comminuted fracture of shaft of humerus, left arm, subsequent encounter for fracture with routine healing: Secondary | ICD-10-CM | POA: Diagnosis not present

## 2020-01-16 DIAGNOSIS — W19XXXD Unspecified fall, subsequent encounter: Secondary | ICD-10-CM | POA: Diagnosis not present

## 2020-01-16 LAB — CBC
HCT: 36.2 % (ref 36.0–46.0)
Hemoglobin: 12.4 g/dL (ref 12.0–15.0)
MCH: 30.6 pg (ref 26.0–34.0)
MCHC: 34.3 g/dL (ref 30.0–36.0)
MCV: 89.4 fL (ref 80.0–100.0)
Platelets: 346 10*3/uL (ref 150–400)
RBC: 4.05 MIL/uL (ref 3.87–5.11)
RDW: 12.7 % (ref 11.5–15.5)
WBC: 7.3 10*3/uL (ref 4.0–10.5)
nRBC: 0 % (ref 0.0–0.2)

## 2020-01-16 LAB — RESP PANEL BY RT-PCR (FLU A&B, COVID) ARPGX2
Influenza A by PCR: NEGATIVE
Influenza B by PCR: NEGATIVE
SARS Coronavirus 2 by RT PCR: NEGATIVE

## 2020-01-16 LAB — PROTIME-INR
INR: 1.3 — ABNORMAL HIGH (ref 0.8–1.2)
Prothrombin Time: 15.2 seconds (ref 11.4–15.2)

## 2020-01-16 MED ORDER — WARFARIN SODIUM 3 MG PO TABS
3.0000 mg | ORAL_TABLET | Freq: Once | ORAL | Status: AC
  Administered 2020-01-16: 3 mg via ORAL
  Filled 2020-01-16: qty 1

## 2020-01-16 MED ORDER — K PHOS MONO-SOD PHOS DI & MONO 155-852-130 MG PO TABS
500.0000 mg | ORAL_TABLET | Freq: Three times a day (TID) | ORAL | Status: AC
  Administered 2020-01-16 – 2020-01-17 (×3): 500 mg via ORAL
  Filled 2020-01-16 (×3): qty 2

## 2020-01-16 NOTE — TOC Progression Note (Signed)
Transition of Care [facility]) - Progression Note    Patient Details  Name: Laura David MRN: [id number] Date of Birth: 02-14-1934  [facility] CM/SW Contact  Beverly Sessions, RN Phone Number: 01/16/2020, 4:26 PM  Clinical Narrative:     Bed offers presented to patient and son Accepted offer at [facility] Repeat covid test orders Insurance auth obtained through portal  Per [facility] they will have a bed tomorrow.  MD notified  Expected Discharge Plan: [place]    Expected Discharge Plan and Services Expected Discharge Plan: [place]                                               Social Determinants of Health (SDOH) Interventions    Readmission Risk Interventions Readmission Risk Prevention Plan 01/13/2020  Transportation Screening Complete  Home Care Screening (No Data)  Medication Review (RN CM) Complete  Some recent data might be hidden

## 2020-01-16 NOTE — Progress Notes (Signed)
Progress Note    Laura David  [id number] DOB: 01-10-1934  DOA: 01/09/2020 PCP: [name], MD      Brief Narrative:    Medical records reviewed and are as summarized below:  Laura David is a 86 y.o. female with medical history including but not limited to breast cancer, GERD, hyperlipidemia, hypertension, paroxysmal atrial fibrillation on chronic warfarin therapy, basal cell carcinoma, who came to the hospital after sustaining a mechanical fall at home.  Work-up revealed subdural hemorrhage, mildly comminuted fracture of the left proximal humerus, comminuted left distal radius fracture and mildly depressed fracture of the left anterior maxillary sinus wall.  She was on Coumadin for atrial fibrillation and this was held because of subdural hemorrhage.  She was seen in consultation by neurosurgeon who recommended conservative management and holding Coumadin for 1 week.  She was also seen by the orthopedic surgeon, Dr. Rudene Christians.  She underwent open reduction internal fixation of left wrist fracture on 01/13/2020.   She had hypokalemia, hypomagnesemia and hypophosphatemia that were treated.  She was evaluated by PT and OT who recommended further rehabilitation at the skilled nursing facility.    Assessment/Plan:   Principal Problem:   Fall Active Problems:   Hypertension   Hyperlipidemia   PAF (paroxysmal atrial fibrillation) (HCC)   Asthma   GERD (gastroesophageal reflux disease)   Breast cancer (HCC)   Coagulopathy (HCC)   Subdural hematoma (HCC)   Leucocytosis   Hypokalemia   Closed comminuted left humeral fracture   Closed fracture of left wrist    Body mass index is 21.48 kg/m.     S/p mechanical fall leading to mildly comminuted fracture of left proximal left humerus, comminuted, intra-articular fracture of left distal radius, acute mildly depressed fracture of left anterior maxillary sinus wall: s/p open reduction internal fixation of left wrist  fracture.  Continue analgesics for pain.  Subdural hemorrhage: Repeat CT head x2 bleeding showed that subdural bleed is stable.  Neurosurgeon has evaluated the patient and recommended holding Coumadin for a week.  Coumadin has been resumed based on neurosurgeon's previous recommendation.    Paroxysmal atrial fibrillation: Continue Coumadin.  There is no indication for full dose heparin/Lovenox bridge.    Hypokalemia, hypomagnesemia and hypophosphatemia: Improved. Replete phosphorus.  Hypertension: Continue antihypertensives  Constipation: Laxatives as needed  Leukocytosis: Improved.  Plan of care was discussed with her son, Lennette Bihari, at the bedside.   Diet Order            Diet regular Room service appropriate? Yes; Fluid consistency: Thin  Diet effective now                    Consultants:  Orthopedic surgeon  Procedures:  Open reduction internal fixation left wrist fracture on 01/13/2020    Medications:   . amLODipine  5 mg Oral Daily  . atorvastatin  10 mg Oral Daily  . bisacodyl  10 mg Rectal Once  . enoxaparin (LOVENOX) injection  40 mg Subcutaneous Q24H  . feeding supplement  237 mL Oral BID BM  . lidocaine  1 patch Transdermal Q24H  . metoprolol tartrate  25 mg Oral BID  . multivitamin with minerals  1 tablet Oral Daily  . polyethylene glycol  17 g Oral Daily  . senna-docusate  1 tablet Oral QHS  . warfarin  3 mg Oral ONCE-1600  . Warfarin - Pharmacist Dosing Inpatient   Does not apply q1600   Continuous Infusions: . sodium chloride  Stopped (01/14/20 1310)     Anti-infectives (From admission, onward)   Start     Dose/Rate Route Frequency Ordered Stop   01/13/20 2300  ceFAZolin (ANCEF) IVPB 1 g/50 mL premix        1 g 100 mL/hr over 30 Minutes Intravenous Every 8 hours 01/13/20 1710 01/14/20 1340   01/13/20 1500  ceFAZolin (ANCEF) IVPB 1 g/50 mL premix        1 g 100 mL/hr over 30 Minutes Intravenous  Once 01/13/20 0753 01/13/20 1516              Family Communication/Anticipated D/C date and plan/Code Status   DVT prophylaxis: enoxaparin (LOVENOX) injection 40 mg Start: 01/15/20 1500 SCDs Start: 01/09/20 2120     Code Status: DNR  Family Communication: None Disposition Plan:    Status is: Inpatient  Remains inpatient appropriate because:Unsafe d/c plan and Inpatient level of care appropriate due to severity of illness   Dispo: The patient is from: Home              Anticipated d/c is to: SNF              Anticipated d/c date is: 1 day              Patient currently is not medically stable to d/c.           Subjective:   She complains of pain in the left wrist.  Objective:    Vitals:   01/15/20 1953 01/16/20 0429 01/16/20 0841 01/16/20 1141  BP: 124/69 134/70 (!) 147/73 131/83  Pulse: 84 71 90 93  Resp: 20 16 18 18   Temp: 97.7 F (36.5 C) 97.7 F (36.5 C) 97.9 F (36.6 C) 98.1 F (36.7 C)  TempSrc: Oral Oral Oral Oral  SpO2: 100% 99% 98% 100%  Weight:      Height:       No data found.   Intake/Output Summary (Last 24 hours) at 01/16/2020 1513 Last data filed at 01/16/2020 1300 Gross per 24 hour  Intake 480 ml  Output 700 ml  Net -220 ml   Filed Weights   01/09/20 1233 01/13/20 1402  Weight: 45.4 kg 49.9 kg    Exam:   GEN: NAD SKIN: Bruising on the left and left lateral thigh. EYES: Left periorbital bruising.  Left subconjunctival hemorrhage. ENT: MMM CV: RRR PULM: CTA B ABD: soft, ND, NT, +BS CNS: AAO x 3, non focal EXT: Dressing on the left wrist and forearm is clean, dry and intact.      Data Reviewed:   I have personally reviewed following labs and imaging studies:  Labs: Labs show the following:   Basic Metabolic Panel: Recent Labs  Lab 01/10/20 0439 01/12/20 0612 01/13/20 0439 01/14/20 0437  NA 137 133* 138 137  K 3.4* 3.2* 3.9 3.6  CL 101 95* 99 99  CO2 27 28 29 28   GLUCOSE 92 97 115* 148*  BUN 12 13 15 13   CREATININE 0.66 0.73 0.66  0.50  CALCIUM 8.6* 9.0 8.8* 9.5  MG  --  1.6* 1.9 1.8  PHOS  --  1.6* 3.2 1.8*   GFR Estimated Creatinine Clearance: 38.3 mL/min (by C-G formula based on SCr of 0.5 mg/dL). Liver Function Tests: Recent Labs  Lab 01/10/20 0439  AST 21  ALT 12  ALKPHOS 51  BILITOT 1.5*  PROT 6.6  ALBUMIN 3.7   No results for input(s): LIPASE, AMYLASE in the last 168 hours. No  results for input(s): AMMONIA in the last 168 hours. Coagulation profile Recent Labs  Lab 01/12/20 0612 01/13/20 0439 01/14/20 0437 01/15/20 0404 01/16/20 0451  INR 2.8* 1.8* 1.6* 1.5* 1.3*    CBC: Recent Labs  Lab 01/10/20 0439 01/14/20 0437 01/16/20 0451  WBC 9.8 8.9 7.3  NEUTROABS  --  7.3  --   HGB 11.2* 12.7 12.4  HCT 33.8* 37.8 36.2  MCV 91.6 90.6 89.4  PLT 198 316 346   Cardiac Enzymes: No results for input(s): CKTOTAL, CKMB, CKMBINDEX, TROPONINI in the last 168 hours. BNP (last 3 results) No results for input(s): PROBNP in the last 8760 hours. CBG: No results for input(s): GLUCAP in the last 168 hours. D-Dimer: No results for input(s): DDIMER in the last 72 hours. Hgb A1c: No results for input(s): HGBA1C in the last 72 hours. Lipid Profile: No results for input(s): CHOL, HDL, LDLCALC, TRIG, CHOLHDL, LDLDIRECT in the last 72 hours. Thyroid function studies: No results for input(s): TSH, T4TOTAL, T3FREE, THYROIDAB in the last 72 hours.  Invalid input(s): FREET3 Anemia work up: No results for input(s): VITAMINB12, FOLATE, FERRITIN, TIBC, IRON, RETICCTPCT in the last 72 hours. Sepsis Labs: Recent Labs  Lab 01/10/20 0439 01/14/20 0437 01/16/20 0451  WBC 9.8 8.9 7.3    Microbiology Recent Results (from the past 240 hour(s))  Resp Panel by RT-PCR (Flu A&B, Covid) Nasopharyngeal Swab     Status: None   Collection Time: 01/09/20  9:14 PM   Specimen: Nasopharyngeal Swab; Nasopharyngeal(NP) swabs in vial transport medium  Result Value Ref Range Status   SARS Coronavirus 2 by RT PCR NEGATIVE  NEGATIVE Final    Comment: (NOTE) SARS-CoV-2 target nucleic acids are NOT DETECTED.  The SARS-CoV-2 RNA is generally detectable in upper respiratory specimens during the acute phase of infection. The lowest concentration of SARS-CoV-2 viral copies this assay can detect is 138 copies/mL. A negative result does not preclude SARS-Cov-2 infection and should not be used as the sole basis for treatment or other patient management decisions. A negative result may occur with  improper specimen collection/handling, submission of specimen other than nasopharyngeal swab, presence of viral mutation(s) within the areas targeted by this assay, and inadequate number of viral copies(<138 copies/mL). A negative result must be combined with clinical observations, patient history, and epidemiological information. The expected result is Negative.  Fact Sheet for Patients:  EntrepreneurPulse.com.au  Fact Sheet for Healthcare Providers:  IncredibleEmployment.be  This test is no t yet approved or cleared by the [place] FDA and  has been authorized for detection and/or diagnosis of SARS-CoV-2 by FDA under an Emergency Use Authorization (EUA). This EUA will remain  in effect (meaning this test can be used) for the duration of the COVID-19 declaration under Section 564(b)(1) of the Act, 21 U.S.C.section 360bbb-3(b)(1), unless the authorization is terminated  or revoked sooner.       Influenza A by PCR NEGATIVE NEGATIVE Final   Influenza B by PCR NEGATIVE NEGATIVE Final    Comment: (NOTE) The Xpert Xpress SARS-CoV-2/FLU/RSV plus assay is intended as an aid in the diagnosis of influenza from Nasopharyngeal swab specimens and should not be used as a sole basis for treatment. Nasal washings and aspirates are unacceptable for Xpert Xpress SARS-CoV-2/FLU/RSV testing.  Fact Sheet for Patients: EntrepreneurPulse.com.au  Fact Sheet for Healthcare  Providers: IncredibleEmployment.be  This test is not yet approved or cleared by the [place] FDA and has been authorized for detection and/or diagnosis of SARS-CoV-2 by FDA under an  Emergency Use Authorization (EUA). This EUA will remain in effect (meaning this test can be used) for the duration of the COVID-19 declaration under Section 564(b)(1) of the Act, 21 U.S.C. section 360bbb-3(b)(1), unless the authorization is terminated or revoked.  Performed at [facility], [address] [place], [place] [address]   Resp Panel by RT-PCR (Flu A&B, Covid) Nasopharyngeal Swab     Status: None   Collection Time: 01/16/20  1:05 PM   Specimen: Nasopharyngeal Swab; Nasopharyngeal(NP) swabs in vial transport medium  Result Value Ref Range Status   SARS Coronavirus 2 by RT PCR NEGATIVE NEGATIVE Final    Comment: (NOTE) SARS-CoV-2 target nucleic acids are NOT DETECTED.  The SARS-CoV-2 RNA is generally detectable in upper respiratory specimens during the acute phase of infection. The lowest concentration of SARS-CoV-2 viral copies this assay can detect is 138 copies/mL. A negative result does not preclude SARS-Cov-2 infection and should not be used as the sole basis for treatment or other patient management decisions. A negative result may occur with  improper specimen collection/handling, submission of specimen other than nasopharyngeal swab, presence of viral mutation(s) within the areas targeted by this assay, and inadequate number of viral copies(<138 copies/mL). A negative result must be combined with clinical observations, patient history, and epidemiological information. The expected result is Negative.  Fact Sheet for Patients:  EntrepreneurPulse.com.au  Fact Sheet for Healthcare Providers:  IncredibleEmployment.be  This test is no t yet approved or cleared by the [place] FDA and  has been authorized for  detection and/or diagnosis of SARS-CoV-2 by FDA under an Emergency Use Authorization (EUA). This EUA will remain  in effect (meaning this test can be used) for the duration of the COVID-19 declaration under Section 564(b)(1) of the Act, 21 U.S.C.section 360bbb-3(b)(1), unless the authorization is terminated  or revoked sooner.       Influenza A by PCR NEGATIVE NEGATIVE Final   Influenza B by PCR NEGATIVE NEGATIVE Final    Comment: (NOTE) The Xpert Xpress SARS-CoV-2/FLU/RSV plus assay is intended as an aid in the diagnosis of influenza from Nasopharyngeal swab specimens and should not be used as a sole basis for treatment. Nasal washings and aspirates are unacceptable for Xpert Xpress SARS-CoV-2/FLU/RSV testing.  Fact Sheet for Patients: EntrepreneurPulse.com.au  Fact Sheet for Healthcare Providers: IncredibleEmployment.be  This test is not yet approved or cleared by the [place] FDA and has been authorized for detection and/or diagnosis of SARS-CoV-2 by FDA under an Emergency Use Authorization (EUA). This EUA will remain in effect (meaning this test can be used) for the duration of the COVID-19 declaration under Section 564(b)(1) of the Act, 21 U.S.C. section 360bbb-3(b)(1), unless the authorization is terminated or revoked.  Performed at [facility], [address] [place], [place] [address]     Procedures and diagnostic studies:  No results found.             LOS: 6 days   Banjamin Stovall  [facility]   Pager on [email or web address]. If 7PM-7AM, please contact night-coverage at [email or web address]     01/16/2020, 3:13 PM

## 2020-01-16 NOTE — Care Management Important Message (Signed)
Important Message  Patient Details  Name: Laura David MRN: [id number] Date of Birth: 29-Jul-1934   Medicare Important Message Given:  Yes     [name] 01/16/2020, 12:20 PM

## 2020-01-16 NOTE — Progress Notes (Signed)
Physical Therapy Treatment Patient Details Name: Laura David MRN: [id number] DOB: 06-24-1934 Today's Date: 01/16/2020    History of Present Illness Pt admitted for acute subdural hemorrhage and L UE fractures following fall at grocery store. Per neuro-hemorrhage stable and cleared to work with therapy. Pt underwent ORIF of L wrist on 1/7. Cleared to PWB through L shoulder to use platform walker. History includes breast cancer, GERD, and HTN.    PT Comments    Patient received in bed. Reports she is doing okay. Agreeable to PT session. Patient requires min assist to raise trunk to seated position, Mod assist to scoot hips forward to edge of bed.  Min assist for sit to stand, min assist for ambulation of 12 feet. Hand held assist. Patient will continue to benefit from skilled PT while here to improve functional independence and strength.    Follow Up Recommendations  SNF;Supervision for mobility/OOB     Equipment Recommendations  Other (comment) (TBD)    Recommendations for Other Services       Precautions / Restrictions Precautions Precautions: Fall Precaution Comments: PWB LUE Required Braces or Orthoses: Sling;Splint/Cast Restrictions Weight Bearing Restrictions: Yes LUE Weight Bearing: Partial weight bearing Other Position/Activity Restrictions: L humerus fx and L radius s/p ORIF    Mobility  Bed Mobility Overal bed mobility: Needs Assistance Bed Mobility: Supine to Sit     Supine to sit: Min assist     General bed mobility comments: Min assist to raise trunk to sitting, Mod assist to scoot forward, use of pad  Transfers Overall transfer level: Needs assistance Equipment used: None Transfers: Sit to/from Stand Sit to Stand: Min assist Stand pivot transfers: Min assist       General transfer comment: Min assist for sit to stand and pivoting steps to recliner, patient then able to ambulate 10 feet with hand held assist on right. She cannot bring L UE up high  enough to get onto platform to use walker. She should do okay with hemiwalker.  Ambulation/Gait Ambulation/Gait assistance: Min assist Gait Distance (Feet): 12 Feet Assistive device: 1 person hand held assist Gait Pattern/deviations: Step-through pattern;Decreased step length - right;Decreased step length - left;Decreased stride length Gait velocity: decr   General Gait Details: requires assist for steadying   Stairs             Wheelchair Mobility    Modified Rankin (Stroke Patients Only)       Balance Overall balance assessment: Needs assistance;History of Falls Sitting-balance support: Feet supported Sitting balance-Leahy Scale: Good     Standing balance support: Single extremity supported;During functional activity Standing balance-Leahy Scale: Fair Standing balance comment: Min A to maintain standing position safely                            Cognition Arousal/Alertness: Awake/alert Behavior During Therapy: WFL for tasks assessed/performed Overall Cognitive Status: Within Functional Limits for tasks assessed                                 General Comments: Patient alert and oriented this visit.      Exercises      General Comments        Pertinent Vitals/Pain Pain Assessment: Faces Faces Pain Scale: Hurts whole lot Pain Location: L shoulder and back Pain Descriptors / Indicators: Aching;Constant;Discomfort;Guarding;Grimacing;Sore Pain Intervention(s): Limited activity within patient's tolerance;Monitored during session;Repositioned  Home Living                      Prior Function            PT Goals (current goals can now be found in the care plan section) Acute Rehab PT Goals Patient Stated Goal: to go to rehab then get back home PT Goal Formulation: With patient Time For Goal Achievement: 01/24/20 Potential to Achieve Goals: Good Progress towards PT goals: Progressing toward goals     Frequency    7X/week      PT Plan Current plan remains appropriate    Co-evaluation              AM-PAC PT "6 Clicks" Mobility   Outcome Measure  Help needed turning from your back to your side while in a flat bed without using bedrails?: A Lot Help needed moving from lying on your back to sitting on the side of a flat bed without using bedrails?: A Lot Help needed moving to and from a bed to a chair (including a wheelchair)?: A Little Help needed standing up from a chair using your arms (e.g., wheelchair or bedside chair)?: A Little Help needed to walk in hospital room?: A Little Help needed climbing 3-5 steps with a railing? : Total 6 Click Score: 14    End of Session   Activity Tolerance: Patient limited by pain Patient left: in chair;with call bell/phone within reach Nurse Communication: Mobility status PT Visit Diagnosis: History of falling (Z91.81);Difficulty in walking, not elsewhere classified (R26.2);Pain;Unsteadiness on feet (R26.81);Muscle weakness (generalized) (M62.81);Other abnormalities of gait and mobility (R26.89) Pain - Right/Left: Left Pain - part of body: Arm     Time: 2836-6294 PT Time Calculation (min) (ACUTE ONLY): 27 min  Charges:  $Gait Training: 8-22 mins $Therapeutic Activity: 8-22 mins                     [name], PT, GCS 01/16/20,10:06 AM

## 2020-01-16 NOTE — Consult Note (Signed)
ANTICOAGULATION CONSULT NOTE  Pharmacy Consult for Warfarin Indication: atrial fibrillation  Patient Measurements: Height: 5' (152.4 cm) Weight: 49.9 kg (110 lb) IBW/kg (Calculated) : 45.5   Vital Signs: Temp: 97.7 F (36.5 C) (01/10 0429) Temp Source: Oral (01/10 0429) BP: 134/70 (01/10 0429) Pulse Rate: 71 (01/10 0429)  Labs: Recent Labs    01/14/20 0437 01/15/20 0404 01/16/20 0451  HGB 12.7  --  12.4  HCT 37.8  --  36.2  PLT 316  --  346  LABPROT 18.7* 17.3* 15.2  INR 1.6* 1.5* 1.3*  CREATININE 0.50  --   --     Estimated Creatinine Clearance: 38.3 mL/min (by C-G formula based on SCr of 0.5 mg/dL).   Medical History: Past Medical History:  Diagnosis Date  . Asthma   . Breast cancer ([place]) 2010   RIGHT  . Cancer (HCC)    basel cell, s/p MOH'S/DR. DASHER  . GERD (gastroesophageal reflux disease) 06/12/11   EGD NORMAL  . Hx of mammogram 2014   NORMAL  . Hyperlipidemia   . Hypertension   . Osteopenia   . PAF (paroxysmal atrial fibrillation) ([place])   . 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 female with medical history of paroxysmal atrial fibrillation on chronic warfarin therapy. Presented to hospital after mechanical fall of home. S/p ORIF wrist fracture on 01/13/19. Pharmacy has been consulted to dose warfarin while inpatient. Current home regimen confirmed with patient is 2 mg M-W-F, and 1 mg Tu-Thus-Sat- Sun (Total weekly dose = 10 mg) INR therapeutic on admission at 3.0. H&H, platelets currently stable  Date   INR   Warfarin Dose 1/05   3.0   Held (admitted) 1/06   2.8   Held  1/07   1.8   Held 1/08   1.6   Held 1/09   1.5   2 mg 1/10   1.3   3 mg  Goal of Therapy:  INR 2-3 Monitor platelets by anticoagulation protocol: Yes   Plan:   INR subtherapeutic and trending down  Warfarin 3 mg dose  tonight (50% greater than home dose)  Will check INR tomorrow  morning  CBC at least every 3 days per protocol  Dallie Piles, PharmD, BCPS Clinical Pharmacist ' 01/16/2020,7:00 AM

## 2020-01-16 NOTE — Progress Notes (Signed)
Talked to Dr. Mal Misty about the subtherapeutic INR and the possibility of her being discharged to SNF, to see if he wanted to delay the discharge.  He acknowledged and said it was ok for INR to be a little low, start back on Coumadin and ok to discharge if able.

## 2020-01-17 DIAGNOSIS — S42352D Displaced comminuted fracture of shaft of humerus, left arm, subsequent encounter for fracture with routine healing: Secondary | ICD-10-CM | POA: Diagnosis not present

## 2020-01-17 DIAGNOSIS — W19XXXD Unspecified fall, subsequent encounter: Secondary | ICD-10-CM | POA: Diagnosis not present

## 2020-01-17 DIAGNOSIS — S62102D Fracture of unspecified carpal bone, left wrist, subsequent encounter for fracture with routine healing: Secondary | ICD-10-CM | POA: Diagnosis not present

## 2020-01-17 DIAGNOSIS — S065X9A Traumatic subdural hemorrhage with loss of consciousness of unspecified duration, initial encounter: Secondary | ICD-10-CM | POA: Diagnosis not present

## 2020-01-17 LAB — BASIC METABOLIC PANEL
Anion gap: 13 (ref 5–15)
BUN: 20 mg/dL (ref 8–23)
CO2: 29 mmol/L (ref 22–32)
Calcium: 9.7 mg/dL (ref 8.9–10.3)
Chloride: 96 mmol/L — ABNORMAL LOW (ref 98–111)
Creatinine, Ser: 0.68 mg/dL (ref 0.44–1.00)
GFR, Estimated: >60 mL/min (ref >60)
Glucose, Bld: 147 mg/dL — ABNORMAL HIGH (ref 70–99)
Potassium: 3.4 mmol/L — ABNORMAL LOW (ref 3.5–5.1)
Sodium: 138 mmol/L (ref 135–145)

## 2020-01-17 LAB — CBC
HCT: 34.8 % — ABNORMAL LOW (ref 36.0–46.0)
Hemoglobin: 12 g/dL (ref 12.0–15.0)
MCH: 31.2 pg (ref 26.0–34.0)
MCHC: 34.5 g/dL (ref 30.0–36.0)
MCV: 90.4 fL (ref 80.0–100.0)
Platelets: 391 10*3/uL (ref 150–400)
RBC: 3.85 MIL/uL — ABNORMAL LOW (ref 3.87–5.11)
RDW: 12.9 % (ref 11.5–15.5)
WBC: 7.8 10*3/uL (ref 4.0–10.5)
nRBC: 0 % (ref 0.0–0.2)

## 2020-01-17 LAB — PROTIME-INR
INR: 1.4 — ABNORMAL HIGH (ref 0.8–1.2)
Prothrombin Time: 16.4 seconds — ABNORMAL HIGH (ref 11.4–15.2)

## 2020-01-17 LAB — PHOSPHORUS: Phosphorus: 3.2 mg/dL (ref 2.5–4.6)

## 2020-01-17 MED ORDER — POTASSIUM CHLORIDE CRYS ER 20 MEQ PO TBCR
40.0000 meq | EXTENDED_RELEASE_TABLET | Freq: Once | ORAL | Status: AC
  Administered 2020-01-17: 40 meq via ORAL
  Filled 2020-01-17: qty 2

## 2020-01-17 NOTE — Progress Notes (Signed)
PT Cancellation Note  Patient Details Name: CHIANA WAMSER MRN: [id number] DOB: 1934-10-06   Cancelled Treatment:    Reason Eval/Treat Not Completed: Patient declined, no reason specified. Transport has been called to get patient to bring to rehab. Patient would prefer to rest at this time. Spoke with son regarding patient to use hemi walker for mobility at rehab facility. Will continue to follow until discharge.    Khaliel Morey 01/17/2020, 12:10 PM

## 2020-01-17 NOTE — Discharge Summary (Signed)
Physician Discharge Summary  CLARE HOUDESHELL [id number] DOB: 08-31-1934 DOA: 01/09/2020  PCP: [name], MD  Admit date: 01/09/2020 Discharge date: 01/17/2020  Discharge disposition: Skilled nursing facility   Recommendations for Outpatient Follow-Up:   Follow-up with Leim Fabry, orthopedic surgeon, in 1 week. Schedule an appointment to see Dr. Deetta Perla, neurosurgeon, for further evaluation of subdural hematoma. Schedule an appointment as soon as possible to see Dr. Margaretha Sheffield ,, ENT surgeon, for evaluation of maxillary sinus fracture.   Discharge Diagnosis:   Principal Problem:   Fall Active Problems:   Hypertension   Hyperlipidemia   PAF (paroxysmal atrial fibrillation) (HCC)   Asthma   GERD (gastroesophageal reflux disease)   Breast cancer (HCC)   Coagulopathy (HCC)   Subdural hematoma (HCC)   Leucocytosis   Hypokalemia   Closed comminuted left humeral fracture   Closed fracture of left wrist    Discharge Condition: Stable.  Diet recommendation:  Diet Order            Diet - low sodium heart healthy           Diet regular Room service appropriate? Yes; Fluid consistency: Thin  Diet effective now                   Code Status: DNR     Hospital Course:   Ms. GEORGANN ESCARSEGA is a 86 y.o. female with medical history including but not limited to breast cancer, GERD, hyperlipidemia, hypertension, paroxysmal atrial fibrillation on chronic warfarin therapy, basal cell carcinoma, who came to the hospital after sustaining a mechanical fall at home.  Work-up revealed subdural hemorrhage, mildly comminuted fracture of the left proximal humerus, comminuted left distal radius fracture and mildly depressed fracture of the left anterior maxillary sinus wall.  She was on Coumadin for atrial fibrillation and this was held because of subdural hemorrhage.  She was seen in consultation by neurosurgeon who recommended conservative management and holding  Coumadin for 1 week.  She was also seen by the orthopedic surgeon, Dr. Rudene Christians.  She underwent open reduction internal fixation of left wrist fracture on 01/13/2020.   She had hypokalemia, hypomagnesemia and hypophosphatemia that were treated. She was evaluated by PT and OT who recommended further rehabilitation at the skilled nursing facility.      Medical Consultants:    [place] [facility] surgeon   Discharge Exam:    Vitals:   01/16/20 1141 01/16/20 1617 01/16/20 2033 01/17/20 0435  BP: 131/83 (!) 124/55 (!) 115/59 138/63  Pulse: 93 73 84 82  Resp: 18 18 18 18   Temp: 98.1 F (36.7 C) 97.9 F (36.6 C) 98 F (36.7 C) 98 F (36.7 C)  TempSrc: Oral Oral Oral Oral  SpO2: 100% 100% 98% 100%  Weight:      Height:         GEN: NAD SKIN: No pallor or icterus. EYES: Left periorbital bruising.  Left subconjunctival hemorrhage ENT: MMM CV: RRR PULM: CTA B ABD: soft, ND, NT, +BS CNS: AAO x 3, non focal EXT: Dressing on the left wrist and forearm is clean, dry and intact   The results of significant diagnostics from this hospitalization (including imaging, microbiology, ancillary and laboratory) are listed below for reference.     Procedures and Diagnostic Studies:   DG Chest 1 View  Result Date: 01/09/2020 CLINICAL DATA:  Fall earlier today, fell on left side, initial encounter. EXAM: CHEST  1 VIEW COMPARISON:  08/05/2009. FINDINGS: Trachea is midline.  Heart is enlarged. Thoracic aorta is calcified. Biapical pleuroparenchymal scarring. Lungs are hyperinflated but clear. Osseous structures appear grossly intact. Degenerative changes in the right shoulder. IMPRESSION: 1. No acute findings. 2.  Aortic atherosclerosis (ICD10-I70.0). Electronically Signed   By: Lorin Picket M.D.   On: 01/09/2020 15:54   DG Elbow 2 Views Left  Result Date: 01/09/2020 CLINICAL DATA:  Pt reports that she slipped and fell in [employer] and landed on her left side. Pt has known wrist fracture,  and complains of pain up and down her left arm. Pt says the majority of her pain is under her armpit EXAM: LEFT ELBOW - 2 VIEW COMPARISON:  None. FINDINGS: No fracture or bone lesion. Elbow joint normally spaced and aligned.  No joint effusion. Soft tissues are unremarkable. IMPRESSION: Negative. Electronically Signed   By: Lajean Manes M.D.   On: 01/09/2020 14:17   DG Wrist Complete Left  Result Date: 01/09/2020 CLINICAL DATA:  Pt reports that she was in [employer] when she tripped over something and landed on her left side. Pt states that she has left wrist pain at this time that radiates up her arm. Unsure if she hit anything on the way down or how she caught herself EXAM: LEFT WRIST - COMPLETE 3+ VIEW COMPARISON:  None. FINDINGS: Comminuted fracture of the distal radius with intra-articular component. A volar fracture component is displaced anteriorly by 3 mm. Mild impaction of 2-3 mm. Distal radial articular surface has slight dorsal angulation of 5-6 degrees. There is mild loss of radial inclination. Wrist joints are normally aligned. There is diffuse surrounding soft tissue swelling. IMPRESSION: 1. Comminuted, intra-articular fracture the distal radius as detailed. No dislocation. Electronically Signed   By: Lajean Manes M.D.   On: 01/09/2020 13:16   CT HEAD WO CONTRAST  Result Date: 01/10/2020 CLINICAL DATA:  Subdural hemorrhage, follow-up EXAM: CT HEAD WITHOUT CONTRAST TECHNIQUE: Contiguous axial images were obtained from the base of the skull through the vertex without intravenous contrast. COMPARISON:  01/09/2020 FINDINGS: Brain: Small volume subdural hemorrhage is again identified along the posterior falx and right tentorial leaflet. No significant change allowing for some interval redistribution. No new hemorrhage. Ventricles are stable in size. There are stable findings of chronic microvascular ischemic changes. Gray-white differentiation is preserved. Vascular: No new findings. Skull: No new  findings. Sinuses/Orbits: No acute finding. Other: None. IMPRESSION: No substantial change in small volume subdural hemorrhage. No new hemorrhage. Electronically Signed   By: Macy Mis M.D.   On: 01/10/2020 21:58   CT Head Wo Contrast  Result Date: 01/09/2020 CLINICAL DATA:  Follow-up. Patient fell at the grocery store. On blood thinners. CT obtained earlier today demonstrates a subdural hemorrhage along the right tentorium. EXAM: CT HEAD WITHOUT CONTRAST TECHNIQUE: Contiguous axial images were obtained from the base of the skull through the vertex without intravenous contrast. COMPARISON:  None. FINDINGS: Brain: Small subdural hemorrhage along the right tentorium is unchanged. No evidence of new intracranial hemorrhage. Ventricular and sulcal enlargement consistent with atrophy is stable. Patchy white matter hypoattenuation consistent with chronic microvascular ischemic changes stable. There are no parenchymal masses or mass effect, no midline shift, no evidence of ischemic infarct and no extra-axial masses. Vascular: No hyperdense vessel or unexpected calcification. Skull: No skull fracture or bone lesion. Sinuses/Orbits: Visualized globes and orbits are unremarkable. Small amount of dependent secretions in the right sphenoid sinus dependent fluid in the left maxillary sinus. These findings are stable. Other: None. IMPRESSION: 1. No change  from the earlier head CT. 2. Small acute subdural hematoma along the right tentorium. No new intracranial hemorrhage. Electronically Signed   By: Lajean Manes M.D.   On: 01/09/2020 19:34   CT Head Wo Contrast  Result Date: 01/09/2020 CLINICAL DATA:  Head trauma. Trip and fall. Left supraorbital hematoma. Patient is on blood thinners. EXAM: CT HEAD WITHOUT CONTRAST CT MAXILLOFACIAL WITHOUT CONTRAST TECHNIQUE: Multidetector CT imaging of the head and maxillofacial structures were performed using the standard protocol without intravenous contrast. Multiplanar CT image  reconstructions of the maxillofacial structures were also generated. COMPARISON:  October 18, 2018. FINDINGS: CT HEAD FINDINGS Brain: Thin (3-4 mm thick) acute subdural hemorrhage layering along the right tentorial leaflet (series 4, image 45), new from prior. No evidence of acute infarction, hydrocephalus, extra-axial collection or mass lesion/mass effect. Patchy white matter hypoattenuation, most likely related to chronic microvascular ischemic disease. Similar generalized cerebral volume loss with ex vacuo ventricular dilation. Vascular: Calcific atherosclerosis. Skull: No acute fracture. Other: No mastoid effusions. CT MAXILLOFACIAL FINDINGS Osseous: Acute mildly depressed fracture of the left anterior maxillary sinus wall with nondisplaced extension through the posterior maxillary sinus wall Orbits: Globes are symmetric and within normal limits. No retro bulbar hemorrhage. No evidence of acute orbital fracture. Sinuses: Left maxillary hemosinus. Frothy secretions in the right sphenoid sinus. Small right maxillary retention cyst. Soft tissues: Left periorbital and premaxillary soft tissue contusion. IMPRESSION: 1. Thin (3-4 mm thick) acute subdural hemorrhage layering along the right tentorial leaflet. No substantial mass effect. 2. Left periorbital and premaxillary soft tissue contusion with acute mildly depressed fracture of the left anterior maxillary sinus wall with nondisplaced extension through the posterior maxillary sinus wall. Findings were discussed with Dr. Charna Archer At 1:16 p.m. via telephone. Electronically Signed   By: Margaretha Sheffield MD   On: 01/09/2020 13:20   CT Cervical Spine Wo Contrast  Result Date: 01/09/2020 CLINICAL DATA:  Neck trauma. Status post fall. LEFT orbital hematoma and subdural hemorrhage. EXAM: CT CERVICAL SPINE WITHOUT CONTRAST TECHNIQUE: Multidetector CT imaging of the cervical spine was performed without intravenous contrast. Multiplanar CT image reconstructions were also  generated. COMPARISON:  CT of the head earlier today FINDINGS: Alignment: There is loss of cervical lordosis. Degenerative changes are identified at C5-6. There is 2 millimeters of anterolisthesis of C4 on C5. Otherwise alignment is normal. Skull base and vertebrae: No acute fracture. No primary bone lesion or focal pathologic process. Soft tissues and spinal canal: No prevertebral fluid or swelling. No visible canal hematoma. Disc levels: Disc height loss and uncovertebral spurring primarily at C5-6. Upper chest: Negative. Other: None. IMPRESSION: 1. No evidence for acute cervical spine abnormality. 2. Significant degenerative changes at C5-6. 3. Loss of cervical lordosis. Electronically Signed   By: Nolon Nations M.D.   On: 01/09/2020 14:04   DG Pelvis Portable  Result Date: 01/09/2020 CLINICAL DATA:  [place] at the grocery store earlier today onto her left side. Pain. EXAM: PORTABLE PELVIS 1-2 VIEWS COMPARISON:  None. FINDINGS: No fracture or bone lesion. Hip joints, SI joints and symphysis pubis are normally spaced and aligned. No significant arthropathic change. Soft tissues are unremarkable. There are disc degenerative changes of the lower lumbar spine. IMPRESSION: 1. No fracture or acute finding.  No significant joint abnormality Electronically Signed   By: Lajean Manes M.D.   On: 01/09/2020 15:53   DG Shoulder Left  Result Date: 01/09/2020 CLINICAL DATA:  Slipped and fell landing on the left side. Left shoulder pain. EXAM: LEFT SHOULDER -  2+ VIEW COMPARISON:  None. FINDINGS: Comminuted fracture of the proximal humerus. There is a transverse fracture across the surgical neck with additional fracture across the base of the greater tuberosity. Tuberosity fracture component is mildly displaced, 5-6 mm laterally. There is no significant fracture angulation. Glenohumeral joint remains normally aligned. AC joint normally spaced and aligned. Skeletal structures are demineralized. IMPRESSION: 1. Mildly  comminuted fracture of the proximal left humerus as described. No dislocation. Electronically Signed   By: Lajean Manes M.D.   On: 01/09/2020 14:16   CT MAXILLOFACIAL WO CONTRAST  Result Date: 01/09/2020 CLINICAL DATA:  Head trauma. Trip and fall. Left supraorbital hematoma. Patient is on blood thinners. EXAM: CT HEAD WITHOUT CONTRAST CT MAXILLOFACIAL WITHOUT CONTRAST TECHNIQUE: Multidetector CT imaging of the head and maxillofacial structures were performed using the standard protocol without intravenous contrast. Multiplanar CT image reconstructions of the maxillofacial structures were also generated. COMPARISON:  October 18, 2018. FINDINGS: CT HEAD FINDINGS Brain: Thin (3-4 mm thick) acute subdural hemorrhage layering along the right tentorial leaflet (series 4, image 45), new from prior. No evidence of acute infarction, hydrocephalus, extra-axial collection or mass lesion/mass effect. Patchy white matter hypoattenuation, most likely related to chronic microvascular ischemic disease. Similar generalized cerebral volume loss with ex vacuo ventricular dilation. Vascular: Calcific atherosclerosis. Skull: No acute fracture. Other: No mastoid effusions. CT MAXILLOFACIAL FINDINGS Osseous: Acute mildly depressed fracture of the left anterior maxillary sinus wall with nondisplaced extension through the posterior maxillary sinus wall Orbits: Globes are symmetric and within normal limits. No retro bulbar hemorrhage. No evidence of acute orbital fracture. Sinuses: Left maxillary hemosinus. Frothy secretions in the right sphenoid sinus. Small right maxillary retention cyst. Soft tissues: Left periorbital and premaxillary soft tissue contusion. IMPRESSION: 1. Thin (3-4 mm thick) acute subdural hemorrhage layering along the right tentorial leaflet. No substantial mass effect. 2. Left periorbital and premaxillary soft tissue contusion with acute mildly depressed fracture of the left anterior maxillary sinus wall with  nondisplaced extension through the posterior maxillary sinus wall. Findings were discussed with Dr. Charna Archer At 1:16 p.m. via telephone. Electronically Signed   By: Margaretha Sheffield MD   On: 01/09/2020 13:20     Labs:   Basic Metabolic Panel: Recent Labs  Lab 01/12/20 0612 01/13/20 0439 01/14/20 0437 01/17/20 0929  NA 133* 138 137 138  K 3.2* 3.9 3.6 3.4*  CL 95* 99 99 96*  CO2 28 29 28 29   GLUCOSE 97 115* 148* 147*  BUN 13 15 13 20   CREATININE 0.73 0.66 0.50 0.68  CALCIUM 9.0 8.8* 9.5 9.7  MG 1.6* 1.9 1.8  --   PHOS 1.6* 3.2 1.8* 3.2   GFR Estimated Creatinine Clearance: 38.3 mL/min (by C-G formula based on SCr of 0.68 mg/dL). Liver Function Tests: No results for input(s): AST, ALT, ALKPHOS, BILITOT, PROT, ALBUMIN in the last 168 hours. No results for input(s): LIPASE, AMYLASE in the last 168 hours. No results for input(s): AMMONIA in the last 168 hours. Coagulation profile Recent Labs  Lab 01/13/20 0439 01/14/20 0437 01/15/20 0404 01/16/20 0451 01/17/20 0929  INR 1.8* 1.6* 1.5* 1.3* 1.4*    CBC: Recent Labs  Lab 01/14/20 0437 01/16/20 0451 01/17/20 0929  WBC 8.9 7.3 7.8  NEUTROABS 7.3  --   --   HGB 12.7 12.4 12.0  HCT 37.8 36.2 34.8*  MCV 90.6 89.4 90.4  PLT 316 346 391   Cardiac Enzymes: No results for input(s): CKTOTAL, CKMB, CKMBINDEX, TROPONINI in the last 168 hours. BNP:  Invalid input(s): POCBNP CBG: No results for input(s): GLUCAP in the last 168 hours. D-Dimer No results for input(s): DDIMER in the last 72 hours. Hgb A1c No results for input(s): HGBA1C in the last 72 hours. Lipid Profile No results for input(s): CHOL, HDL, LDLCALC, TRIG, CHOLHDL, LDLDIRECT in the last 72 hours. Thyroid function studies No results for input(s): TSH, T4TOTAL, T3FREE, THYROIDAB in the last 72 hours.  Invalid input(s): FREET3 Anemia work up No results for input(s): VITAMINB12, FOLATE, FERRITIN, TIBC, IRON, RETICCTPCT in the last 72  hours. Microbiology Recent Results (from the past 240 hour(s))  Resp Panel by RT-PCR (Flu A&B, Covid) Nasopharyngeal Swab     Status: None   Collection Time: 01/09/20  9:14 PM   Specimen: Nasopharyngeal Swab; Nasopharyngeal(NP) swabs in vial transport medium  Result Value Ref Range Status   SARS Coronavirus 2 by RT PCR NEGATIVE NEGATIVE Final    Comment: (NOTE) SARS-CoV-2 target nucleic acids are NOT DETECTED.  The SARS-CoV-2 RNA is generally detectable in upper respiratory specimens during the acute phase of infection. The lowest concentration of SARS-CoV-2 viral copies this assay can detect is 138 copies/mL. A negative result does not preclude SARS-Cov-2 infection and should not be used as the sole basis for treatment or other patient management decisions. A negative result may occur with  improper specimen collection/handling, submission of specimen other than nasopharyngeal swab, presence of viral mutation(s) within the areas targeted by this assay, and inadequate number of viral copies(<138 copies/mL). A negative result must be combined with clinical observations, patient history, and epidemiological information. The expected result is Negative.  Fact Sheet for Patients:  EntrepreneurPulse.com.au  Fact Sheet for Healthcare Providers:  IncredibleEmployment.be  This test is no t yet approved or cleared by the [place] FDA and  has been authorized for detection and/or diagnosis of SARS-CoV-2 by FDA under an Emergency Use Authorization (EUA). This EUA will remain  in effect (meaning this test can be used) for the duration of the COVID-19 declaration under Section 564(b)(1) of the Act, 21 U.S.C.section 360bbb-3(b)(1), unless the authorization is terminated  or revoked sooner.       Influenza A by PCR NEGATIVE NEGATIVE Final   Influenza B by PCR NEGATIVE NEGATIVE Final    Comment: (NOTE) The Xpert Xpress SARS-CoV-2/FLU/RSV plus assay  is intended as an aid in the diagnosis of influenza from Nasopharyngeal swab specimens and should not be used as a sole basis for treatment. Nasal washings and aspirates are unacceptable for Xpert Xpress SARS-CoV-2/FLU/RSV testing.  Fact Sheet for Patients: EntrepreneurPulse.com.au  Fact Sheet for Healthcare Providers: IncredibleEmployment.be  This test is not yet approved or cleared by the [place] FDA and has been authorized for detection and/or diagnosis of SARS-CoV-2 by FDA under an Emergency Use Authorization (EUA). This EUA will remain in effect (meaning this test can be used) for the duration of the COVID-19 declaration under Section 564(b)(1) of the Act, 21 U.S.C. section 360bbb-3(b)(1), unless the authorization is terminated or revoked.  Performed at [facility], [address] [place], [place] [address]   Resp Panel by RT-PCR (Flu A&B, Covid) Nasopharyngeal Swab     Status: None   Collection Time: 01/16/20  1:05 PM   Specimen: Nasopharyngeal Swab; Nasopharyngeal(NP) swabs in vial transport medium  Result Value Ref Range Status   SARS Coronavirus 2 by RT PCR NEGATIVE NEGATIVE Final    Comment: (NOTE) SARS-CoV-2 target nucleic acids are NOT DETECTED.  The SARS-CoV-2 RNA is generally detectable in upper respiratory  specimens during the acute phase of infection. The lowest concentration of SARS-CoV-2 viral copies this assay can detect is 138 copies/mL. A negative result does not preclude SARS-Cov-2 infection and should not be used as the sole basis for treatment or other patient management decisions. A negative result may occur with  improper specimen collection/handling, submission of specimen other than nasopharyngeal swab, presence of viral mutation(s) within the areas targeted by this assay, and inadequate number of viral copies(<138 copies/mL). A negative result must be combined with clinical observations, patient  history, and epidemiological information. The expected result is Negative.  Fact Sheet for Patients:  EntrepreneurPulse.com.au  Fact Sheet for Healthcare Providers:  IncredibleEmployment.be  This test is no t yet approved or cleared by the [place] FDA and  has been authorized for detection and/or diagnosis of SARS-CoV-2 by FDA under an Emergency Use Authorization (EUA). This EUA will remain  in effect (meaning this test can be used) for the duration of the COVID-19 declaration under Section 564(b)(1) of the Act, 21 U.S.C.section 360bbb-3(b)(1), unless the authorization is terminated  or revoked sooner.       Influenza A by PCR NEGATIVE NEGATIVE Final   Influenza B by PCR NEGATIVE NEGATIVE Final    Comment: (NOTE) The Xpert Xpress SARS-CoV-2/FLU/RSV plus assay is intended as an aid in the diagnosis of influenza from Nasopharyngeal swab specimens and should not be used as a sole basis for treatment. Nasal washings and aspirates are unacceptable for Xpert Xpress SARS-CoV-2/FLU/RSV testing.  Fact Sheet for Patients: EntrepreneurPulse.com.au  Fact Sheet for Healthcare Providers: IncredibleEmployment.be  This test is not yet approved or cleared by the [place] FDA and has been authorized for detection and/or diagnosis of SARS-CoV-2 by FDA under an Emergency Use Authorization (EUA). This EUA will remain in effect (meaning this test can be used) for the duration of the COVID-19 declaration under Section 564(b)(1) of the Act, 21 U.S.C. section 360bbb-3(b)(1), unless the authorization is terminated or revoked.  Performed at [facility], [address] [place], [place] [address]      Discharge Instructions:   Discharge Instructions    Diet - low sodium heart healthy   Complete by: As directed    Discharge instructions   Complete by: As directed    Monitor INR closely and adjust  warfarin dose accordingly.   Discharge wound care:   Complete by: As directed    Keep surgical wound clean and dry   Increase activity slowly   Complete by: As directed      Allergies as of 01/17/2020      Reactions   Flagyl [metronidazole] Nausea Only   Pacerone [amiodarone]    Eye deposits   Ramipril Cough      Medication List    TAKE these medications   alendronate 70 MG tablet Commonly known as: FOSAMAX Take 70 mg by mouth once a week. (Sundays)   amLODipine 5 MG tablet Commonly known as: NORVASC Take 5 mg by mouth daily.   atorvastatin 10 MG tablet Commonly known as: LIPITOR Take 10 mg by mouth at bedtime.   lidocaine 5 % Commonly known as: Lidoderm Place 1 patch onto the skin every 12 (twelve) hours. Remove & Discard patch within 12 hours or as directed by MD   metoprolol tartrate 25 MG tablet Commonly known as: LOPRESSOR Take 25 mg by mouth 2 (two) times daily.   oxyCODONE-acetaminophen 5-325 MG tablet Commonly known as: Percocet Take 1 tablet by mouth every 4 (four) hours as  needed for severe pain.   warfarin 2 MG tablet Commonly known as: COUMADIN Take 2 mg by mouth daily. TAKE AS DIRECTED.            Discharge Care Instructions  (From admission, onward)         Start     Ordered   01/17/20 0000  Discharge wound care:       Comments: Keep surgical wound clean and dry   01/17/20 0956          Contact information for follow-up providers    Schedule an appointment as soon as possible for a visit  with Deetta Perla, MD.   Specialty: Neurosurgery Why: For your brain bleed Contact information: [place] [address] [phone]        Schedule an appointment as soon as possible for a visit  with Leim Fabry, MD.   Specialty: Orthopedic Surgery Why: For your Left arm fractures Contact information: [place] [address] [phone]        Margaretha Sheffield, MD.   Specialty:  Otolaryngology Why: For your maxillary sinus fracture Contact information: [address] [place] [address] [phone]        [place].   Specialty: Emergency Medicine Why: If symptoms worsen Contact information: [place] [id number] [address] [place] [phone]           Contact information for after-discharge care    Destination    [facility] Preferred SNF .   Service: Skilled Nursing Contact information: [address] [place] [phone]                   Time coordinating discharge: 33 minutes  Signed:  Jennye Boroughs  [facility] 01/17/2020, 9:57 AM   Pager on [email or web address]. If 7PM-7AM, please contact night-coverage at [email or web address]

## 2020-01-17 NOTE — Progress Notes (Signed)
   Subjective: 4 Days Post-Op Procedure(s) (LRB): OPEN REDUCTION INTERNAL FIXATION (ORIF) WRIST FRACTURE (Left) Patient reports pain as mild.  Pain and swelling improving within left hand. Patient is well, and has had no acute complaints or problems We will continue with physical therapy today.   Objective: Vital signs in last 24 hours: Temp:  [97.9 F (36.6 C)-98.1 F (36.7 C)] 98 F (36.7 C) (01/11 1009) Pulse Rate:  [73-93] 91 (01/11 1009) Resp:  [18] 18 (01/11 0435) BP: (115-138)/(55-83) 138/58 (01/11 1009) SpO2:  [98 %-100 %] 98 % (01/11 1009)  Intake/Output from previous day: 01/10 0701 - 01/11 0700 In: 480 [P.O.:480] Out: 400 [Urine:400] Intake/Output this shift: No intake/output data recorded.  Recent Labs    01/16/20 0451 01/17/20 0929  HGB 12.4 12.0   Recent Labs    01/16/20 0451 01/17/20 0929  WBC 7.3 7.8  RBC 4.05 3.85*  HCT 36.2 34.8*  PLT 346 391   Recent Labs    01/17/20 0929  NA 138  K 3.4*  CL 96*  CO2 29  BUN 20  CREATININE 0.68  GLUCOSE 147*  CALCIUM 9.7   Recent Labs    01/16/20 0451 01/17/20 0929  INR 1.3* 1.4*    EXAM General - Patient is Alert, Appropriate and Oriented Left upper Extremity - Neurovascular intact Sensation intact distally Intact pulses distally No cellulitis present Compartment soft volar splint intact with out drainage. able to move fingers and make a fist Dressing - dressing C/D/I  Past Medical History:  Diagnosis Date  . Asthma   . Breast cancer ([place]) 2010   RIGHT  . Cancer (HCC)    basel cell, s/p MOH'S/DR. DASHER  . GERD (gastroesophageal reflux disease) 06/12/11   EGD NORMAL  . Hx of mammogram 2014   NORMAL  . Hyperlipidemia   . Hypertension   . Osteopenia   . PAF (paroxysmal atrial fibrillation) ([place])   . 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   Weight as of this encounter: 49.9 kg. Advance diet Up with therapy, PWB LUE with platform walker Splint removed today, velcro wrist applied today Follow up with [facility] orthopedics 2 weeks post op.  Elevate LUE, work on LUE digit ROM    T. Rachelle Hora, PA-C [place] 01/17/2020, 11:36 AM

## 2020-01-31 ENCOUNTER — Encounter: Payer: Self-pay | Admitting: Orthopedic Surgery

## 2021-08-08 ENCOUNTER — Other Ambulatory Visit: Payer: Self-pay | Admitting: Physician Assistant

## 2021-08-08 DIAGNOSIS — R2681 Unsteadiness on feet: Secondary | ICD-10-CM

## 2021-08-08 DIAGNOSIS — R413 Other amnesia: Secondary | ICD-10-CM

## 2021-08-08 DIAGNOSIS — S065XAA Traumatic subdural hemorrhage with loss of consciousness status unknown, initial encounter: Secondary | ICD-10-CM

## 2021-08-13 ENCOUNTER — Ambulatory Visit
Admission: RE | Admit: 2021-08-13 | Discharge: 2021-08-13 | Disposition: A | Discharge status: Home / Self Care | Payer: Medicare Other | Source: Ambulatory Visit | Attending: Physician Assistant | Admitting: Physician Assistant

## 2021-08-13 DIAGNOSIS — R413 Other amnesia: Secondary | ICD-10-CM | POA: Diagnosis present

## 2021-08-13 DIAGNOSIS — R2681 Unsteadiness on feet: Secondary | ICD-10-CM | POA: Insufficient documentation

## 2021-08-13 DIAGNOSIS — S065XAA Traumatic subdural hemorrhage with loss of consciousness status unknown, initial encounter: Secondary | ICD-10-CM | POA: Diagnosis present

## 2021-10-03 ENCOUNTER — Emergency Department: Payer: Medicare Other

## 2021-10-03 ENCOUNTER — Other Ambulatory Visit: Payer: Self-pay

## 2021-10-03 ENCOUNTER — Inpatient Hospital Stay
Admission: EM | Admit: 2021-10-03 | Discharge: 2021-10-11 | DRG: 689 | Disposition: A | Discharge status: Home Health | Payer: Medicare Other | Attending: Internal Medicine | Admitting: Internal Medicine

## 2021-10-03 DIAGNOSIS — K219 Gastro-esophageal reflux disease without esophagitis: Secondary | ICD-10-CM | POA: Diagnosis present

## 2021-10-03 DIAGNOSIS — F05 Delirium due to known physiological condition: Secondary | ICD-10-CM | POA: Diagnosis present

## 2021-10-03 DIAGNOSIS — Z8249 Family history of ischemic heart disease and other diseases of the circulatory system: Secondary | ICD-10-CM

## 2021-10-03 DIAGNOSIS — Z85828 Personal history of other malignant neoplasm of skin: Secondary | ICD-10-CM

## 2021-10-03 DIAGNOSIS — G9341 Metabolic encephalopathy: Secondary | ICD-10-CM | POA: Diagnosis not present

## 2021-10-03 DIAGNOSIS — R4182 Altered mental status, unspecified: Principal | ICD-10-CM

## 2021-10-03 DIAGNOSIS — B9620 Unspecified Escherichia coli [E. coli] as the cause of diseases classified elsewhere: Secondary | ICD-10-CM | POA: Diagnosis present

## 2021-10-03 DIAGNOSIS — M25511 Pain in right shoulder: Secondary | ICD-10-CM | POA: Diagnosis present

## 2021-10-03 DIAGNOSIS — Z853 Personal history of malignant neoplasm of breast: Secondary | ICD-10-CM

## 2021-10-03 DIAGNOSIS — I10 Essential (primary) hypertension: Secondary | ICD-10-CM | POA: Diagnosis present

## 2021-10-03 DIAGNOSIS — N390 Urinary tract infection, site not specified: Principal | ICD-10-CM | POA: Diagnosis present

## 2021-10-03 DIAGNOSIS — W010XXA Fall on same level from slipping, tripping and stumbling without subsequent striking against object, initial encounter: Secondary | ICD-10-CM | POA: Diagnosis present

## 2021-10-03 DIAGNOSIS — Z7983 Long term (current) use of bisphosphonates: Secondary | ICD-10-CM

## 2021-10-03 DIAGNOSIS — R791 Abnormal coagulation profile: Secondary | ICD-10-CM | POA: Diagnosis present

## 2021-10-03 DIAGNOSIS — Z9011 Acquired absence of right breast and nipple: Secondary | ICD-10-CM

## 2021-10-03 DIAGNOSIS — J45909 Unspecified asthma, uncomplicated: Secondary | ICD-10-CM | POA: Diagnosis present

## 2021-10-03 DIAGNOSIS — Z90710 Acquired absence of both cervix and uterus: Secondary | ICD-10-CM

## 2021-10-03 DIAGNOSIS — E785 Hyperlipidemia, unspecified: Secondary | ICD-10-CM | POA: Diagnosis present

## 2021-10-03 DIAGNOSIS — Z7901 Long term (current) use of anticoagulants: Secondary | ICD-10-CM

## 2021-10-03 DIAGNOSIS — M19011 Primary osteoarthritis, right shoulder: Secondary | ICD-10-CM | POA: Diagnosis present

## 2021-10-03 DIAGNOSIS — F0390 Unspecified dementia without behavioral disturbance: Secondary | ICD-10-CM | POA: Diagnosis present

## 2021-10-03 DIAGNOSIS — N3000 Acute cystitis without hematuria: Secondary | ICD-10-CM

## 2021-10-03 DIAGNOSIS — Z9181 History of falling: Secondary | ICD-10-CM

## 2021-10-03 DIAGNOSIS — I480 Paroxysmal atrial fibrillation: Secondary | ICD-10-CM | POA: Diagnosis present

## 2021-10-03 DIAGNOSIS — Z79899 Other long term (current) drug therapy: Secondary | ICD-10-CM

## 2021-10-03 DIAGNOSIS — Z888 Allergy status to other drugs, medicaments and biological substances status: Secondary | ICD-10-CM

## 2021-10-03 DIAGNOSIS — Y92009 Unspecified place in unspecified non-institutional (private) residence as the place of occurrence of the external cause: Secondary | ICD-10-CM

## 2021-10-03 DIAGNOSIS — M8580 Other specified disorders of bone density and structure, unspecified site: Secondary | ICD-10-CM | POA: Diagnosis present

## 2021-10-03 DIAGNOSIS — Z66 Do not resuscitate: Secondary | ICD-10-CM | POA: Diagnosis present

## 2021-10-03 LAB — URINALYSIS, ROUTINE W REFLEX MICROSCOPIC
Bilirubin Urine: NEGATIVE
Glucose, UA: NEGATIVE mg/dL
Ketones, ur: NEGATIVE mg/dL
Nitrite: POSITIVE — AB
Protein, ur: NEGATIVE mg/dL
Specific Gravity, Urine: 1.018 (ref 1.005–1.030)
pH: 5 (ref 5.0–8.0)

## 2021-10-03 LAB — CBC WITH DIFFERENTIAL/PLATELET
Abs Immature Granulocytes: 0.02 10*3/uL (ref 0.00–0.07)
Basophils Absolute: 0.1 10*3/uL (ref 0.0–0.1)
Basophils Relative: 1 %
Eosinophils Absolute: 0.2 10*3/uL (ref 0.0–0.5)
Eosinophils Relative: 3 %
HCT: 45.7 % (ref 36.0–46.0)
Hemoglobin: 14.7 g/dL (ref 12.0–15.0)
Immature Granulocytes: 0 %
Lymphocytes Relative: 25 %
Lymphs Abs: 1.9 10*3/uL (ref 0.7–4.0)
MCH: 29.9 pg (ref 26.0–34.0)
MCHC: 32.2 g/dL (ref 30.0–36.0)
MCV: 93.1 fL (ref 80.0–100.0)
Monocytes Absolute: 0.8 10*3/uL (ref 0.1–1.0)
Monocytes Relative: 10 %
Neutro Abs: 4.7 10*3/uL (ref 1.7–7.7)
Neutrophils Relative %: 61 %
Platelets: 270 10*3/uL (ref 150–400)
RBC: 4.91 MIL/uL (ref 3.87–5.11)
RDW: 13.2 % (ref 11.5–15.5)
WBC: 7.5 10*3/uL (ref 4.0–10.5)
nRBC: 0 % (ref 0.0–0.2)

## 2021-10-03 LAB — COMPREHENSIVE METABOLIC PANEL
ALT: 17 U/L (ref 0–44)
AST: 30 U/L (ref 15–41)
Albumin: 4.5 g/dL (ref 3.5–5.0)
Alkaline Phosphatase: 77 U/L (ref 38–126)
Anion gap: 9 (ref 5–15)
BUN: 20 mg/dL (ref 8–23)
CO2: 32 mmol/L (ref 22–32)
Calcium: 10.7 mg/dL — ABNORMAL HIGH (ref 8.9–10.3)
Chloride: 102 mmol/L (ref 98–111)
Creatinine, Ser: 0.95 mg/dL (ref 0.44–1.00)
GFR, Estimated: 59 mL/min — ABNORMAL LOW (ref >60)
Glucose, Bld: 113 mg/dL — ABNORMAL HIGH (ref 70–99)
Potassium: 3.8 mmol/L (ref 3.5–5.1)
Sodium: 143 mmol/L (ref 135–145)
Total Bilirubin: 1 mg/dL (ref 0.3–1.2)
Total Protein: 8.1 g/dL (ref 6.5–8.1)

## 2021-10-03 LAB — PROTIME-INR
INR: 1.6 — ABNORMAL HIGH (ref 0.8–1.2)
Prothrombin Time: 18.9 seconds — ABNORMAL HIGH (ref 11.4–15.2)

## 2021-10-03 LAB — TROPONIN I (HIGH SENSITIVITY): Troponin I (High Sensitivity): 8 ng/L (ref <18)

## 2021-10-03 NOTE — ED Notes (Signed)
Pt unable to tell this RN the date and location. Sts that she was told she was going to go to the hospital so she thinks she might be there. Son is at the bedside. Sts pt has been increasingly more confused. Threw away whole unopened meals. Told someone else that she was in the hospital with broken bones. Pt is alert and cheerful while speaking with this RN.

## 2021-10-03 NOTE — ED Triage Notes (Signed)
Pt arrives with c/o a fall that happened today. Pt takes coumadin. Pt denies hitting her head. Pt c/o coccyx pain and right shoulder pain. Pt does have some memory impairment. Per son, pt is more altered than normal the patienta hs been seeing neurology.

## 2021-10-03 NOTE — ED Notes (Signed)
Pt brought to ED rm 16 at this time, this RN now assuming care. 

## 2021-10-04 ENCOUNTER — Encounter: Payer: Self-pay | Admitting: Internal Medicine

## 2021-10-04 DIAGNOSIS — Z9011 Acquired absence of right breast and nipple: Secondary | ICD-10-CM | POA: Diagnosis not present

## 2021-10-04 DIAGNOSIS — N390 Urinary tract infection, site not specified: Secondary | ICD-10-CM | POA: Diagnosis present

## 2021-10-04 DIAGNOSIS — Z90710 Acquired absence of both cervix and uterus: Secondary | ICD-10-CM | POA: Diagnosis not present

## 2021-10-04 DIAGNOSIS — B9620 Unspecified Escherichia coli [E. coli] as the cause of diseases classified elsewhere: Secondary | ICD-10-CM | POA: Diagnosis present

## 2021-10-04 DIAGNOSIS — Y92009 Unspecified place in unspecified non-institutional (private) residence as the place of occurrence of the external cause: Secondary | ICD-10-CM | POA: Diagnosis not present

## 2021-10-04 DIAGNOSIS — Z85828 Personal history of other malignant neoplasm of skin: Secondary | ICD-10-CM | POA: Diagnosis not present

## 2021-10-04 DIAGNOSIS — W19XXXA Unspecified fall, initial encounter: Secondary | ICD-10-CM | POA: Diagnosis not present

## 2021-10-04 DIAGNOSIS — Z888 Allergy status to other drugs, medicaments and biological substances status: Secondary | ICD-10-CM | POA: Diagnosis not present

## 2021-10-04 DIAGNOSIS — F0390 Unspecified dementia without behavioral disturbance: Secondary | ICD-10-CM | POA: Diagnosis present

## 2021-10-04 DIAGNOSIS — M25511 Pain in right shoulder: Secondary | ICD-10-CM | POA: Diagnosis not present

## 2021-10-04 DIAGNOSIS — Z7901 Long term (current) use of anticoagulants: Secondary | ICD-10-CM

## 2021-10-04 DIAGNOSIS — M8580 Other specified disorders of bone density and structure, unspecified site: Secondary | ICD-10-CM | POA: Diagnosis present

## 2021-10-04 DIAGNOSIS — Z7983 Long term (current) use of bisphosphonates: Secondary | ICD-10-CM | POA: Diagnosis not present

## 2021-10-04 DIAGNOSIS — Z8249 Family history of ischemic heart disease and other diseases of the circulatory system: Secondary | ICD-10-CM | POA: Diagnosis not present

## 2021-10-04 DIAGNOSIS — Z66 Do not resuscitate: Secondary | ICD-10-CM | POA: Diagnosis present

## 2021-10-04 DIAGNOSIS — G9341 Metabolic encephalopathy: Secondary | ICD-10-CM | POA: Diagnosis present

## 2021-10-04 DIAGNOSIS — Z79899 Other long term (current) drug therapy: Secondary | ICD-10-CM | POA: Diagnosis not present

## 2021-10-04 DIAGNOSIS — R791 Abnormal coagulation profile: Secondary | ICD-10-CM | POA: Diagnosis present

## 2021-10-04 DIAGNOSIS — F03918 Unspecified dementia, unspecified severity, with other behavioral disturbance: Secondary | ICD-10-CM | POA: Diagnosis not present

## 2021-10-04 DIAGNOSIS — I10 Essential (primary) hypertension: Secondary | ICD-10-CM | POA: Diagnosis present

## 2021-10-04 DIAGNOSIS — Z853 Personal history of malignant neoplasm of breast: Secondary | ICD-10-CM | POA: Diagnosis not present

## 2021-10-04 DIAGNOSIS — I480 Paroxysmal atrial fibrillation: Secondary | ICD-10-CM | POA: Diagnosis present

## 2021-10-04 DIAGNOSIS — J45909 Unspecified asthma, uncomplicated: Secondary | ICD-10-CM | POA: Diagnosis present

## 2021-10-04 DIAGNOSIS — F05 Delirium due to known physiological condition: Secondary | ICD-10-CM | POA: Diagnosis present

## 2021-10-04 DIAGNOSIS — M19011 Primary osteoarthritis, right shoulder: Secondary | ICD-10-CM | POA: Diagnosis present

## 2021-10-04 DIAGNOSIS — W010XXA Fall on same level from slipping, tripping and stumbling without subsequent striking against object, initial encounter: Secondary | ICD-10-CM | POA: Diagnosis present

## 2021-10-04 DIAGNOSIS — E785 Hyperlipidemia, unspecified: Secondary | ICD-10-CM | POA: Diagnosis present

## 2021-10-04 DIAGNOSIS — K219 Gastro-esophageal reflux disease without esophagitis: Secondary | ICD-10-CM | POA: Diagnosis present

## 2021-10-04 DIAGNOSIS — Z9181 History of falling: Secondary | ICD-10-CM | POA: Diagnosis not present

## 2021-10-04 LAB — TROPONIN I (HIGH SENSITIVITY): Troponin I (High Sensitivity): 9 ng/L (ref <18)

## 2021-10-04 LAB — PROTIME-INR
INR: 1.7 — ABNORMAL HIGH (ref 0.8–1.2)
Prothrombin Time: 19.5 seconds — ABNORMAL HIGH (ref 11.4–15.2)

## 2021-10-04 MED ORDER — ONDANSETRON HCL 4 MG/2ML IJ SOLN: 4.0000 mg | Freq: Four times a day (QID) | INTRAMUSCULAR | Status: DC | PRN

## 2021-10-04 MED ORDER — WARFARIN SODIUM 2 MG PO TABS
2.0000 mg | ORAL_TABLET | ORAL | Status: AC
  Administered 2021-10-04: 2 mg via ORAL
  Filled 2021-10-04: qty 1

## 2021-10-04 MED ORDER — SODIUM CHLORIDE 0.9 % IV BOLUS
1000.0000 mL | Freq: Once | INTRAVENOUS | Status: AC
  Administered 2021-10-04: 1000 mL via INTRAVENOUS

## 2021-10-04 MED ORDER — AMLODIPINE BESYLATE 5 MG PO TABS
2.5000 mg | ORAL_TABLET | Freq: Every day | ORAL | Status: DC
  Administered 2021-10-04 – 2021-10-11 (×8): 2.5 mg via ORAL
  Filled 2021-10-04 (×8): qty 1

## 2021-10-04 MED ORDER — ATORVASTATIN CALCIUM 20 MG PO TABS
10.0000 mg | ORAL_TABLET | Freq: Every day | ORAL | Status: DC
  Administered 2021-10-04 – 2021-10-11 (×8): 10 mg via ORAL
  Filled 2021-10-04 (×8): qty 1

## 2021-10-04 MED ORDER — METOPROLOL TARTRATE 25 MG PO TABS
25.0000 mg | ORAL_TABLET | Freq: Two times a day (BID) | ORAL | Status: DC
  Administered 2021-10-04 – 2021-10-11 (×14): 25 mg via ORAL
  Filled 2021-10-04 (×15): qty 1

## 2021-10-04 MED ORDER — WARFARIN - PHARMACIST DOSING INPATIENT
Freq: Every day | Status: DC
  Filled 2021-10-04: qty 1

## 2021-10-04 MED ORDER — WARFARIN SODIUM 2 MG PO TABS
2.0000 mg | ORAL_TABLET | Freq: Once | ORAL | Status: AC
  Administered 2021-10-04: 2 mg via ORAL
  Filled 2021-10-04: qty 1

## 2021-10-04 MED ORDER — ONDANSETRON HCL 4 MG PO TABS: 4.0000 mg | ORAL_TABLET | Freq: Four times a day (QID) | ORAL | Status: DC | PRN

## 2021-10-04 MED ORDER — AMLODIPINE BESYLATE 5 MG PO TABS: 5.0000 mg | ORAL_TABLET | Freq: Every day | ORAL | Status: DC

## 2021-10-04 MED ORDER — HYDROCODONE-ACETAMINOPHEN 5-325 MG PO TABS
1.0000 | ORAL_TABLET | ORAL | Status: DC | PRN
  Filled 2021-10-04: qty 1

## 2021-10-04 MED ORDER — SODIUM CHLORIDE 0.9 % IV SOLN
1.0000 g | INTRAVENOUS | Status: AC
  Administered 2021-10-05 – 2021-10-09 (×5): 1 g via INTRAVENOUS
  Filled 2021-10-04: qty 10
  Filled 2021-10-04 (×2): qty 1
  Filled 2021-10-04 (×2): qty 10

## 2021-10-04 MED ORDER — SODIUM CHLORIDE 0.9 % IV SOLN
1.0000 g | Freq: Once | INTRAVENOUS | Status: AC
  Administered 2021-10-04: 1 g via INTRAVENOUS
  Filled 2021-10-04: qty 10

## 2021-10-04 MED ORDER — ACETAMINOPHEN 325 MG PO TABS
650.0000 mg | ORAL_TABLET | Freq: Four times a day (QID) | ORAL | Status: DC | PRN
  Administered 2021-10-06 – 2021-10-09 (×5): 650 mg via ORAL
  Filled 2021-10-04 (×6): qty 2

## 2021-10-04 MED ORDER — ACETAMINOPHEN 650 MG RE SUPP: 650.0000 mg | Freq: Four times a day (QID) | RECTAL | Status: DC | PRN

## 2021-10-04 NOTE — IPAL (Signed)
  Interdisciplinary Goals of Care Family Meeting   Date carried out: 10/04/2021  Location of the meeting: Phone conference  Member's involved: Physician and Family Member or next of kin  Durable Power of Attorney or acting medical decision maker: Staceyann Knouff, son   Discussion: We discussed goals of care for [employer] .    Code status: Full DNR  Disposition: Continue current acute care  Time spent for the meeting: [place], MD  10/04/2021, 3:02 AM

## 2021-10-04 NOTE — ED Notes (Signed)
Patient initially refused to take the Coumadin due, stating that no one is telling her what is going on. Patient refused to allow this writer to flush IV. This Probation officer called the pharmacist to question about the timing of the Coumadin and the patient was able to speak with the pharmacist and then agreed to take the medication. Hospitalist at bedside.

## 2021-10-04 NOTE — Assessment & Plan Note (Addendum)
Patient with increased somnolence over the past 3 weeks.  This is felt to be secondary to UTI and has improved.  However, patient continued to have some persistent delirium acutely confused during hospitalization than at her baseline.  Follow-up lab work unremarkable.  Felt to be combination of hospital induced delirium (from and away from her home environment) as well as not being on her home medications of Namenda and Remeron, which were restarted 10/2.  Patient since then improved

## 2021-10-04 NOTE — ED Notes (Signed)
Report received from [employer]

## 2021-10-04 NOTE — Assessment & Plan Note (Signed)
Continue amlodipine and metoprolol 

## 2021-10-04 NOTE — H&P (Signed)
History and Physical    Patient: Laura David WUJ:[id number] DOB: [birth year] DOA: 10/03/2021 DOS: the patient was seen and examined on 10/04/2021 PCP: [name], MD  Patient coming from: Home  Chief Complaint:  Chief Complaint  Patient presents with   Altered Mental Status    HPI: Laura David is a 87 y.o. female with medical history significant for Paroxysmal atrial fibrillation on warfarin, breast cancer, hypertension, mild cognitive impairment, who presents to the ED with concerns for confusion beyond her baseline. As well as a fall earlier in the day with pain to her right shoulder and coccyx.   Her son who gives most of the history says that  over the past three weeks she has been sleeping a lot sometimes from 6pm to 10am and this causes her to miss meals. Her neurologist discontinued her Lexapro at the visit on 09/24/21 and started her on mirtazapine but this did not make a difference. She has had no other complaints of nausea, vomiting, abdominal pain, diarrhea or dysuria, chest pain, shortness of breath or palpitations, headache or weakness. ED course and data review: BP 159/81 with otherwise normal vitals.  Labs showing normal CBC, CMP mostly unremarkable, troponin of 9, INR 1.6, urinalysis with positive nitrites and small leukocyte esterase and many bacteria. Trauma imaging of head right shoulder and sacrum and coccyx all nonacute. Patient given an NS 1 L bolus started on Rocephin and hospitalist consulted for admission.   Review of Systems: As mentioned in the history of present illness. All other systems reviewed and are negative.  Past Medical History:  Diagnosis Date   Asthma    Breast cancer ([place]) 2010   RIGHT   Cancer ([place])    basel cell, s/p MOH'S/DR. 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 She has never used smokeless tobacco. She reports that she does not currently use drugs. She reports that she does not drink alcohol.  Allergies  Allergen Reactions   Flagyl [Metronidazole] Nausea Only   Pacerone [Amiodarone]     Eye deposits   Ramipril Cough    Family History  Problem Relation Age of Onset   Heart disease Mother    Stroke Mother    Cancer Father        LUNG   Diabetes Brother    Heart disease Brother    Hyperlipidemia Brother    Hypertension Brother    Diabetes Brother    Heart disease Brother    Hyperlipidemia Brother    Hypertension Brother    Breast cancer Neg Hx     Prior to Admission  medications   Medication Sig Start Date End Date Taking? Authorizing Provider  alendronate (FOSAMAX) 70 MG tablet Take 70 mg by mouth once a week. (Sundays) 10/27/19   [provider]  amLODipine (NORVASC) 5 MG tablet Take 5 mg by mouth daily.    [provider]  atorvastatin (LIPITOR) 10 MG tablet Take 10 mg by mouth at bedtime. 11/30/19   [provider]  metoprolol tartrate (LOPRESSOR) 25 MG tablet Take 25 mg by mouth 2 (two) times daily.    [provider]  warfarin (COUMADIN) 2 MG tablet Take 2 mg by mouth daily. TAKE AS DIRECTED.    [provider]    Physical Exam: Vitals:   10/03/21 2009 10/03/21 2330 10/04/21 0000 10/04/21 0135   BP:  (!) 172/80 (!) 176/86 123/65  Pulse:  84 73 72  Resp:  14 17 (!) 21  Temp:      SpO2:  100% 100% 99%  Weight: 49.9 kg     Height: 5' (1.524 m)      Physical Exam Vitals and nursing note reviewed.  Constitutional:      General: She is not in acute distress. HENT:     Head: Normocephalic and atraumatic.  Cardiovascular:     Rate and Rhythm: Normal rate and regular rhythm.     Heart sounds: Normal heart sounds.  Pulmonary:     Effort: Pulmonary effort is normal.     Breath sounds: Normal breath sounds.  Abdominal:     Palpations: Abdomen is soft.     Tenderness: There is no abdominal tenderness.  Neurological:     Mental Status: Mental status is at baseline. She is disoriented.     Labs on Admission: I have personally reviewed following labs and imaging studies  CBC: Recent Labs  Lab 10/03/21 2013  WBC 7.5  NEUTROABS 4.7  HGB 14.7  HCT 45.7  MCV 93.1  PLT 939   Basic Metabolic Panel: Recent Labs  Lab 10/03/21 2013  NA 143  K 3.8  CL 102  CO2 32  GLUCOSE 113*  BUN 20  CREATININE 0.95  CALCIUM 10.7*   GFR: Estimated Creatinine Clearance: 31.1 mL/min (by C-G formula based on SCr of 0.95 mg/dL). Liver Function Tests: Recent Labs  Lab 10/03/21 2013  AST 30  ALT 17  ALKPHOS 77  BILITOT 1.0  PROT 8.1  ALBUMIN 4.5   No results for input(s): "LIPASE", "AMYLASE" in the last 168 hours. No results for input(s): "AMMONIA" in the last 168 hours. Coagulation Profile: Recent Labs  Lab 10/03/21 2013  INR 1.6*   Cardiac Enzymes: No results for input(s): "CKTOTAL", "CKMB", "CKMBINDEX", "TROPONINI" in the last 168 hours. BNP (last 3 results) No results for input(s): "PROBNP" in the last 8760 hours. HbA1C: No results for input(s): "HGBA1C" in the last 72 hours. CBG: No results for input(s): "GLUCAP" in the last 168 hours. Lipid Profile: No results for input(s): "CHOL", "HDL", "LDLCALC", "TRIG", "CHOLHDL", "LDLDIRECT" in the last 72 hours. Thyroid  Function Tests: No results for input(s): "TSH", "T4TOTAL", "FREET4", "T3FREE", "THYROIDAB" in the last 72 hours. Anemia Panel: No results for input(s): "VITAMINB12", "FOLATE", "FERRITIN", "TIBC", "IRON", "RETICCTPCT" in the last 72 hours. Urine analysis:    Component Value Date/Time   COLORURINE YELLOW (A) 10/03/2021 2335   APPEARANCEUR HAZY (A) 10/03/2021 2335   LABSPEC 1.018 10/03/2021 2335   PHURINE 5.0 10/03/2021 2335   GLUCOSEU NEGATIVE 10/03/2021 2335   HGBUR MODERATE (A) 10/03/2021 2335   BILIRUBINUR NEGATIVE  10/03/2021 [place] 10/03/2021 2335   PROTEINUR NEGATIVE 10/03/2021 2335   NITRITE POSITIVE (A) 10/03/2021 2335   LEUKOCYTESUR SMALL (A) 10/03/2021 2335    Radiological Exams on Admission: DG Sacrum/Coccyx  Result Date: 10/03/2021 CLINICAL DATA:  Coccyx pain after fall EXAM: SACRUM AND COCCYX - 2+ VIEW COMPARISON:  Radiograph 01/09/2020 FINDINGS: There is no evidence of fracture or other focal bone lesions. Demineralization. IMPRESSION: Negative. Electronically Signed   By: Placido Sou M.D.   On: 10/03/2021 20:56   DG Shoulder Right  Result Date: 10/03/2021 CLINICAL DATA:  Right shoulder pain after fall EXAM: RIGHT SHOULDER - 2+ VIEW COMPARISON:  Radiographs 01/09/2020 FINDINGS: No acute fracture or dislocation. Advanced hypertrophic degenerative arthritis in the right shoulder. IMPRESSION: No acute fracture or dislocation. Advanced right shoulder arthritis. Electronically Signed   By: Placido Sou M.D.   On: 10/03/2021 20:54   CT Head Wo Contrast  Result Date: 10/03/2021 CLINICAL DATA:  Head trauma, intracranial arterial injury suspected fall. Fall EXAM: CT HEAD WITHOUT CONTRAST TECHNIQUE: Contiguous axial images were obtained from the base of the skull through the vertex without intravenous contrast. RADIATION DOSE REDUCTION: This exam was performed according to the departmental dose-optimization program which includes automated exposure control,  adjustment of the mA and/or kV according to patient size and/or use of iterative reconstruction technique. COMPARISON:  08/13/2021 FINDINGS: Brain: There is atrophy and chronic small vessel disease changes. No acute intracranial abnormality. Specifically, no hemorrhage, hydrocephalus, mass lesion, acute infarction, or significant intracranial injury. Vascular: No hyperdense vessel or unexpected calcification. Skull: No acute calvarial abnormality. Sinuses/Orbits: No acute findings Other: None IMPRESSION: Atrophy, chronic microvascular disease. No acute intracranial abnormality. Electronically Signed   By: Rolm Baptise M.D.   On: 10/03/2021 20:35     Data Reviewed: Relevant notes from primary care and specialist visits, past discharge summaries as available in EHR, including Care Everywhere. Prior diagnostic testing as pertinent to current admission diagnoses Updated medications and problem lists for reconciliation ED course, including vitals, labs, imaging, treatment and response to treatment Triage notes, nursing and pharmacy notes and ED provider's notes Notable results as noted in HPI   Assessment and Plan: * Acute metabolic encephalopathy Patient with increased somnolence over the past 3 weeks Secondary to acute infection Neurologic checks with fall and aspiration precautions Treat UTI as outlined below  Urinary tract infection IV Rocephin and follow cultures  PAF (paroxysmal atrial fibrillation) (HCC) Subtherapeutic INR Continue Coumadin with pharmacy to manage Continue metoprolol  Dementia ([place]) Continue Remeron Delirium precautions --Get up during the day --Keep blinds open and lights on during daylight hours --Minimize the use of opioids/benzodiazepines --Reorient the patient frequently, provide easily visible clock and calendar --Provide sensory aids like glasses, hearing aids --Encourage ambulation, regular activities and visitors to maintain cognitive stimulation   --Patient would benefit from having family members at bedside to reinforce his orientation.   Fall at home, initial encounter History of falls with injury Ambulates with walker Son states patient does not use walker and she should and will hold onto things instead when she is walking No evidence of trauma on imaging Fall precautions PT and TOC consult  Hypertension Continue amlodipine and metoprolol        DVT prophylaxis: Coumadin  Consults: none  Advance Care Planning:   Code Status: Prior   Family Communication: son  Disposition Plan: Back to previous home environment  Severity of Illness: The appropriate patient status for this patient is OBSERVATION. Observation status  is judged to be reasonable and necessary in order to provide the required intensity of service to ensure the patient's safety. The patient's presenting symptoms, physical exam findings, and initial radiographic and laboratory data in the context of their medical condition is felt to place them at decreased risk for further clinical deterioration. Furthermore, it is anticipated that the patient will be medically stable for discharge from the hospital within 2 midnights of admission.   Author: Athena Masse, MD 10/04/2021 2:31 AM  For on call review [email or web address].

## 2021-10-04 NOTE — Hospital Course (Signed)
Laura David is a 87 y.o. female with medical history significant for Paroxysmal atrial fibrillation on warfarin, breast cancer, hypertension, mild cognitive impairment, who presents to the ED with concerns for confusion beyond her baseline.  Patient has abnormal urine, urine culture was sent out, she is diagnosed with UTI, started on Rocephin.

## 2021-10-04 NOTE — Plan of Care (Signed)

## 2021-10-04 NOTE — ED Notes (Signed)
Dr. Duncan at bedside 

## 2021-10-04 NOTE — Progress Notes (Addendum)
ANTICOAGULATION CONSULT NOTE - Initial Consult  Pharmacy Consult for Warfarin  Indication: atrial fibrillation - HCC   Allergies  Allergen Reactions   Flagyl [Metronidazole] Nausea Only   Pacerone [Amiodarone]     Eye deposits   Ramipril Cough    Patient Measurements: Height: 5' (152.4 cm) Weight: 49.9 kg (110 lb) IBW/kg (Calculated) : 45.5 Heparin Dosing Weight:   Vital Signs: Temp: 97.8 F (36.6 C) (09/28 2008) BP: 123/65 (09/29 0135) Pulse Rate: 72 (09/29 0135)  Labs: Recent Labs    10/03/21 2013 10/04/21 0012  HGB 14.7  --   HCT 45.7  --   PLT 270  --   LABPROT 18.9*  --   INR 1.6*  --   CREATININE 0.95  --   TROPONINIHS 8 9    Estimated Creatinine Clearance: 31.1 mL/min (by C-G formula based on SCr of 0.95 mg/dL).   Medical History: Past Medical History:  Diagnosis Date   Asthma    Breast cancer ([place]) 2010   RIGHT   Cancer ([place])    basel cell, s/p MOH'S/DR. 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   Pt was on warfarin 2 mg PO daily at home.  Pt is confused so cannot answer when she last had warfarin.   PMH:  Htn, Breast Cancer ([place]), CHADS-Vasc Score : 2 (high risk)   9/28:  INR @ 2013 = 1.6   Goal of Therapy:  INR 2-3   Plan:  Will order warfarin 2 mg PO X 1 STAT.  - spoke with MD regarding possibility of bridging with heparin.  MD feels not necessary since it is for Afib not DVT.   Will check INR daily.   Ikeya Brockel D 10/04/2021,2:42 AM

## 2021-10-04 NOTE — Progress Notes (Addendum)
[place] for Warfarin  Indication: atrial fibrillation   Allergies  Allergen Reactions   Flagyl [Metronidazole] Nausea Only   Pacerone [Amiodarone]     Eye deposits   Ramipril Cough    Patient Measurements: Height: 5' (152.4 cm) Weight: 49.9 kg (110 lb) IBW/kg (Calculated) : 45.5  Vital Signs: Temp: 98.2 F (36.8 C) (09/29 0349) BP: 172/85 (09/29 0600) Pulse Rate: 79 (09/29 0600)  Labs: Recent Labs    10/03/21 2013 10/04/21 0012 10/04/21 0558  HGB 14.7  --   --   HCT 45.7  --   --   PLT 270  --   --   LABPROT 18.9*  --  19.5*  INR 1.6*  --  1.7*  CREATININE 0.95  --   --   TROPONINIHS 8 9  --      Estimated Creatinine Clearance: 31.1 mL/min (by C-G formula based on SCr of 0.95 mg/dL).   Medical History: Past Medical History:  Diagnosis Date   Asthma    Breast cancer ([place]) 2010   RIGHT   Cancer ([place])    basel cell, s/p MOH'S/DR. 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   Pt is confused so cannot answer when she last had warfarin.   home warfarin dose: 2 MG tablet every Sunday/Wednesday and 1 mg all other days  Goal of Therapy:  INR 2-3   Plan:  Will order warfarin 2 mg PO X 1  Will check INR daily.   Dallie Piles 10/04/2021,7:10 AM

## 2021-10-04 NOTE — Assessment & Plan Note (Addendum)
History of falls with injury Ambulates with walker Son states patient does not use walker and she should and will hold onto things instead when she is walking.  Seen by PT and OT who are recommending home health.  No evidence of trauma on imaging however patient complaining of right shoulder pain.  See below.

## 2021-10-04 NOTE — ED Notes (Signed)
Pt called and stated she had urinated on herself, to this point pt had been calling and being assisted to the toilet. Linens were changed and pt changed into a gown. Pt given new warm blankets.

## 2021-10-04 NOTE — ED Notes (Signed)
ED Provider at bedside. 

## 2021-10-04 NOTE — Assessment & Plan Note (Addendum)
Continue Remeron Delirium precautions --Get up during the day --Keep blinds open and lights on during daylight hours --Minimize the use of opioids/benzodiazepines --Reorient the patient frequently, provide easily visible clock and calendar --Provide sensory aids like glasses, hearing aids --Encourage ambulation, regular activities and visitors to maintain cognitive stimulation  --Patient would benefit from having family members at bedside to reinforce his orientation.

## 2021-10-04 NOTE — ED Notes (Signed)
Patient is aware she is going to the floor. Patient is calm and cooperative, smiling. NAD.

## 2021-10-04 NOTE — ED Provider Notes (Signed)
[facility] Provider Note    Event Date/Time   First MD Initiated Contact with Patient 10/03/21 2329     (approximate)   History   Altered Mental Status   HPI  Laura David is a 87 y.o. female who presents to the ED for evaluation of Altered Mental Status   I reviewed neurology clinic visit from 9/19 where patient was evaluated for worsening memory loss and.  She presents with her son who provides majority of history as patient is disoriented.  Son reports that she has been progressively worsening for a few months and is concerned about dementia.  She lives at home by herself and he and his wife help out as much as they can.  Son checked on the patient today and she was noted to be clearly abnormal.  She reports a fall and he reports that it looks like she did fall in the house today.  She is complaining of right shoulder and buttocks pain.   Physical Exam   Triage Vital Signs: ED Triage Vitals  Enc Vitals Group     BP 10/03/21 2008 (!) 159/81     Pulse Rate 10/03/21 2008 82     Resp 10/03/21 2008 19     Temp 10/03/21 2008 97.8 F (36.6 C)     Temp src --      SpO2 10/03/21 2008 99 %     Weight 10/03/21 2009 110 lb (49.9 kg)     Height 10/03/21 2009 5' (1.524 m)     Head Circumference --      Peak Flow --      Pain Score 10/03/21 2009 4     Pain Loc --      Pain Edu? --      Excl. in [place]? --     Most recent vital signs: Vitals:   10/04/21 0000 10/04/21 0135  BP: (!) 176/86 123/65  Pulse: 73 72  Resp: 17 (!) 21  Temp:    SpO2: 100% 99%    General: Awake, no distress.  Pleasantly disoriented. CV:  Good peripheral perfusion.  Resp:  Normal effort.  Abd:  No distention.  Suprapubic tenderness is present.  Otherwise benign.  No peritoneal features. MSK:  No deformity noted.  No external signs of trauma, bruising, laceration or hematoma appreciated. Neuro:  No focal deficits appreciated. Cranial nerves II through XII intact 5/5  strength and sensation in all 4 extremities Other:     ED Results / Procedures / Treatments   Labs (all labs ordered are listed, but only abnormal results are displayed) Labs Reviewed  COMPREHENSIVE METABOLIC PANEL - Abnormal; Notable for the following components:      Result Value   Glucose, Bld 113 (*)    Calcium 10.7 (*)    GFR, Estimated 59 (*)    All other components within normal limits  PROTIME-INR - Abnormal; Notable for the following components:   Prothrombin Time 18.9 (*)    INR 1.6 (*)    All other components within normal limits  URINALYSIS, ROUTINE W REFLEX MICROSCOPIC - Abnormal; Notable for the following components:   Color, Urine YELLOW (*)    APPearance HAZY (*)    Hgb urine dipstick MODERATE (*)    Nitrite POSITIVE (*)    Leukocytes,Ua SMALL (*)    Bacteria, UA MANY (*)    All other components within normal limits  URINE CULTURE  CBC WITH DIFFERENTIAL/PLATELET  TROPONIN I (HIGH SENSITIVITY)  TROPONIN I (HIGH SENSITIVITY)    EKG A-fib with a rate of 87 bpm.  Nonspecific ST changes inferiorly and laterally without STEMI.  RADIOLOGY CT head interpreted by me without evidence of acute intracranial pathology Plain film of the sacrum interpreted by me without clear fracture. Plain film the right shoulder interpreted by me without evidence of fracture or dislocation  Official radiology report(s): DG Sacrum/Coccyx  Result Date: 10/03/2021 CLINICAL DATA:  Coccyx pain after fall EXAM: SACRUM AND COCCYX - 2+ VIEW COMPARISON:  Radiograph 01/09/2020 FINDINGS: There is no evidence of fracture or other focal bone lesions. Demineralization. IMPRESSION: Negative. Electronically Signed   By: Placido Sou M.D.   On: 10/03/2021 20:56   DG Shoulder Right  Result Date: 10/03/2021 CLINICAL DATA:  Right shoulder pain after fall EXAM: RIGHT SHOULDER - 2+ VIEW COMPARISON:  Radiographs 01/09/2020 FINDINGS: No acute fracture or dislocation. Advanced hypertrophic degenerative  arthritis in the right shoulder. IMPRESSION: No acute fracture or dislocation. Advanced right shoulder arthritis. Electronically Signed   By: Placido Sou M.D.   On: 10/03/2021 20:54   CT Head Wo Contrast  Result Date: 10/03/2021 CLINICAL DATA:  Head trauma, intracranial arterial injury suspected fall. Fall EXAM: CT HEAD WITHOUT CONTRAST TECHNIQUE: Contiguous axial images were obtained from the base of the skull through the vertex without intravenous contrast. RADIATION DOSE REDUCTION: This exam was performed according to the departmental dose-optimization program which includes automated exposure control, adjustment of the mA and/or kV according to patient size and/or use of iterative reconstruction technique. COMPARISON:  08/13/2021 FINDINGS: Brain: There is atrophy and chronic small vessel disease changes. No acute intracranial abnormality. Specifically, no hemorrhage, hydrocephalus, mass lesion, acute infarction, or significant intracranial injury. Vascular: No hyperdense vessel or unexpected calcification. Skull: No acute calvarial abnormality. Sinuses/Orbits: No acute findings Other: None IMPRESSION: Atrophy, chronic microvascular disease. No acute intracranial abnormality. Electronically Signed   By: Rolm Baptise M.D.   On: 10/03/2021 20:35    PROCEDURES and INTERVENTIONS:  .1-3 Lead EKG Interpretation  Performed by: Vladimir Crofts, MD Authorized by: Vladimir Crofts, MD     Interpretation: normal     ECG rate:  68   ECG rate assessment: normal     Rhythm: sinus rhythm     Ectopy: none     Conduction: normal     Medications  atorvastatin (LIPITOR) tablet 10 mg (has no administration in time range)  metoprolol tartrate (LOPRESSOR) tablet 25 mg (has no administration in time range)  amLODipine (NORVASC) tablet 5 mg (has no administration in time range)  cefTRIAXone (ROCEPHIN) 1 g in sodium chloride 0.9 % 100 mL IVPB (has no administration in time range)  acetaminophen (TYLENOL) tablet 650  mg (has no administration in time range)    Or  acetaminophen (TYLENOL) suppository 650 mg (has no administration in time range)  ondansetron (ZOFRAN) tablet 4 mg (has no administration in time range)    Or  ondansetron (ZOFRAN) injection 4 mg (has no administration in time range)  HYDROcodone-acetaminophen (NORCO/VICODIN) 5-325 MG per tablet 1-2 tablet (has no administration in time range)  sodium chloride 0.9 % bolus 1,000 mL (1,000 mLs Intravenous New Bag/Given 10/04/21 0207)  cefTRIAXone (ROCEPHIN) 1 g in sodium chloride 0.9 % 100 mL IVPB (1 g Intravenous New Bag/Given 10/04/21 0207)     IMPRESSION / MDM / ASSESSMENT AND PLAN / ED COURSE  I reviewed the triage vital signs and the nursing notes.  Differential diagnosis includes, but is not limited to, stroke, intracranial hemorrhage,  seizure, metabolic encephalopathy, sepsis, dehydration, spinal fracture  {Patient presents with symptoms of an acute illness or injury that is potentially life-threatening.  87 year old woman presents to the ED with evidence of metabolic encephalopathy from acute cystitis requiring medical admission.  No signs of sepsis.  Blood work is generally reassuring with normal CBC and essentially normal metabolic panel.  Urine with infectious features and will be sent for culture.  Alongside her confusion and suprapubic tenderness I am concerned about cystitis.  She was started on ceftriaxone.  Negative troponins and reassuring imaging.  We will consult with medicine for admission.      FINAL CLINICAL IMPRESSION(S) / ED DIAGNOSES   Final diagnoses:  Altered mental status, unspecified altered mental status type  Metabolic encephalopathy  Acute cystitis without hematuria     Rx / DC Orders   ED Discharge Orders     None        Note:  This document was prepared using Dragon voice recognition software and may include unintentional dictation errors.   Vladimir Crofts, MD 10/04/21 [phone]

## 2021-10-04 NOTE — Assessment & Plan Note (Signed)
Subtherapeutic INR Continue Coumadin with pharmacy to manage Continue metoprolol

## 2021-10-04 NOTE — ED Notes (Signed)
Pulse ox is incorrect. Patient is 100% on room air.

## 2021-10-04 NOTE — Progress Notes (Signed)
  Progress Note   Patient: Laura David WKM:[id number] DOB: 1934-12-01 DOA: 10/03/2021     0 DOS: the patient was seen and examined on 10/04/2021   Brief hospital course: Laura David is a 87 y.o. female with medical history significant for Paroxysmal atrial fibrillation on warfarin, breast cancer, hypertension, mild cognitive impairment, who presents to the ED with concerns for confusion beyond her baseline.  Patient has abnormal urine, urine culture was sent out, she is diagnosed with UTI, started on Rocephin.  Assessment and Plan: * Acute metabolic encephalopathy Urinary tract infection Dementia ([place]) Patient has altered mental status associated with UTI.  Mental status seems to be improving, continue Rocephin, pending urine culture results. Obtain PT/OT.  PAF (paroxysmal atrial fibrillation) ([place]) Warfarin per pharmacy. Heart rate under control.  Fall at home, initial encounter History of falls with injury Ambulates with walker PT/OT.  Hypertension Continue amlodipine and metoprolol       Subjective:  Patient has some baseline confusion, no agitation. No abdominal pain nausea vomiting.  Physical Exam: Vitals:   10/04/21 0800 10/04/21 0912 10/04/21 1000 10/04/21 1204  BP: (!) 163/87 138/71 135/85   Pulse:  97 60   Resp: '15 17 19   '$ Temp:    (!) 97.3 F (36.3 C)  TempSrc:    Oral  SpO2:  99% 97%   Weight:      Height:       General exam: Appears calm and comfortable  Respiratory system: Clear to auscultation. Respiratory effort normal. Cardiovascular system: Relatively regular. No JVD, murmurs, rubs, gallops or clicks. No pedal edema. Gastrointestinal system: Abdomen is nondistended, soft and nontender. No organomegaly or masses felt. Normal bowel sounds heard. Central nervous system: Alert and oriented x2. No focal neurological deficits. Extremities: Symmetric 5 x 5 power. Skin: No rashes, lesions or ulcers Psychiatry: Mood & affect appropriate.   Data  Reviewed:  Lab results reviewed.  Family Communication:   Disposition: Status is: Inpatient Remains inpatient appropriate because: Severity of disease, IV treatment.  Planned Discharge Destination:  TBD    Time spent: No charge minutes  Author: Sharen Hones, MD 10/04/2021 4:11 PM  For on call review [email or web address].

## 2021-10-04 NOTE — Assessment & Plan Note (Signed)
Pansensitive E. coli.  Final dose of IV Rocephin due early morning 10/4.

## 2021-10-05 DIAGNOSIS — B9620 Unspecified Escherichia coli [E. coli] as the cause of diseases classified elsewhere: Secondary | ICD-10-CM

## 2021-10-05 DIAGNOSIS — N390 Urinary tract infection, site not specified: Secondary | ICD-10-CM | POA: Diagnosis not present

## 2021-10-05 DIAGNOSIS — I480 Paroxysmal atrial fibrillation: Secondary | ICD-10-CM

## 2021-10-05 DIAGNOSIS — G9341 Metabolic encephalopathy: Secondary | ICD-10-CM

## 2021-10-05 LAB — PROTIME-INR
INR: 1.9 — ABNORMAL HIGH (ref 0.8–1.2)
Prothrombin Time: 21.2 seconds — ABNORMAL HIGH (ref 11.4–15.2)

## 2021-10-05 IMAGING — CR DG SHOULDER 2+V*L*
3 series · 3 of 3 positions shown · non-contrast
Comparison: None.

CLINICAL DATA: Slipped and fell landing on the left side. Left
shoulder pain.

EXAM:
LEFT SHOULDER - 2+ VIEW

[shoulder grashey (1 of 2)]
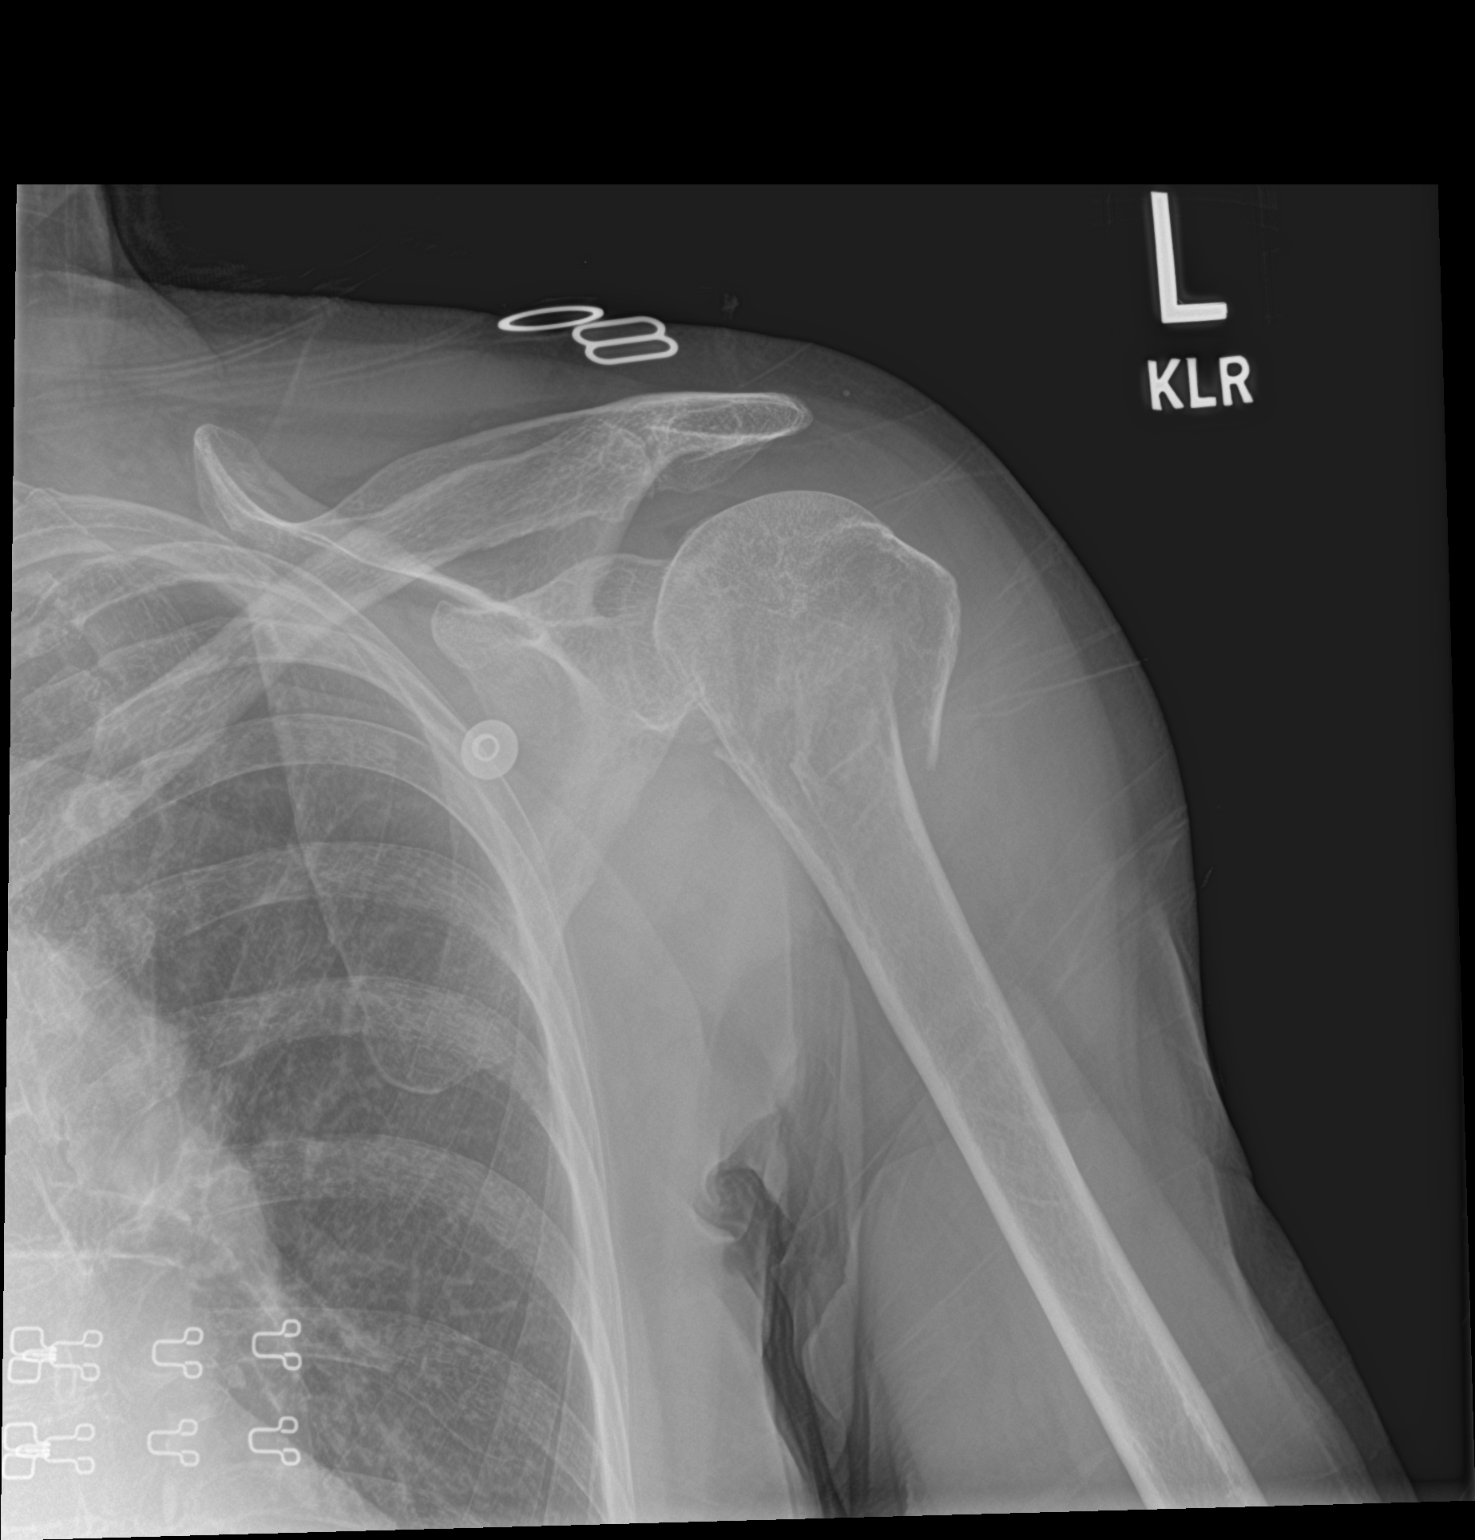

[shoulder y view]
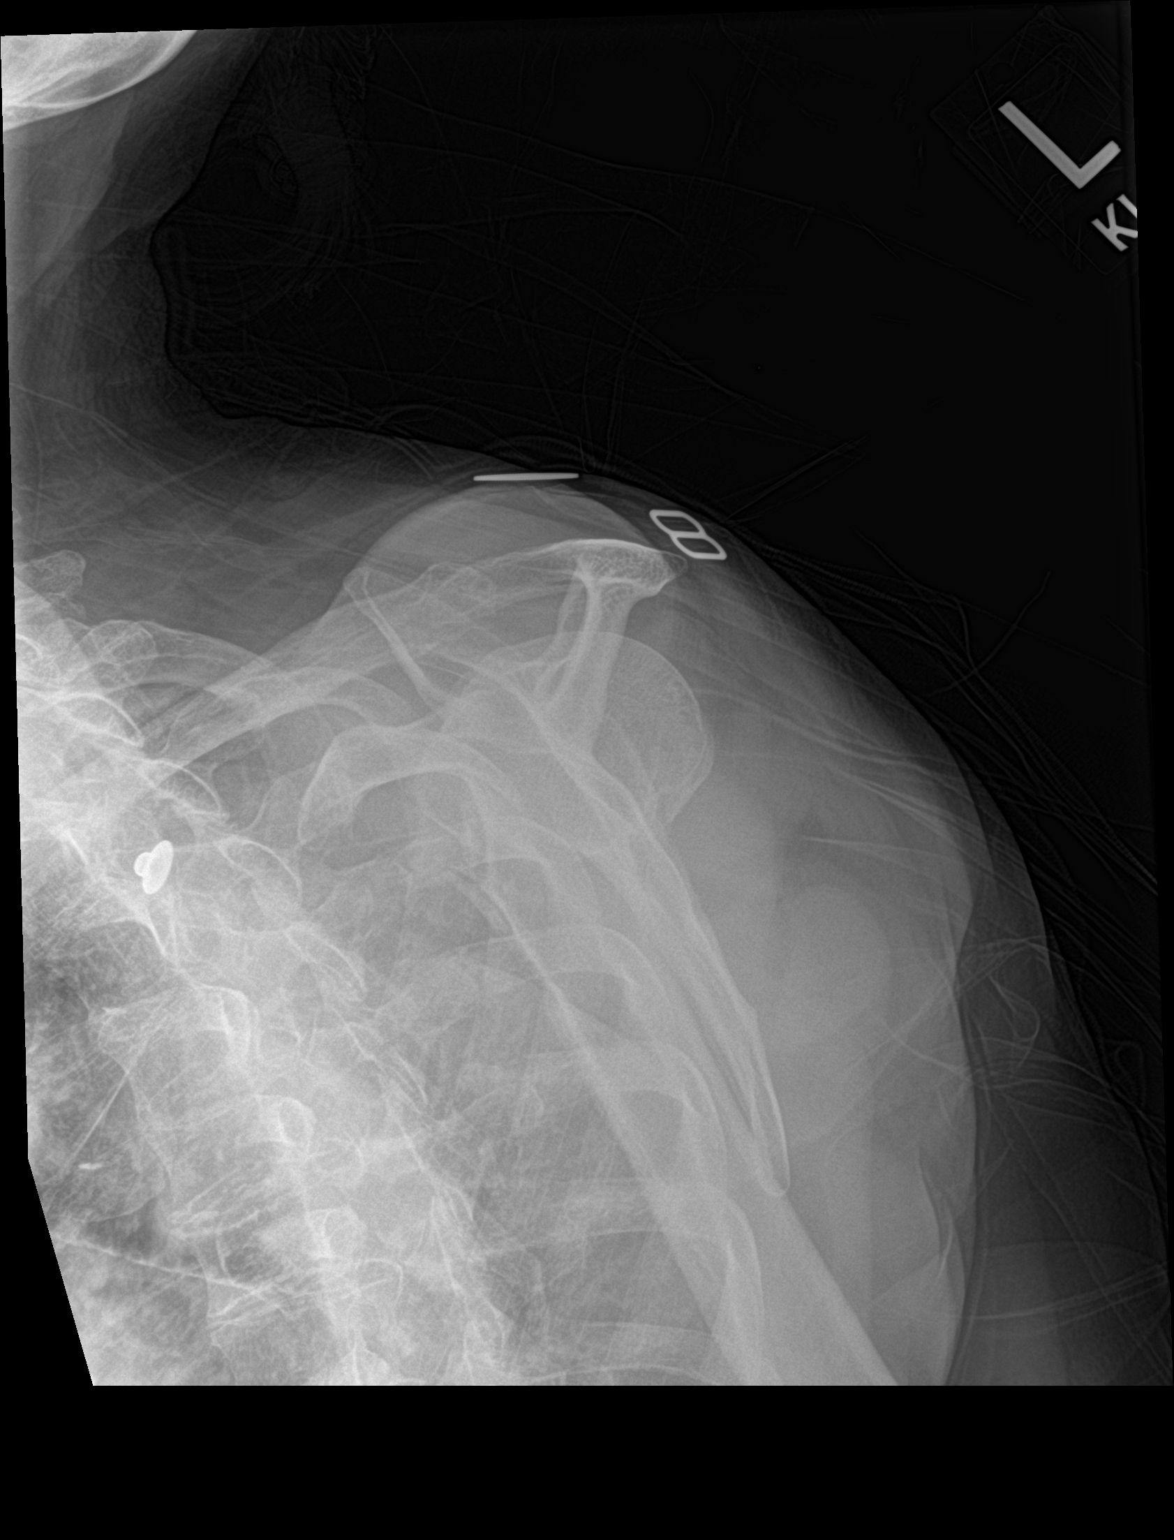

[shoulder grashey (2 of 2)]
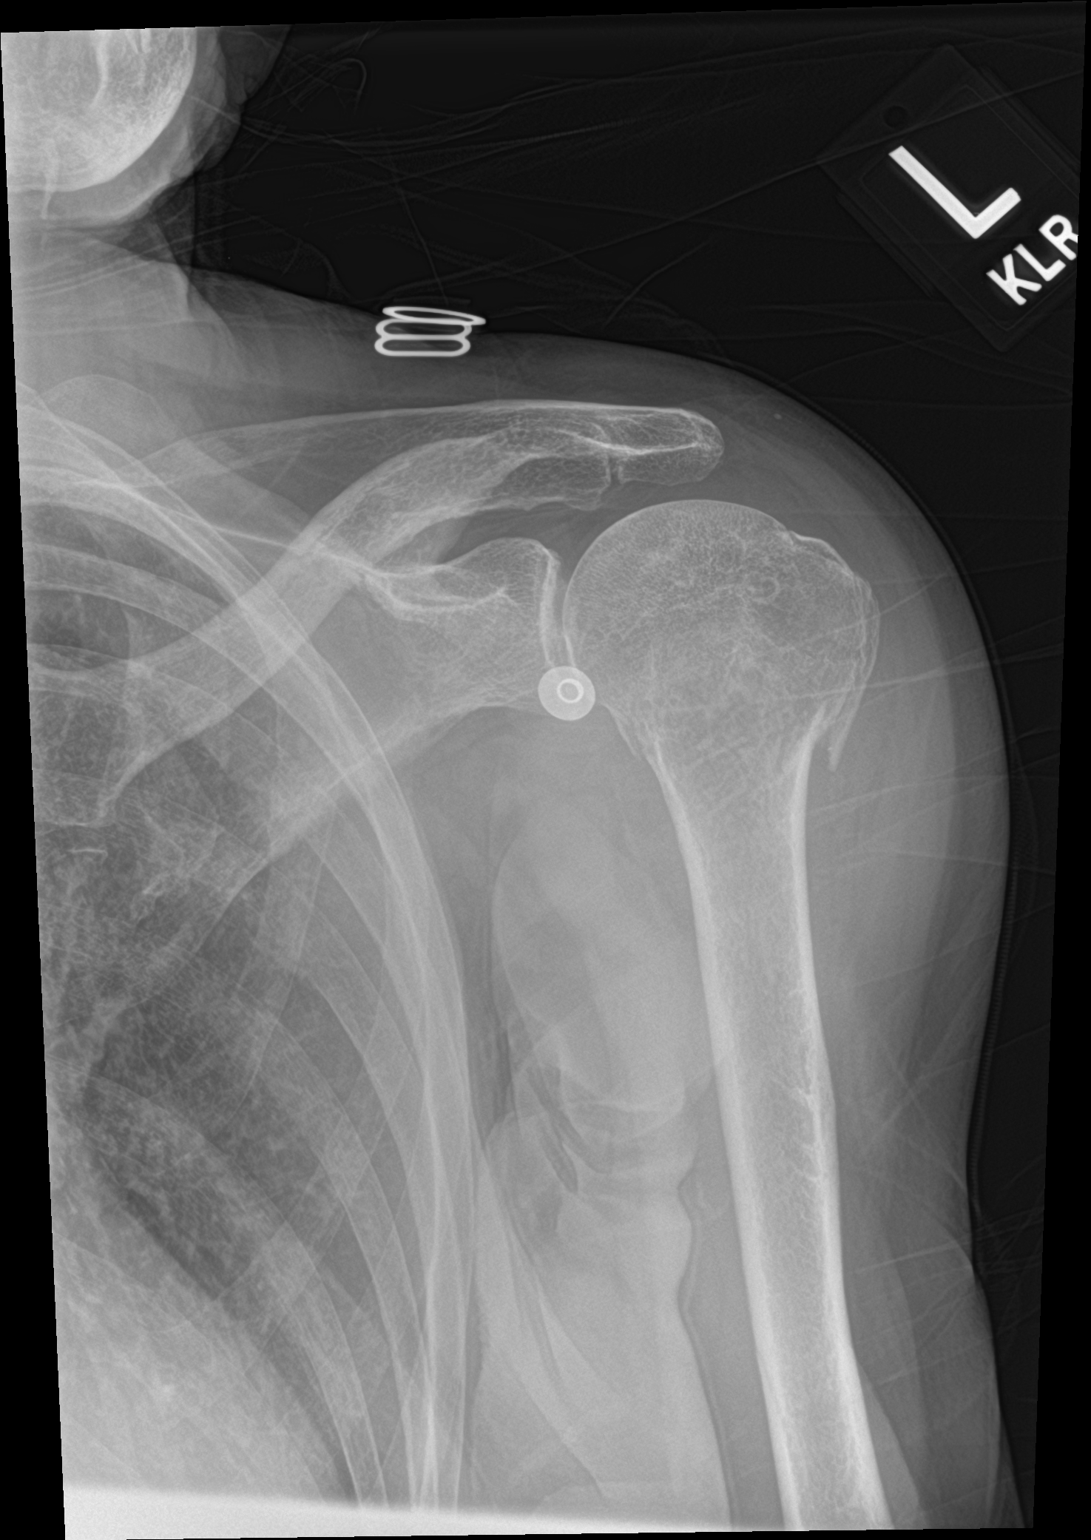

[3 of 3 positions shown; findings below may reference images not displayed]

FINDINGS: Comminuted fracture of the proximal humerus. There is a transverse
fracture across the surgical neck with additional fracture across
the base of the greater tuberosity. Tuberosity fracture component is
mildly displaced, 5-6 mm laterally. There is no significant fracture
angulation. Glenohumeral joint remains normally aligned. AC joint
normally spaced and aligned.

Skeletal structures are demineralized.
IMPRESSION: 1. Mildly comminuted fracture of the proximal left humerus as
described. No dislocation.

## 2021-10-05 MED ORDER — MELATONIN 5 MG PO TABS
5.0000 mg | ORAL_TABLET | Freq: Once | ORAL | Status: DC
  Filled 2021-10-05: qty 1

## 2021-10-05 MED ORDER — WARFARIN SODIUM 2 MG PO TABS
2.0000 mg | ORAL_TABLET | Freq: Once | ORAL | Status: AC
  Administered 2021-10-05: 2 mg via ORAL
  Filled 2021-10-05: qty 1

## 2021-10-05 NOTE — Progress Notes (Signed)
Mobility Specialist - Progress Note   10/05/21 0842  Mobility  Activity Ambulated with assistance in hallway;Stood at bedside;Dangled on edge of bed  Level of Assistance Contact guard assist, steadying assist  Assistive Device Front wheel walker  Distance Ambulated (ft) 200 ft  Activity Response Tolerated well  $Mobility charge 1 Mobility   Pt supine in bed on RA upon arrival. Pt scoots to EOB indep with extra time. Pt needs MinA to don shoes. Pt STS and ambulates in hallway CGA-SBA. Pt has no LOB noted. Pt returns to bed with needs in reach and bed alarm set.   Gretchen Short  Mobility Specialist  10/05/21 8:44 AM

## 2021-10-05 NOTE — Evaluation (Signed)
Physical Therapy Evaluation Patient Details Name: Laura David MRN: [id number] DOB: 06-May-1934 Today's Date: 10/05/2021  History of Present Illness  Laura David is a 87 y.o. female with medical history significant for Paroxysmal atrial fibrillation on warfarin, breast cancer, hypertension, mild cognitive impairment, who presents to the ED with concerns for confusion beyond her baseline. As well as a fall earlier in the day with pain to her right shoulder and coccyx    Clinical Impression  Pt received in Semi-Fowler's position and agreeable to therapy.  Pt has sitter in room due to pt constantly wanting to get out of bed to walk.  Pt pleasantly confused at time of evaluation and stating that it is night time and it is snowing outside.  Pt very pleasant throughout entire treatment and tolerates ambulation around the nursing station and down the main hallway to see outside the windows.  Pt tends to walk along the R side of the walker, at times kicking the hind legs of the RW because she drifts that direction.  Pt given verbal cues for staying within the walker and will perform for ~40 ft before getting away from it again.  Pt transitioned back to bed with all needs met and call bell within reach, bed alarm set.  Current discharge plans to home with HHPT are most appropriate at this time.  Pt will continue to benefit from skilled therapy in order to address deficits listed below.        Recommendations for follow up therapy are one component of a multi-disciplinary discharge planning process, led by the attending physician.  Recommendations may be updated based on patient status, additional functional criteria and insurance authorization.  Follow Up Recommendations Home health PT      Assistance Recommended at Discharge PRN  Patient can return home with the following  A little help with walking and/or transfers;A little help with bathing/dressing/bathroom    Equipment Recommendations None  recommended by PT  Recommendations for Other Services       Functional Status Assessment Patient has had a recent decline in their functional status and demonstrates the ability to make significant improvements in function in a reasonable and predictable amount of time.     Precautions / Restrictions Precautions Precautions: Fall Restrictions Weight Bearing Restrictions: No      Mobility  Bed Mobility Overal bed mobility: Modified Independent                  Transfers Overall transfer level: Needs assistance Equipment used: Rolling walker (2 wheels) Transfers: Sit to/from Stand Sit to Stand: Min guard                Ambulation/Gait Ambulation/Gait assistance: Supervision Gait Distance (Feet): 340 Feet Assistive device: Rolling walker (2 wheels) Gait Pattern/deviations: Step-through pattern, Drifts right/left, Trunk flexed Gait velocity: decreased, specifically with increased distances, she tends to fatigue and have less foot clearance.     General Gait Details: Pt ambualtes well, however tends to go to the R side of the walker when ambulating.  Stairs            Wheelchair Mobility    Modified Rankin (Stroke Patients Only)       Balance Overall balance assessment: Needs assistance Sitting-balance support: No upper extremity supported, Feet supported Sitting balance-Leahy Scale: Good     Standing balance support: No upper extremity supported, During functional activity Standing balance-Leahy Scale: Fair  Pertinent Vitals/Pain Pain Assessment Pain Assessment: No/denies pain    Home Living Family/patient expects to be discharged to:: Private residence Living Arrangements: Alone Available Help at Discharge: Family;Available PRN/intermittently Type of Home: House (condo) Home Access: Stairs to enter Entrance Stairs-Rails: Can reach both Entrance Stairs-Number of Steps: 3   Home Layout: One  level Home Equipment: Conservation officer, nature (2 wheels);Rollator (4 wheels);Shower seat      Prior Function Prior Level of Function : Independent/Modified Independent;History of Falls (last six months)             Mobility Comments: reports 3 falls in last 3 months ADLs Comments: assist for IADLs, son organizes medication however pt frequently forgets to take it. assist for meals. DiL assists with showers     Hand Dominance   Dominant Hand: Right    Extremity/Trunk Assessment   Upper Extremity Assessment Upper Extremity Assessment: Overall WFL for tasks assessed    Lower Extremity Assessment Lower Extremity Assessment: Generalized weakness       Communication   Communication: No difficulties  Cognition Arousal/Alertness: Awake/alert Behavior During Therapy: WFL for tasks assessed/performed, Impulsive Overall Cognitive Status: History of cognitive impairments - at baseline                                 General Comments: reprots she enjoys her rollator at home much more, but is able to safely navigate with use of the RW.        General Comments      Exercises     Assessment/Plan    PT Assessment Patient needs continued PT services  PT Problem List Decreased strength;Decreased activity tolerance;Decreased balance;Decreased mobility;Decreased knowledge of use of DME       PT Treatment Interventions DME instruction;Gait training    PT Goals (Current goals can be found in the Care Plan section)  Acute Rehab PT Goals Patient Stated Goal: to get back home. PT Goal Formulation: With patient Time For Goal Achievement: 10/19/21 Potential to Achieve Goals: Good    Frequency Min 2X/week     Co-evaluation               AM-PAC PT "6 Clicks" Mobility  Outcome Measure Help needed turning from your back to your side while in a flat bed without using bedrails?: None Help needed moving from lying on your back to sitting on the side of a flat bed  without using bedrails?: None Help needed moving to and from a bed to a chair (including a wheelchair)?: A Little Help needed standing up from a chair using your arms (e.g., wheelchair or bedside chair)?: None Help needed to walk in hospital room?: A Little Help needed climbing 3-5 steps with a railing? : A Little 6 Click Score: 21    End of Session Equipment Utilized During Treatment: Gait belt Activity Tolerance: Patient tolerated treatment well Patient left: in bed;with call bell/phone within reach;with bed alarm set Nurse Communication: Mobility status PT Visit Diagnosis: Unsteadiness on feet (R26.81);Other abnormalities of gait and mobility (R26.89);Repeated falls (R29.6);Muscle weakness (generalized) (M62.81);History of falling (Z91.81);Difficulty in walking, not elsewhere classified (R26.2)    Time: 4193-7902 PT Time Calculation (min) (ACUTE ONLY): 24 min   Charges:   PT Evaluation $PT Eval Low Complexity: 1 Low PT Treatments $Gait Training: 8-22 mins        Gwenlyn Saran, PT, DPT 10/05/21, 12:50 PM

## 2021-10-05 NOTE — Progress Notes (Signed)
ANTICOAGULATION CONSULT NOTE  Pharmacy Consult for Warfarin  Indication: atrial fibrillation   Allergies  Allergen Reactions   Flagyl [Metronidazole] Nausea Only   Pacerone [Amiodarone]     Eye deposits   Ramipril Cough    Patient Measurements: Height: 5' (152.4 cm) Weight: 49.9 kg (110 lb) IBW/kg (Calculated) : 45.5  Vital Signs: Temp: 98 F (36.7 C) (09/30 0819) Temp Source: Oral (09/30 0506) BP: 164/95 (09/30 0819) Pulse Rate: 89 (09/30 0819)  Labs: Recent Labs    10/03/21 2013 10/04/21 0012 10/04/21 0558 10/05/21 0649  HGB 14.7  --   --   --   HCT 45.7  --   --   --   PLT 270  --   --   --   LABPROT 18.9*  --  19.5* 21.2*  INR 1.6*  --  1.7* 1.9*  CREATININE 0.95  --   --   --   TROPONINIHS 8 9  --   --      Estimated Creatinine Clearance: 31.1 mL/min (by C-G formula based on SCr of 0.95 mg/dL).   Medical History: Past Medical History:  Diagnosis Date   Asthma    Breast cancer ([place]) 2010   RIGHT   Cancer ([place])    basel cell, s/p MOH'S/DR. 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   Pt is confused so cannot answer when she last had warfarin.   home warfarin dose: 2 MG tablet every Sunday/Wednesday and 1 mg all other days  Goal of Therapy:  INR 2-3  Date INR Dose 9/29 1.7 '2mg'$  9/30 1.9    Plan:  Will order warfarin 2 mg PO X 1  Will check INR daily.   Laura David Laura David 10/05/2021,10:37 AM

## 2021-10-05 NOTE — Evaluation (Signed)
Occupational Therapy Evaluation Patient Details Name: Laura David MRN: [id number] DOB: 1934/02/05 Today's Date: 10/05/2021   History of Present Illness Laura David is a 87 y.o. female with medical history significant for Paroxysmal atrial fibrillation on warfarin, breast cancer, hypertension, mild cognitive impairment, who presents to the ED with concerns for confusion beyond her baseline. As well as a fall earlier in the day with pain to her right shoulder and coccyx   Clinical Impression   Laura David was seen for OT evaluation this date. Prior to hospital admission, pt was MOD I for mobility using RW PRN (pt reports all the time, daughter in law states frequent furniture walker). Pt lives alone in [place] with 3 STE and B rails, assist for IADLs. Pt presents to acute OT demonstrating impaired ADL performance and functional mobility 2/2 poor safety awareness and functional balance deficits. Pt answers orientation questions approriately however unable to state day of week or recall after being told.   Pt currently requires SETUP don B shoes seated EOB. SBA grooming standing sink side. SBA + RW for funcitonal mobility, tolerates ~200 ft, cues for safe RW use. Pt would benefit from skilled OT to address noted impairments and functional limitations (see below for any additional details). Upon hospital discharge, recommend HHOT to maximize pt safety and return to PLOF.    Recommendations for follow up therapy are one component of a multi-disciplinary discharge planning process, led by the attending physician.  Recommendations may be updated based on patient status, additional functional criteria and insurance authorization.   Follow Up Recommendations  Home health OT    Assistance Recommended at Discharge Intermittent Supervision/Assistance  Patient can return home with the following Assistance with cooking/housework;Direct supervision/assist for medications management;Help with stairs or ramp for  entrance    Functional Status Assessment  Patient has had a recent decline in their functional status and demonstrates the ability to make significant improvements in function in a reasonable and predictable amount of time.  Equipment Recommendations  BSC/3in1    Recommendations for Other Services       Precautions / Restrictions Precautions Precautions: Fall Restrictions Weight Bearing Restrictions: No      Mobility Bed Mobility Overal bed mobility: Modified Independent                  Transfers Overall transfer level: Needs assistance Equipment used: Rolling walker (2 wheels) Transfers: Sit to/from Stand Sit to Stand: Min guard                  Balance Overall balance assessment: Needs assistance Sitting-balance support: No upper extremity supported, Feet supported Sitting balance-Leahy Scale: Good     Standing balance support: No upper extremity supported, During functional activity Standing balance-Leahy Scale: Fair                             ADL either performed or assessed with clinical judgement   ADL Overall ADL's : Needs assistance/impaired                                       General ADL Comments: SETUP don B shoes seated EOB. SBA grooming standing sink side. SBA + RW for funcitonal mobility, tolerates ~200 ft, cues for safe RW use      Pertinent Vitals/Pain Pain Assessment Pain Assessment: No/denies pain  Hand Dominance Right   Extremity/Trunk Assessment Upper Extremity Assessment Upper Extremity Assessment: Overall WFL for tasks assessed   Lower Extremity Assessment Lower Extremity Assessment: Generalized weakness       Communication Communication Communication: No difficulties   Cognition Arousal/Alertness: Awake/alert Behavior During Therapy: WFL for tasks assessed/performed, Impulsive Overall Cognitive Status: History of cognitive impairments - at baseline                                  General Comments: frequently leaves walker, repeated cues for safe RW use. Oriented to hospital, year, and situation however unable to state DOW or recall after being told                Home Living Family/patient expects to be discharged to:: Private residence Living Arrangements: Alone Available Help at Discharge: Family;Available PRN/intermittently Type of Home: House (condo) Home Access: Stairs to enter [employer] of Steps: 3 Entrance Stairs-Rails: Can reach both Home Layout: One level     Bathroom Shower/Tub: Walk-in shower         Home Equipment: Conservation officer, nature (2 wheels);Rollator (4 wheels);Shower seat          Prior Functioning/Environment Prior Level of Function : Independent/Modified Independent;History of Falls (last six months)             Mobility Comments: reports 3 falls in last 3 months ADLs Comments: assist for IADLs, son organizes medication however pt frequently forgets to take it. assist for meals. DiL assists with showers        OT Problem List: Decreased strength;Decreased activity tolerance;Impaired balance (sitting and/or standing);Decreased safety awareness      OT Treatment/Interventions: Self-care/ADL training;Therapeutic exercise;Energy conservation;DME and/or AE instruction;Therapeutic activities;Balance training;Patient/family education    OT Goals(Current goals can be found in the care plan section) Acute Rehab OT Goals Patient Stated Goal: to go home OT Goal Formulation: With patient/family Time For Goal Achievement: 10/19/21 Potential to Achieve Goals: Good ADL Goals Pt Will Transfer to Toilet: with modified independence;ambulating;regular height toilet Additional ADL Goal #1: Pt will independently verbalize plan to implement x3 falls prevention strategies Additional ADL Goal #2: Pt will complete pill box assessment with no cues  OT Frequency: Min 2X/week    Co-evaluation               AM-PAC OT "6 Clicks" Daily Activity     Outcome Measure Help from another person eating meals?: None Help from another person taking care of personal grooming?: A Little Help from another person toileting, which includes using toliet, bedpan, or urinal?: A Little Help from another person bathing (including washing, rinsing, drying)?: A Little Help from another person to put on and taking off regular upper body clothing?: None Help from another person to put on and taking off regular lower body clothing?: A Little 6 Click Score: 20   End of Session    Activity Tolerance: Patient tolerated treatment well Patient left: in bed;with call bell/phone within reach;with bed alarm set;with family/visitor present  OT Visit Diagnosis: History of falling (Z91.81);Other abnormalities of gait and mobility (R26.89)                Time: 1540-0867 OT Time Calculation (min): 35 min Charges:  OT General Charges $OT Visit: 1 Visit OT Evaluation $OT Eval Moderate Complexity: 1 Mod OT Treatments $Self Care/Home Management : 8-22 mins $Therapeutic Activity: 8-22 mins  Dessie Coma, M.S. OTR/L  10/05/21,  10:42 AM  ascom [phone]

## 2021-10-05 NOTE — Progress Notes (Signed)
  Progress Note   Patient: Laura David OBS:[id number] DOB: Jul 01, 1934 DOA: 10/03/2021     1 DOS: the patient was seen and examined on 10/05/2021   Brief hospital course: Laura David is a 87 y.o. female with medical history significant for Paroxysmal atrial fibrillation on warfarin, breast cancer, hypertension, mild cognitive impairment, who presents to the ED with concerns for confusion beyond her baseline.  Patient has abnormal urine, urine culture was sent out, she is diagnosed with UTI, started on Rocephin.  Assessment and Plan: * Acute metabolic encephalopathy E coli Urinary tract infection Dementia ([place]) Urine culture came back with E. coli, susceptibilities still pending to We will continue Rocephin for now, patient was able to walk today with RN.   PAF (paroxysmal atrial fibrillation) (HCC) Continue warfarin.   Fall at home, initial encounter History of falls with injury Ambulates with walker PT/OT.   Hypertension Continue amlodipine and metoprolol        Subjective:  Patient seem to have his baseline confusion, he slept well last night, no agitation today. She was able to walk in the hallway with nursing assistant without a walker.   Physical Exam: Vitals:   10/04/21 2030 10/05/21 0506 10/05/21 0518 10/05/21 0819  BP: 134/76 (!) 158/89 (!) 141/82 (!) 164/95  Pulse: 84 83 80 89  Resp: '20  19 16  '$ Temp: 98.1 F (36.7 C) 97.6 F (36.4 C)  98 F (36.7 C)  TempSrc:  Oral    SpO2: 99% (!) 88% 96% 92%  Weight:      Height:       General exam: Appears calm and comfortable  Respiratory system: Clear to auscultation. Respiratory effort normal. Cardiovascular system: S1 & S2 heard, RRR. No JVD, murmurs, rubs, gallops or clicks. No pedal edema. Gastrointestinal system: Abdomen is nondistended, soft and nontender. No organomegaly or masses felt. Normal bowel sounds heard. Central nervous system: Alert and oriented x2. No focal neurological deficits. Extremities:  Symmetric 5 x 5 power. Skin: No rashes, lesions or ulcers Psychiatry: Mood & affect appropriate.   Data Reviewed:    Family Communication: Son updated.  Disposition: Status is: Inpatient Remains inpatient appropriate because: Severity of disease, IV antibiotics.  Planned Discharge Destination: Home with Home Health    Time spent: 35 minutes  Author: Sharen Hones, MD 10/05/2021 1:38 PM  For on call review [email or web address].

## 2021-10-06 DIAGNOSIS — B9620 Unspecified Escherichia coli [E. coli] as the cause of diseases classified elsewhere: Secondary | ICD-10-CM | POA: Diagnosis not present

## 2021-10-06 DIAGNOSIS — G9341 Metabolic encephalopathy: Secondary | ICD-10-CM | POA: Diagnosis not present

## 2021-10-06 DIAGNOSIS — I480 Paroxysmal atrial fibrillation: Secondary | ICD-10-CM | POA: Diagnosis not present

## 2021-10-06 DIAGNOSIS — N390 Urinary tract infection, site not specified: Secondary | ICD-10-CM | POA: Diagnosis not present

## 2021-10-06 LAB — URINE CULTURE: Culture: >=100000 — AB

## 2021-10-06 LAB — PROTIME-INR
INR: 2.4 — ABNORMAL HIGH (ref 0.8–1.2)
Prothrombin Time: 26.3 seconds — ABNORMAL HIGH (ref 11.4–15.2)

## 2021-10-06 MED ORDER — WARFARIN SODIUM 1 MG PO TABS
1.0000 mg | ORAL_TABLET | Freq: Once | ORAL | Status: AC
  Administered 2021-10-06: 1 mg via ORAL
  Filled 2021-10-06: qty 1

## 2021-10-06 MED ORDER — CEPHALEXIN 500 MG PO CAPS: 500.0000 mg | ORAL_CAPSULE | Freq: Three times a day (TID) | ORAL | 0 refills | Status: DC

## 2021-10-06 MED ORDER — DICLOFENAC SODIUM 1 % EX GEL
2.0000 g | Freq: Three times a day (TID) | CUTANEOUS | Status: DC | PRN
  Administered 2021-10-06 – 2021-10-09 (×3): 2 g via TOPICAL
  Filled 2021-10-06: qty 100

## 2021-10-06 MED ORDER — QUETIAPINE FUMARATE 25 MG PO TABS
25.0000 mg | ORAL_TABLET | Freq: Every day | ORAL | Status: DC
  Administered 2021-10-06: 25 mg via ORAL
  Filled 2021-10-06: qty 1

## 2021-10-06 NOTE — Progress Notes (Signed)
Patient is more calm today. Tele-sitter d/c.

## 2021-10-06 NOTE — Discharge Summary (Signed)
Physician Discharge Summary   Patient: Laura David MRN: [id number] DOB: January 22, 1934  Admit date:     10/03/2021  Discharge date: 10/06/21  Discharge Physician: Sharen Hones   PCP: [name], MD   Recommendations at discharge:   Follow-up with PCP in 1 week.  Discharge Diagnoses: Principal Problem:   Acute metabolic encephalopathy Active Problems:   E. coli UTI   PAF (paroxysmal atrial fibrillation) ([place])   Hypertension   Fall at home, initial encounter   Anticoagulated on Coumadin   Dementia ([place])  Resolved Problems:   * No resolved hospital problems. *  Hospital Course: Laura David is a 87 y.o. female with medical history significant for Paroxysmal atrial fibrillation on warfarin, breast cancer, hypertension, mild cognitive impairment, who presents to the ED with concerns for confusion beyond her baseline.  Patient has abnormal urine, urine culture was sent out, she is diagnosed with UTI, started on Rocephin.  Assessment and Plan: * Acute metabolic encephalopathy E coli Urinary tract infection Dementia ([place]) Patient is treated with Rocephin, mental status has improved.  Currently, patient has no agitation.  She appears back to her baseline with mental status.  Urine culture finally came back with E. coli pansensitive.  We will continue Keflex for additional 3 days to complete 5 days antibiotic course.   PAF (paroxysmal atrial fibrillation) (HCC) Continue warfarin.   Fall at home, initial encounter History of falls with injury Ambulates with walker Home health with PT/OT.   Hypertension Continue amlodipine and metoprolol         Consultants: None Procedures performed: None  Disposition: Home health Diet recommendation:  Discharge Diet Orders (From admission, onward)     Start     Ordered   10/06/21 0000  Diet - low sodium heart healthy        10/06/21 0955           Cardiac diet DISCHARGE MEDICATION: Allergies as of 10/06/2021        Reactions   Flagyl [metronidazole] Nausea Only   Pacerone [amiodarone]    Eye deposits   Ramipril Cough        Medication List     TAKE these medications    alendronate 70 MG tablet Commonly known as: FOSAMAX Take 70 mg by mouth once a week. (Sundays)   amLODipine 5 MG tablet Commonly known as: NORVASC Take 5 mg by mouth daily. What changed: Another medication with the same name was removed. Continue taking this medication, and follow the directions you see here.   atorvastatin 10 MG tablet Commonly known as: LIPITOR Take 10 mg by mouth at bedtime.   cephALEXin 500 MG capsule Commonly known as: KEFLEX Take 1 capsule (500 mg total) by mouth 3 (three) times daily for 3 days.   escitalopram 10 MG tablet Commonly known as: LEXAPRO Take 10 mg by mouth daily.   memantine 10 MG tablet Commonly known as: NAMENDA Take 10 mg by mouth in the morning and at bedtime.   metoprolol tartrate 25 MG tablet Commonly known as: LOPRESSOR Take 25 mg by mouth 2 (two) times daily.   mirtazapine 7.5 MG tablet Commonly known as: REMERON Take 7.5 mg by mouth at bedtime.   warfarin 2 MG tablet Commonly known as: COUMADIN Take 2 mg by mouth daily. TAKE AS DIRECTED.               Durable Medical Equipment  (From admission, onward)  Start     Ordered   10/05/21 1038  For home use only DME 3 n 1  Once        10/05/21 1037            Follow-up Information     [name], MD Follow up in 1 week(s).   Specialty: Family Medicine Contact information: [address] [address] [phone]                Discharge Exam: Danley Danker Weights   10/03/21 2009  Weight: 49.9 kg   General exam: Appears calm and comfortable  Respiratory system: Clear to auscultation. Respiratory effort normal. Cardiovascular system: S1 & S2 heard, RRR. No JVD, murmurs, rubs, gallops or clicks. No pedal edema. Gastrointestinal system: Abdomen is nondistended,  soft and nontender. No organomegaly or masses felt. Normal bowel sounds heard. Central nervous system: Alert and oriented x2. No focal neurological deficits. Extremities: Symmetric 5 x 5 power. Skin: No rashes, lesions or ulcers Psychiatry:  Mood & affect appropriate.    Condition at discharge: good  The results of significant diagnostics from this hospitalization (including imaging, microbiology, ancillary and laboratory) are listed below for reference.   Imaging Studies: DG Sacrum/Coccyx  Result Date: 10/03/2021 CLINICAL DATA:  Coccyx pain after fall EXAM: SACRUM AND COCCYX - 2+ VIEW COMPARISON:  Radiograph 01/09/2020 FINDINGS: There is no evidence of fracture or other focal bone lesions. Demineralization. IMPRESSION: Negative. Electronically Signed   By: Placido Sou M.D.   On: 10/03/2021 20:56   DG Shoulder Right  Result Date: 10/03/2021 CLINICAL DATA:  Right shoulder pain after fall EXAM: RIGHT SHOULDER - 2+ VIEW COMPARISON:  Radiographs 01/09/2020 FINDINGS: No acute fracture or dislocation. Advanced hypertrophic degenerative arthritis in the right shoulder. IMPRESSION: No acute fracture or dislocation. Advanced right shoulder arthritis. Electronically Signed   By: Placido Sou M.D.   On: 10/03/2021 20:54   CT Head Wo Contrast  Result Date: 10/03/2021 CLINICAL DATA:  Head trauma, intracranial arterial injury suspected fall. Fall EXAM: CT HEAD WITHOUT CONTRAST TECHNIQUE: Contiguous axial images were obtained from the base of the skull through the vertex without intravenous contrast. RADIATION DOSE REDUCTION: This exam was performed according to the departmental dose-optimization program which includes automated exposure control, adjustment of the mA and/or kV according to patient size and/or use of iterative reconstruction technique. COMPARISON:  08/13/2021 FINDINGS: Brain: There is atrophy and chronic small vessel disease changes. No acute intracranial abnormality. Specifically, no  hemorrhage, hydrocephalus, mass lesion, acute infarction, or significant intracranial injury. Vascular: No hyperdense vessel or unexpected calcification. Skull: No acute calvarial abnormality. Sinuses/Orbits: No acute findings Other: None IMPRESSION: Atrophy, chronic microvascular disease. No acute intracranial abnormality. Electronically Signed   By: Rolm Baptise M.D.   On: 10/03/2021 20:35    Microbiology: Results for orders placed or performed during the hospital encounter of 10/03/21  Urine Culture     Status: Abnormal   Collection Time: 10/03/21 11:35 PM   Specimen: Urine, Random  Result Value Ref Range Status   Specimen Description   Final    URINE, RANDOM Performed at [facility], [address], [place], [place] [address]    Special Requests   Final    NONE Performed at [facility], [address] [place], [place] [address]    Culture >=100,000 COLONIES/mL ESCHERICHIA COLI (A)  Final   Report Status 10/06/2021 FINAL  Final   Organism ID, Bacteria ESCHERICHIA COLI (A)  Final  Susceptibility   Escherichia coli - MIC*    AMPICILLIN 8 SENSITIVE Sensitive     CEFAZOLIN <=4 SENSITIVE Sensitive     CEFEPIME <=0.12 SENSITIVE Sensitive     CEFTRIAXONE <=0.25 SENSITIVE Sensitive     CIPROFLOXACIN <=0.25 SENSITIVE Sensitive     GENTAMICIN <=1 SENSITIVE Sensitive     IMIPENEM <=0.25 SENSITIVE Sensitive     NITROFURANTOIN <=16 SENSITIVE Sensitive     TRIMETH/SULFA <=20 SENSITIVE Sensitive     AMPICILLIN/SULBACTAM 4 SENSITIVE Sensitive     PIP/TAZO <=4 SENSITIVE Sensitive     * >=100,000 COLONIES/mL ESCHERICHIA COLI    Labs: CBC: Recent Labs  Lab 10/03/21 2013  WBC 7.5  NEUTROABS 4.7  HGB 14.7  HCT 45.7  MCV 93.1  PLT 242   Basic Metabolic Panel: Recent Labs  Lab 10/03/21 2013  NA 143  K 3.8  CL 102  CO2 32  GLUCOSE 113*  BUN 20  CREATININE 0.95  CALCIUM 10.7*   Liver Function Tests: Recent Labs  Lab 10/03/21 2013  AST 30  ALT 17   ALKPHOS 77  BILITOT 1.0  PROT 8.1  ALBUMIN 4.5   CBG: No results for input(s): "GLUCAP" in the last 168 hours.  Discharge time spent: greater than 30 minutes.  Signed: Sharen Hones, MD [facility] 10/06/2021

## 2021-10-06 NOTE — Progress Notes (Signed)
Had a long discussion with patient son, family is not comfortable taking patient home today.  She did not sleep well at night last night. We talked about she does not need a SNF per PT recommendation. Patient and family also does not want going to a memory unit.  Currently patient is living by herself with the son [place] a different city. Eventually, patient had to move to close to his son. For now, I will start lower dose Seroquel to help with sleep.  Can be discharged tomorrow with home care, Seroquel if it worked.

## 2021-10-06 NOTE — TOC Initial Note (Addendum)
Transition of Care [facility]) - Initial/Assessment Note    Patient Details  Name: Laura David MRN: [id number] Date of Birth: May 04, 1934  Transition of Care [facility]) CM/SW Contact:    Magnus Ivan, LCSW Phone Number: 10/06/2021, 10:59 AM  Clinical Narrative:                 Per RN, patient still has some confusion. Patient has orders to DC home today. PT/OT recs for [facility] and 3in1. Spoke with son Lennette Bihari via phone. Lennette Bihari stated he is concerned about patient discharging home today and would like to speak to MD, MD notified and to meet with Lennette Bihari at bedside when he arrives. Lennette Bihari stated patient lives alone. PCP is Dr. Lovie Macadamia. Patient has a walker, cane, raised toilet, grab bars in bathroom, and built in shower seat at home - Lennette Bihari declines 3in1. Lennette Bihari provides transportation. Lennette Bihari checks on her at least once a day and Kevin's wife also helps with her care when able. Lennette Bihari stated they both work and are unable to provide help at home 24/7. Lennette Bihari discussed having increased caregiver burnout. He declines wanting placement or private pay sitter/aide resources. He stated home health would be helpful since he would have someone to help check on patient. He understands this is only a few times a week and not daily services. He understands daily services are not covered by insurance.  Patient had [facility] in the past through [place] and Lennette Bihari stated he wants to try another agency. Confirmed home address. [place] referral accepted by Malachy Mood with [facility] for PT, OT, RN, Aide. Provided Cheryl's contact info to [place]. Cheryl aware of DC home today.   12:37- Notified Cheryl with [facility] that DC has been postponed to tomorrow.    Expected Discharge Plan: [place] Barriers to Discharge: Continued Medical Work up   Patient Goals and CMS Choice Patient states their goals for this hospitalization and ongoing recovery are:: home with home health CMS Medicare.gov Compare Post Acute Care list provided to::  Patient Represenative (must comment) Choice offered to / list presented to : Adult Children  Expected Discharge Plan and Services Expected Discharge Plan: [place]       Living arrangements for the past 2 months: Single Family Home Expected Discharge Date: 10/06/21                         HH Arranged: PT, OT, RN, Nurse's Aide HH Agency: [place] Date [place]: 10/06/21   Representative spoke with at [place]: Malachy Mood  Prior Living Arrangements/Services Living arrangements for the past 2 months: [place] Lives with:: Self Patient language and need for interpreter reviewed:: Yes Do you feel safe going back to the place where you live?: Yes      Need for Family Participation in Patient Care: Yes (Comment) Care giver support system in place?: Yes (comment) Current home services: DME Criminal Activity/Legal Involvement Pertinent to Current Situation/Hospitalization: No - Comment as needed  Activities of Daily Living Home Assistive Devices/Equipment: Cane (specify quad or straight), Wheelchair, Environmental consultant (specify type) ADL Screening (condition at time of admission) Patient's cognitive ability adequate to safely complete daily activities?: Yes Is the patient deaf or have difficulty hearing?: No Does the patient have difficulty seeing, even when wearing glasses/contacts?: No Does the patient have difficulty concentrating, remembering, or making decisions?: Yes Patient able to express need for assistance with ADLs?: Yes Does the patient  have difficulty dressing or bathing?: No Independently performs ADLs?: Yes (appropriate for developmental age) Does the patient have difficulty walking or climbing stairs?: Yes Weakness of Legs: Both Weakness of Arms/Hands: None  Permission Sought/Granted Permission sought to share information with : Facility Art therapist granted to share information with : Yes, Verbal  Permission Granted (by son Lennette Bihari)     Permission granted to share info w AGENCY: [place] agencies        Emotional Assessment       Orientation: : Fluctuating Orientation (Suspected and/or reported [place]) Alcohol / Substance Use: Not Applicable Psych Involvement: No (comment)  Admission diagnosis:  Metabolic encephalopathy [P95.09] Acute cystitis without hematuria [N30.00] Altered mental status, unspecified altered mental status type [T26.71] Acute metabolic encephalopathy [I45.80] Patient Active Problem List   Diagnosis Date Noted   Anticoagulated on Coumadin 99/83/3825   Acute metabolic encephalopathy 05/39/7673   E. coli UTI 10/04/2021   Dementia ([place]) 10/04/2021   Closed fracture of left wrist    Coagulopathy ([place]) 01/09/2020   Subdural hematoma ([place]) 01/09/2020   Leucocytosis 01/09/2020   Fall at home, initial encounter 01/09/2020   Hypokalemia 01/09/2020   Closed comminuted left humeral fracture 01/09/2020   Mitral regurgitation 05/10/2013   Tricuspid regurgitation 05/10/2013   Hypertension    Hyperlipidemia    PAF (paroxysmal atrial fibrillation) ([place])    Osteopenia    Cancer ([place])    Asthma    GERD (gastroesophageal reflux disease)    Rickettsial disease    Breast cancer ([place])    PCP:  [name], MD Pharmacy:   [facility] [address] [place] [address] Phone: [phone] Fax: [phone]  [id number] [address], [place] [address] Phone: [phone] Fax: [phone]     Social Determinants of Health (SDOH) Interventions    Readmission Risk Interventions    01/13/2020    4:33 PM  Readmission Risk Prevention Plan  Transportation Screening Complete  Medication Review (RN CM) Complete

## 2021-10-06 NOTE — Progress Notes (Signed)
ANTICOAGULATION CONSULT NOTE  Pharmacy Consult for Warfarin  Indication: atrial fibrillation   Allergies  Allergen Reactions   Flagyl [Metronidazole] Nausea Only   Pacerone [Amiodarone]     Eye deposits   Ramipril Cough    Patient Measurements: Height: 5' (152.4 cm) Weight: 49.9 kg (110 lb) IBW/kg (Calculated) : 45.5  Vital Signs: Temp: 98 F (36.7 C) (10/01 0447) BP: 140/83 (10/01 0447) Pulse Rate: 71 (10/01 0447)  Labs: Recent Labs    10/03/21 2013 10/04/21 0012 10/04/21 0558 10/05/21 0649 10/06/21 0612  HGB 14.7  --   --   --   --   HCT 45.7  --   --   --   --   PLT 270  --   --   --   --   LABPROT 18.9*  --  19.5* 21.2* 26.3*  INR 1.6*  --  1.7* 1.9* 2.4*  CREATININE 0.95  --   --   --   --   TROPONINIHS 8 9  --   --   --      Estimated Creatinine Clearance: 31.1 mL/min (by C-G formula based on SCr of 0.95 mg/dL).   Medical History: Past Medical History:  Diagnosis Date   Asthma    Breast cancer ([place]) 2010   RIGHT   Cancer ([place])    basel cell, s/p MOH'S/DR. 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   Pt is confused so cannot answer when she last had warfarin.   home warfarin dose: 2 MG tablet every Sunday/Wednesday and 1 mg all other days  Goal of Therapy:  INR 2-3  Date INR Dose 9/29 1.7 '2mg'$  9/30 1.9 '2mg'$  10/1 2.4    Plan:  Will order warfarin 1 mg PO X 1  Will check INR daily.   Annis Lagoy A Muslima Toppins 10/06/2021,8:28 AM

## 2021-10-06 NOTE — Progress Notes (Signed)
Mobility Specialist - Progress Note   10/06/21 1433  Mobility  Activity Ambulated with assistance to bathroom;Stood at bedside;Dangled on edge of bed  Level of Assistance Standby assist, set-up cues, supervision of patient - no hands on  Assistive Device Front wheel walker  Activity Response Tolerated well  $Mobility charge 1 Mobility   Pt sitting upright in bed on RA upon arrival. Pt Scoots to EOB with HHA and STS and ambulates 1 lap around NS SBA. Pt needs VC for safety awareness and RW management. Pt tends to veer to the right and take hands off RW. Pt returns to bed with needs in reach and bed alarm set.   Gretchen Short  Mobility Specialist  10/06/21 2:36 PM

## 2021-10-06 NOTE — Progress Notes (Signed)
       CROSS COVER NOTE  NAME: Laura David MRN: [id number] DOB : [birth year]    Date of Service   10/06/2021   HPI/Events of Note   Pt is c/o pain in knees and hands. States 8/10. Crying with ambulation to bathroom.  Interventions   Assessment/Plan: Voltaren PRN X X      This document was prepared using Dragon voice recognition software and may include unintentional dictation errors.  [name] DNP, MHA, FNP-BC Nurse Practitioner [facility] Pager [phone]

## 2021-10-07 ENCOUNTER — Inpatient Hospital Stay: Payer: Medicare Other

## 2021-10-07 DIAGNOSIS — N390 Urinary tract infection, site not specified: Secondary | ICD-10-CM | POA: Diagnosis not present

## 2021-10-07 DIAGNOSIS — F03918 Unspecified dementia, unspecified severity, with other behavioral disturbance: Secondary | ICD-10-CM

## 2021-10-07 DIAGNOSIS — M25511 Pain in right shoulder: Secondary | ICD-10-CM | POA: Diagnosis not present

## 2021-10-07 DIAGNOSIS — F05 Delirium due to known physiological condition: Secondary | ICD-10-CM

## 2021-10-07 DIAGNOSIS — I480 Paroxysmal atrial fibrillation: Secondary | ICD-10-CM | POA: Diagnosis not present

## 2021-10-07 LAB — COMPREHENSIVE METABOLIC PANEL
ALT: 15 U/L (ref 0–44)
AST: 25 U/L (ref 15–41)
Albumin: 3.8 g/dL (ref 3.5–5.0)
Alkaline Phosphatase: 62 U/L (ref 38–126)
Anion gap: 9 (ref 5–15)
BUN: 33 mg/dL — ABNORMAL HIGH (ref 8–23)
CO2: 27 mmol/L (ref 22–32)
Calcium: 9.8 mg/dL (ref 8.9–10.3)
Chloride: 105 mmol/L (ref 98–111)
Creatinine, Ser: 0.9 mg/dL (ref 0.44–1.00)
GFR, Estimated: >60 mL/min (ref >60)
Glucose, Bld: 70 mg/dL (ref 70–99)
Potassium: 3.4 mmol/L — ABNORMAL LOW (ref 3.5–5.1)
Sodium: 141 mmol/L (ref 135–145)
Total Bilirubin: 0.5 mg/dL (ref 0.3–1.2)
Total Protein: 7.1 g/dL (ref 6.5–8.1)

## 2021-10-07 LAB — CBC
HCT: 45.7 % (ref 36.0–46.0)
Hemoglobin: 14.9 g/dL (ref 12.0–15.0)
MCH: 30.1 pg (ref 26.0–34.0)
MCHC: 32.6 g/dL (ref 30.0–36.0)
MCV: 92.3 fL (ref 80.0–100.0)
Platelets: 237 10*3/uL (ref 150–400)
RBC: 4.95 MIL/uL (ref 3.87–5.11)
RDW: 13.3 % (ref 11.5–15.5)
WBC: 6.3 10*3/uL (ref 4.0–10.5)
nRBC: 0 % (ref 0.0–0.2)

## 2021-10-07 LAB — BRAIN NATRIURETIC PEPTIDE: B Natriuretic Peptide: 32.3 pg/mL (ref 0.0–100.0)

## 2021-10-07 LAB — PROTIME-INR
INR: 2.8 — ABNORMAL HIGH (ref 0.8–1.2)
Prothrombin Time: 29.4 seconds — ABNORMAL HIGH (ref 11.4–15.2)

## 2021-10-07 LAB — PROCALCITONIN: Procalcitonin: <0.1 ng/mL

## 2021-10-07 LAB — AMMONIA: Ammonia: 12 umol/L (ref 9–35)

## 2021-10-07 MED ORDER — MEMANTINE HCL 5 MG PO TABS
10.0000 mg | ORAL_TABLET | Freq: Two times a day (BID) | ORAL | Status: DC
  Administered 2021-10-07 – 2021-10-11 (×8): 10 mg via ORAL
  Filled 2021-10-07 (×9): qty 2

## 2021-10-07 MED ORDER — ESCITALOPRAM OXALATE 10 MG PO TABS
10.0000 mg | ORAL_TABLET | Freq: Every day | ORAL | Status: DC
  Filled 2021-10-07: qty 1

## 2021-10-07 MED ORDER — MIRTAZAPINE 15 MG PO TABS
7.5000 mg | ORAL_TABLET | Freq: Every day | ORAL | Status: DC
  Administered 2021-10-07 – 2021-10-10 (×3): 7.5 mg via ORAL
  Filled 2021-10-07 (×4): qty 1

## 2021-10-07 NOTE — Progress Notes (Signed)
[facility] Progress Note  Patient: Laura David    PPI:[id number]  DOA: 10/03/2021    Date of Service: the patient was seen and examined on 10/07/2021  Brief hospital course: Laura David is a 87 y.o. female with medical history significant for Paroxysmal atrial fibrillation on warfarin, breast cancer, hypertension, and dementia who presented to the emergency room on 9/29 for increased confusion which was found to be secondary to E. coli UTI.  Patient initially put on antibiotics however during hospitalization, confusion looks to have worsened.  Assessment and Plan: Assessment and Plan: * Senile dementia with delirium with behavioral disturbance ([place]) Patient with increased somnolence over the past 3 weeks.  This is felt to be secondary to UTI and has improved.  However, patient has been more acutely confused during hospitalization than at her baseline.  Some of this may be acute delirium in the setting of new environment.  Checking repeat labs to ensure no additional infection or electrolyte abnormality.  Patient is also not been on her Remeron or Namenda and will restart these.  E. coli UTI Pansensitive E. coli.  On IV Rocephin x5 days.  PAF (paroxysmal atrial fibrillation) (HCC) Subtherapeutic INR on admission.  Pharmacy managing Coumadin.  Now therapeutic.  Continue metoprolol.  Right shoulder pain Patient quite guarded on exam and limited range of motion.  Difficult to get length of time although patient's son clarifies that she has had some issues with this before but has had very limited use of it.  He does not know if she fell recently again.  Checking MRI.  Dementia ([place]) Continue Remeron Delirium precautions --Get up during the day --Keep blinds open and lights on during daylight hours --Minimize the use of opioids/benzodiazepines --Reorient the patient frequently, provide easily visible clock and calendar --Provide sensory aids like glasses, hearing  aids --Encourage ambulation, regular activities and visitors to maintain cognitive stimulation  --Patient would benefit from having family members at bedside to reinforce his orientation.   Fall at home, initial encounter History of falls with injury Ambulates with walker Son states patient does not use walker and she should and will hold onto things instead when she is walking.  Seen by PT and OT who are recommending home health.  No evidence of trauma on imaging however patient complaining of right shoulder pain.  See below.  Hypertension Continue amlodipine and metoprolol       Body mass index is 21.48 kg/m.        Consultants: None  Procedures: None  Antimicrobials: IV Rocephin 9/30-present  Code Status: DNR   Subjective: Patient complains of right shoulder pain, feeling confused  Objective: Vital signs were reviewed and unremarkable. Vitals:   10/07/21 0752 10/07/21 0847  BP: (!) 140/80 138/68  Pulse: 68   Resp: 18   Temp: 97.7 F (36.5 C)   SpO2: 99%     Intake/Output Summary (Last 24 hours) at 10/07/2021 1317 Last data filed at 10/07/2021 0800 Gross per 24 hour  Intake 240 ml  Output --  Net 240 ml   Filed Weights   10/03/21 2009  Weight: 49.9 kg   Body mass index is 21.48 kg/m.  Exam:  General: Oriented x1-2, mild distress from pain and anxiety HEENT: Normocephalic, atraumatic, mucous membranes are moist Cardiovascular: Irregular rhythm, rate controlled Respiratory: Clear to auscultation bilaterally Abdomen: Soft, nontender, nondistended, positive bowel sounds Musculoskeletal: No clubbing or cyanosis or edema.  Right shoulder limited in range from approximately downward position to about 90  degrees maximum Skin: No skin breaks, tears or lesions Psychiatry: Underlying dementia, anxious Neurology: No focal deficits  Data Reviewed: Today's labs are pending  Disposition:  Status is: Inpatient Remains inpatient appropriate because:  Evaluation of right shoulder -Looking at additional factors for delirium    Anticipated discharge date: 10/3  Family Communication: Son at the bedside DVT Prophylaxis:   Therapeutic on Coumadin    Author: Annita Brod ,MD 10/07/2021 1:17 PM  To reach On-call, see care teams to locate the attending and reach out via [email or web address]. Between 7PM-7AM, please contact night-coverage If you still have difficulty reaching the attending provider, please page the [facility] (Director on Call) for [facility] on amion for assistance.

## 2021-10-07 NOTE — Assessment & Plan Note (Addendum)
MRI notes rotator cuff tears and significant glenohumeral arthritis.  Discussed with Dr. Roland Rack, orthopedic surgery, and after evaluation the patient and reviewing the films, performed right shoulder joint steroid injection.

## 2021-10-07 NOTE — Progress Notes (Signed)
Occupational Therapy Treatment Patient Details Name: Laura David MRN: [id number] DOB: Jan 20, 1934 Today's Date: 10/07/2021   History of present illness Laura David is a 87 y.o. female with medical history significant for Paroxysmal atrial fibrillation on warfarin, breast cancer, hypertension, mild cognitive impairment, who presents to the ED with concerns for confusion beyond her baseline. As well as a fall earlier in the day with pain to her right shoulder and coccyx   OT comments  [name] was seen for OT treatment on this date. Upon arrival to room pt reclined in chair, family at bed side, requesting to use toilet.Pt requires SUPERVISION for toielt t/f and pericare. MIN cues + SBA hand washing - cues to locate soap / paper towels. Pt significantly SOB during session, family reports PRN O2 at home, RN notified and in to assess O2 needs. Pt making progress toward goals, will continue to follow POC. Discharge recommendation remains appropriate.     Recommendations for follow up therapy are one component of a multi-disciplinary discharge planning process, led by the attending physician.  Recommendations may be updated based on patient status, additional functional criteria and insurance authorization.    Follow Up Recommendations  Home health OT    Assistance Recommended at Discharge Intermittent Supervision/Assistance  Patient can return home with the following  Assistance with cooking/housework;Direct supervision/assist for medications management;Help with stairs or ramp for entrance   Equipment Recommendations  BSC/3in1    Recommendations for Other Services      Precautions / Restrictions Precautions Precautions: Fall Restrictions Weight Bearing Restrictions: No       Mobility Bed Mobility               General bed mobility comments: received and left sitting    Transfers Overall transfer level: Needs assistance Equipment used: Rolling walker (2  wheels) Transfers: Sit to/from Stand Sit to Stand: Min guard           General transfer comment: SBA recliner, cues from toilet height     Balance Overall balance assessment: Needs assistance Sitting-balance support: No upper extremity supported, Feet supported Sitting balance-Leahy Scale: Good     Standing balance support: No upper extremity supported, During functional activity Standing balance-Leahy Scale: Fair                             ADL either performed or assessed with clinical judgement   ADL Overall ADL's : Needs assistance/impaired                                       General ADL Comments: SUPERVISION for toielt t/f and pericare. MIN cues + SBA hand washing - cues to locate soap / paper towels.    Extremity/Trunk Assessment Upper Extremity Assessment Upper Extremity Assessment: RUE deficits/detail RUE: Unable to fully assess due to pain   Lower Extremity Assessment Lower Extremity Assessment: Generalized weakness         Cognition Arousal/Alertness: Awake/alert Behavior During Therapy: WFL for tasks assessed/performed, Impulsive Overall Cognitive Status: History of cognitive impairments - at baseline                                                General Comments SpO2 not reliable  reading however pt significantly SOB during mobility    Pertinent Vitals/ Pain       Pain Assessment Pain Assessment: Faces Faces Pain Scale: Hurts even more Pain Location: R shoulder movement Pain Descriptors / Indicators: Grimacing, Guarding, Moaning Pain Intervention(s): Limited activity within patient's tolerance, Repositioned   Frequency  Min 2X/week        Progress Toward Goals  OT Goals(current goals can now be found in the care plan section)  Progress towards OT goals: Progressing toward goals  Acute Rehab OT Goals Patient Stated Goal: to improve pain OT Goal Formulation: With patient/family Time For  Goal Achievement: 10/19/21 Potential to Achieve Goals: Good ADL Goals Pt Will Transfer to Toilet: with modified independence;ambulating;regular height toilet Additional ADL Goal #1: Pt will independently verbalize plan to implement x3 falls prevention strategies Additional ADL Goal #2: Pt will complete pill box assessment with no cues  Plan Discharge plan remains appropriate;Frequency remains appropriate    Co-evaluation                 AM-PAC OT "6 Clicks" Daily Activity     Outcome Measure   Help from another person eating meals?: None Help from another person taking care of personal grooming?: A Little Help from another person toileting, which includes using toliet, bedpan, or urinal?: A Little Help from another person bathing (including washing, rinsing, drying)?: A Little Help from another person to put on and taking off regular upper body clothing?: None Help from another person to put on and taking off regular lower body clothing?: A Little 6 Click Score: 20    End of Session    OT Visit Diagnosis: History of falling (Z91.81);Other abnormalities of gait and mobility (R26.89)   Activity Tolerance Patient tolerated treatment well   Patient Left in chair;with call bell/phone within reach;with chair alarm set;with family/visitor present;with nursing/sitter in room   Nurse Communication          Time: 7425-9563 OT Time Calculation (min): 25 min  Charges: OT General Charges $OT Visit: 1 Visit OT Treatments $Self Care/Home Management : 23-37 mins  Dessie Coma, M.S. OTR/L  10/07/21, 2:32 PM  ascom [phone]

## 2021-10-07 NOTE — Progress Notes (Signed)
[place] for Warfarin  Indication: atrial fibrillation   Allergies  Allergen Reactions   Flagyl [Metronidazole] Nausea Only   Pacerone [Amiodarone]     Eye deposits   Ramipril Cough    Patient Measurements: Height: 5' (152.4 cm) Weight: 49.9 kg (110 lb) IBW/kg (Calculated) : 45.5  Vital Signs: Temp: 97.5 F (36.4 C) (10/02 0521) Temp Source: Oral (10/02 0521) BP: 152/74 (10/02 0521) Pulse Rate: 62 (10/02 0521)  Labs: Recent Labs    10/05/21 0649 10/06/21 0612 10/07/21 0548  LABPROT 21.2* 26.3* 29.4*  INR 1.9* 2.4* 2.8*     Estimated Creatinine Clearance: 31.1 mL/min (by C-G formula based on SCr of 0.95 mg/dL).   Medical History: Past Medical History:  Diagnosis Date   Asthma    Breast cancer ([place]) 2010   RIGHT   Cancer ([place])    basel cell, s/p MOH'S/DR. 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   Pt is confused so cannot answer when she last had warfarin.   home warfarin dose: 2 MG tablet every Sunday/Wednesday and 1 mg all other days  Goal of Therapy:  INR 2-3  Date INR Dose 9/29 1.7 '2mg'$  9/30 1.9 '2mg'$  10/1 2.4 '1mg'$  10/2 2.8 --  Drug interactions: ceftriaxone   Plan: Increased INR by +0.9 after warfarin 2 mg x 2 then warfarin 1 mg. Now on upper end of normal. Hold warfarin dose for today Follow up PT-INR and CBC tomorrow AM    Glean Salvo, PharmD Clinical Pharmacist  10/07/2021 7:15 AM

## 2021-10-07 NOTE — Progress Notes (Signed)
Physical Therapy Treatment Patient Details Name: Laura David MRN: [id number] DOB: 22-Jul-1934 Today's Date: 10/07/2021   History of Present Illness Laura David is a 87 y.o. female with medical history significant for Paroxysmal atrial fibrillation on warfarin, breast cancer, hypertension, mild cognitive impairment, who presents to the ED with concerns for confusion beyond her baseline. As well as a fall earlier in the day with pain to her right shoulder and coccyx    PT Comments    Patient sleeping, agreeable to PT once awake. Pt very slow to transition to EOB but performed modI. Sit <> stand from EOB and from standard commode, CGA from low surface, otherwise supervision with cues for hand placement. She was able to use the commode and perform pericare without assistance. She ambulated ~245f with RW and CGA-supervision. Pt does not multitask; stops to talk, look around, and interact with staff. Does tend to walk with feet outside (to the R) of the RW, able to correct with cues. No LOB. The patient would benefit from further skilled PT intervention to continue to progress towards goals. Recommendation remains appropriate.        Recommendations for follow up therapy are one component of a multi-disciplinary discharge planning process, led by the attending physician.  Recommendations may be updated based on patient status, additional functional criteria and insurance authorization.  Follow Up Recommendations  Home health PT     Assistance Recommended at Discharge Set up Supervision/Assistance  Patient can return home with the following A little help with walking and/or transfers;A little help with bathing/dressing/bathroom   Equipment Recommendations  None recommended by PT    Recommendations for Other Services       Precautions / Restrictions Precautions Precautions: Fall Restrictions Weight Bearing Restrictions: No     Mobility  Bed Mobility Overal bed mobility: Modified  Independent             General bed mobility comments: very slow    Transfers Overall transfer level: Needs assistance Equipment used: Rolling walker (2 wheels) Transfers: Sit to/from Stand Sit to Stand: Supervision, Min guard           General transfer comment: CGA for toilet transfer, supervision from EOB and to recliner    Ambulation/Gait Ambulation/Gait assistance: Supervision Gait Distance (Feet): 200 Feet Assistive device: Rolling walker (2 wheels) Gait Pattern/deviations: Step-through pattern, Drifts right/left, Trunk flexed       General Gait Details: Pt ambualtes well, however tends to go to the R side of the walker when ambulating. does not multi-task ( stops to talk, look around, interact, etc)   Stairs             Wheelchair Mobility    Modified Rankin (Stroke Patients Only)       Balance Overall balance assessment: Needs assistance Sitting-balance support: No upper extremity supported, Feet supported Sitting balance-Leahy Scale: Good Sitting balance - Comments: pericare in sitting, modI     Standing balance-Leahy Scale: Fair                              Cognition Arousal/Alertness: Awake/alert Behavior During Therapy: WFL for tasks assessed/performed, Impulsive Overall Cognitive Status: History of cognitive impairments - at baseline  Exercises      General Comments        Pertinent Vitals/Pain Pain Assessment Pain Assessment: Faces Faces Pain Scale: Hurts little more Pain Location: R shoulder movement Pain Descriptors / Indicators: Grimacing, Guarding, Moaning Pain Intervention(s): Limited activity within patient's tolerance, Monitored during session, Repositioned    Home Living                          Prior Function            PT Goals (current goals can now be found in the care plan section) Progress towards PT goals: Progressing  toward goals    Frequency    Min 2X/week      PT Plan Current plan remains appropriate    Co-evaluation              AM-PAC PT "6 Clicks" Mobility   Outcome Measure  Help needed turning from your back to your side while in a flat bed without using bedrails?: None Help needed moving from lying on your back to sitting on the side of a flat bed without using bedrails?: None Help needed moving to and from a bed to a chair (including a wheelchair)?: None Help needed standing up from a chair using your arms (e.g., wheelchair or bedside chair)?: None Help needed to walk in hospital room?: None Help needed climbing 3-5 steps with a railing? : A Little 6 Click Score: 23    End of Session Equipment Utilized During Treatment: Gait belt Activity Tolerance: Patient tolerated treatment well Patient left: with call bell/phone within reach;with chair alarm set;in chair Nurse Communication: Mobility status PT Visit Diagnosis: Unsteadiness on feet (R26.81);Other abnormalities of gait and mobility (R26.89);Repeated falls (R29.6);Muscle weakness (generalized) (M62.81);History of falling (Z91.81);Difficulty in walking, not elsewhere classified (R26.2)     Time: 1610-9604 PT Time Calculation (min) (ACUTE ONLY): 28 min  Charges:  $Therapeutic Exercise: 8-22 mins $Therapeutic Activity: 8-22 mins                     Lieutenant Diego PT, DPT 9:41 AM,10/07/21

## 2021-10-08 LAB — GLUCOSE, CAPILLARY: Glucose-Capillary: 80 mg/dL (ref 70–99)

## 2021-10-08 LAB — CBC
HCT: 40.6 % (ref 36.0–46.0)
Hemoglobin: 13.1 g/dL (ref 12.0–15.0)
MCH: 30.1 pg (ref 26.0–34.0)
MCHC: 32.3 g/dL (ref 30.0–36.0)
MCV: 93.3 fL (ref 80.0–100.0)
Platelets: 223 10*3/uL (ref 150–400)
RBC: 4.35 MIL/uL (ref 3.87–5.11)
RDW: 13.3 % (ref 11.5–15.5)
WBC: 5.9 10*3/uL (ref 4.0–10.5)
nRBC: 0 % (ref 0.0–0.2)

## 2021-10-08 LAB — THYROID PANEL WITH TSH
Free Thyroxine Index: 1.9 (ref 1.2–4.9)
T3 Uptake Ratio: 25 % (ref 24–39)
T4, Total: 7.5 ug/dL (ref 4.5–12.0)
TSH: 1.72 u[IU]/mL (ref 0.450–4.500)

## 2021-10-08 LAB — PROTIME-INR
INR: 2.7 — ABNORMAL HIGH (ref 0.8–1.2)
Prothrombin Time: 28.5 seconds — ABNORMAL HIGH (ref 11.4–15.2)

## 2021-10-08 MED ORDER — BUPIVACAINE HCL (PF) 0.5 % IJ SOLN
10.0000 mL | Freq: Once | INTRAMUSCULAR | Status: AC
  Administered 2021-10-08: 10 mL via INTRA_ARTICULAR
  Filled 2021-10-08: qty 10

## 2021-10-08 MED ORDER — TRIAMCINOLONE ACETONIDE 40 MG/ML IJ SUSP
40.0000 mg | Freq: Once | INTRAMUSCULAR | Status: DC
  Filled 2021-10-08: qty 1

## 2021-10-08 MED ORDER — HALOPERIDOL LACTATE 5 MG/ML IJ SOLN
5.0000 mg | Freq: Once | INTRAMUSCULAR | Status: AC
  Administered 2021-10-08: 5 mg via INTRAVENOUS
  Filled 2021-10-08: qty 1

## 2021-10-08 MED ORDER — WARFARIN SODIUM 2 MG PO TABS
2.0000 mg | ORAL_TABLET | Freq: Once | ORAL | Status: AC
  Administered 2021-10-08: 2 mg via ORAL
  Filled 2021-10-08: qty 1

## 2021-10-08 MED ORDER — TRIAMCINOLONE ACETONIDE 10 MG/ML IJ SUSP
40.0000 mg | Freq: Once | INTRAMUSCULAR | Status: AC
  Administered 2021-10-08: 40 mg via INTRA_ARTICULAR
  Filled 2021-10-08: qty 4

## 2021-10-08 NOTE — Plan of Care (Signed)
Pt refused care - VS, medications and request not to get out of bed w/out help since she is high fall risk. Pt became very agitated and grabbed my wrists and dug her nails in.  Her RN responded and pt began to kick and pushed the waker into her.  I called her son, who eventually came in to sit with her.  We called security and her RN reached out to on call NP and rec'd a 1x order for haldol 5 mg which was given.  Pt  began to calm down.  Walked her to the [facility] and got her back into the bed.

## 2021-10-08 NOTE — Progress Notes (Signed)
Physical Therapy Treatment Patient Details Name: NAZANIN KINNER MRN: [id number] DOB: 1934/04/17 Today's Date: 10/08/2021   History of Present Illness RANEEN JAFFER is a 87 y.o. female with medical history significant for Paroxysmal atrial fibrillation on warfarin, breast cancer, hypertension, mild cognitive impairment, who presents to the ED with concerns for confusion beyond her baseline. As well as a fall earlier in the day with pain to her right shoulder and coccyx    PT Comments    Patient alert agreeable to PT with some encouragement, family at bedside. Did display moderate pain signs/symptoms with any R shoulder movement. Sit <> stand with supervision, cued for hand placement throughout. She was able to ambulate ~153f with RW and CGA-supervision. Does tend to veer to the R and ambulate with feet outside of RW, able to correct with cueing. Returned to room with OT at end of session. The patient would benefit from further skilled PT intervention to continue to progress towards goals. Recommendation remains appropriate.      Recommendations for follow up therapy are one component of a multi-disciplinary discharge planning process, led by the attending physician.  Recommendations may be updated based on patient status, additional functional criteria and insurance authorization.  Follow Up Recommendations  Home health PT     Assistance Recommended at Discharge Intermittent Supervision/Assistance  Patient can return home with the following A little help with walking and/or transfers;A little help with bathing/dressing/bathroom   Equipment Recommendations  None recommended by PT    Recommendations for Other Services       Precautions / Restrictions Precautions Precautions: Fall Restrictions Weight Bearing Restrictions: No     Mobility  Bed Mobility               General bed mobility comments: received and left with OT on way to bathroom    Transfers Overall transfer  level: Needs assistance Equipment used: Rolling walker (2 wheels) Transfers: Sit to/from Stand Sit to Stand: Min guard           General transfer comment: cued for hand placement each time    Ambulation/Gait Ambulation/Gait assistance: Min guard, Supervision Gait Distance (Feet): 180 Feet Assistive device: Rolling walker (2 wheels) Gait Pattern/deviations: Step-through pattern, Drifts right/left, Trunk flexed       General Gait Details: Pt ambualtes well, however tends to go to the R side of the walker when ambulating. does not multi-task ( stops to talk, look around, interact, etc)   Stairs             Wheelchair Mobility    Modified Rankin (Stroke Patients Only)       Balance Overall balance assessment: Needs assistance Sitting-balance support: No upper extremity supported, Feet supported Sitting balance-Leahy Scale: Good     Standing balance support: No upper extremity supported, During functional activity Standing balance-Leahy Scale: Fair                              Cognition Arousal/Alertness: Awake/alert Behavior During Therapy: WFL for tasks assessed/performed, Impulsive Overall Cognitive Status: History of cognitive impairments - at baseline                                          Exercises      General Comments        Pertinent Vitals/Pain Pain Assessment Pain  Assessment: Faces Faces Pain Scale: Hurts even more Pain Location: R shoulder movement Pain Descriptors / Indicators: Grimacing, Guarding, Moaning, Sore Pain Intervention(s): Limited activity within patient's tolerance, Monitored during session, Repositioned    Home Living                          Prior Function            PT Goals (current goals can now be found in the care plan section) Progress towards PT goals: Progressing toward goals    Frequency    Min 2X/week      PT Plan Current plan remains appropriate     Co-evaluation              AM-PAC PT "6 Clicks" Mobility   Outcome Measure  Help needed turning from your back to your side while in a flat bed without using bedrails?: None Help needed moving from lying on your back to sitting on the side of a flat bed without using bedrails?: None Help needed moving to and from a bed to a chair (including a wheelchair)?: None Help needed standing up from a chair using your arms (e.g., wheelchair or bedside chair)?: None Help needed to walk in hospital room?: None Help needed climbing 3-5 steps with a railing? : A Little 6 Click Score: 23    End of Session Equipment Utilized During Treatment: Gait belt Activity Tolerance: Patient tolerated treatment well Patient left: Other (comment) (standing with OT, on way to bathroom) Nurse Communication: Mobility status PT Visit Diagnosis: Unsteadiness on feet (R26.81);Other abnormalities of gait and mobility (R26.89);Repeated falls (R29.6);Muscle weakness (generalized) (M62.81);History of falling (Z91.81);Difficulty in walking, not elsewhere classified (R26.2)     Time: 7342-8768 PT Time Calculation (min) (ACUTE ONLY): 15 min  Charges:  $Therapeutic Activity: 8-22 mins                     Lieutenant Diego PT, DPT 3:43 PM,10/08/21

## 2021-10-08 NOTE — Progress Notes (Signed)
[facility] Progress Note  Patient: Laura David    ERD:[id number]  DOA: 10/03/2021    Date of Service: the patient was seen and examined on 10/08/2021  Brief hospital course: Laura David is a 86 y.o. female with medical history significant for Paroxysmal atrial fibrillation on warfarin, breast cancer, hypertension, and dementia who presented to the emergency room on 9/29 for increased confusion which was found to be secondary to E. coli UTI.  Some improvement with confusion although patient continued to wax and wane.  Her home medicines were restarted on 10/2 which did help.  Patient found be complaining of significant right shoulder pain with MRI noting significant glenohumeral osteoarthritis and rotator cuff tears.  Orthopedic surgery consulted and after evaluation of patient, performed injection of right steroid joint.  Assessment and Plan: Assessment and Plan: * Senile dementia with delirium with behavioral disturbance ([place]) Patient with increased somnolence over the past 3 weeks.  This is felt to be secondary to UTI and has improved.  However, patient continued to have some persistent delirium acutely confused during hospitalization than at her baseline.  Follow-up lab work unremarkable.  Felt to be combination of hospital induced delirium (from and away from her home environment) as well as not being on her home medications of Namenda and Remeron, which were restarted 10/2.  Patient since then improved  E. coli UTI Pansensitive E. coli.  Final dose of IV Rocephin due early morning 10/4.  PAF (paroxysmal atrial fibrillation) (HCC) Subtherapeutic INR on admission.  Pharmacy managing Coumadin.  Now therapeutic.  Continue metoprolol.  Right shoulder pain MRI notes rotator cuff tears and significant glenohumeral arthritis.  Discussed with Dr. Roland Rack, orthopedic surgery, and after evaluation the patient and reviewing the films, performed right shoulder joint steroid  injection.  Dementia ([place]) Continue Remeron Delirium precautions --Get up during the day --Keep blinds open and lights on during daylight hours --Minimize the use of opioids/benzodiazepines --Reorient the patient frequently, provide easily visible clock and calendar --Provide sensory aids like glasses, hearing aids --Encourage ambulation, regular activities and visitors to maintain cognitive stimulation  --Patient would benefit from having family members at bedside to reinforce his orientation.   Fall at home, initial encounter History of falls with injury Ambulates with walker Son states patient does not use walker and she should and will hold onto things instead when she is walking.  Seen by PT and OT who are recommending home health.  No evidence of trauma on imaging however patient complaining of right shoulder pain.  See below.  Hypertension Continue amlodipine and metoprolol       Body mass index is 21.48 kg/m.        Consultants: Orthopedic surgery  Procedures: Steroid injection of right shoulder joint  Antimicrobials: IV Rocephin 9/30-10/4  Code Status: DNR   Subjective: Patient a little more appropriate and interactive, although still confused.  Complains of right shoulder pain  Objective: Vital signs were reviewed and unremarkable. Vitals:   10/08/21 0836 10/08/21 1555  BP: 129/75 106/65  Pulse: 61 87  Resp: 18 16  Temp: 97.6 F (36.4 C) 98.1 F (36.7 C)  SpO2: 99% 98%   No intake or output data in the 24 hours ending 10/08/21 1613  Filed Weights   10/03/21 2009  Weight: 49.9 kg   Body mass index is 21.48 kg/m.  Exam:  General: Oriented x2, mild distress from pain and anxiety HEENT: Normocephalic, atraumatic, mucous membranes are moist Cardiovascular: Irregular rhythm, rate controlled Respiratory: Clear  to auscultation bilaterally Abdomen: Soft, nontender, nondistended, positive bowel sounds Musculoskeletal: No clubbing or cyanosis  or edema.  Right shoulder limited in range from approximately downward position to about 90 degrees maximum Skin: No skin breaks, tears or lesions Psychiatry: Underlying dementia, anxious Neurology: No focal deficits  Data Reviewed: Normal procalcitonin thyroid function panel.  INR 2.7  Disposition:  Status is: Inpatient Remains inpatient appropriate because:  -Monitoring to ensure tolerance of steroid injection -Completes IV antibiotic course    Anticipated discharge date: 10/4  Family Communication: Son at the bedside DVT Prophylaxis:   Therapeutic on Coumadin    Author: Annita Brod ,MD 10/08/2021 4:13 PM  To reach On-call, see care teams to locate the attending and reach out via [email or web address]. Between 7PM-7AM, please contact night-coverage If you still have difficulty reaching the attending provider, please page the [facility] (Director on Call) for [facility] on amion for assistance.

## 2021-10-08 NOTE — Progress Notes (Signed)
Laura David for Warfarin  Indication: atrial fibrillation   Allergies  Allergen Reactions   Flagyl [Metronidazole] Nausea Only   Pacerone [Amiodarone]     Eye deposits   Ramipril Cough    Patient Measurements: Height: 5' (152.4 cm) Weight: 49.9 kg (110 lb) IBW/kg (Calculated) : 45.5  Vital Signs: Temp: 97.4 F (36.3 C) (10/03 0553) BP: 129/69 (10/03 0553) Pulse Rate: 59 (10/03 0553)  Labs: Recent Labs    10/06/21 0612 10/07/21 0548 10/07/21 1233 10/08/21 0450  HGB  --   --  14.9 13.1  HCT  --   --  45.7 40.6  PLT  --   --  237 223  LABPROT 26.3* 29.4*  --  28.5*  INR 2.4* 2.8*  --  2.7*  CREATININE  --   --  0.90  --      Estimated Creatinine Clearance: 32.8 mL/min (by C-G formula based on SCr of 0.9 mg/dL).   Medical History: Past Medical History:  Diagnosis Date   Asthma    Breast cancer ([place]) 2010   RIGHT   Cancer ([place])    basel cell, s/p MOH'S/DR. 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   Pt is confused so cannot answer when she last had warfarin.   home warfarin dose: 2 MG tablet every Sunday/Wednesday and 1 mg all other days  Goal of Therapy:  INR 2-3  Date INR Dose 9/29 1.7 '2mg'$  9/30 1.9 '2mg'$  10/1 2.4 '1mg'$  10/2 2.8 -- 10/3 2.7 2 mg  Drug interactions: ceftriaxone > cephalexin   Plan: INR stabilizing after reducing and then holding dose Will give home dose of 2 mg x 1 today Follow up PT-INR and CBC tomorrow AM    Glean Salvo, PharmD Clinical Pharmacist  10/08/2021 7:32 AM

## 2021-10-08 NOTE — Consult Note (Signed)
ORTHOPAEDIC CONSULTATION  REQUESTING PHYSICIAN: Annita Brod, MD  Chief Complaint:   Right shoulder pain.  History of Present Illness: Laura David is a 87 y.o. female with multiple medical problems including a history of breast cancer, hypertension, paroxysmal atrial fibrillation, osteopenia, hyperlipidemia, and gastroesophageal reflux disease who apparently lost her balance and fell on 10/03/2021.  She presented to the emergency room where x-rays of her shoulder and back were unremarkable for fractures.  However, because of pain and concerns for confusion, possibly from a UTI, the patient has been admitted for further evaluation and treatment.  The patient notes that her shoulder has caused discomfort for many years but worsened significantly following her fall last week.  She has pain with any attempt at raising her arm even to shoulder level.  She denies any numbness or paresthesias down her arm to her hand, and denies any fevers or chills.  Past Medical History:  Diagnosis Date   Asthma    Breast cancer ([place]) 2010   RIGHT   Cancer ([place])    basel cell, s/p MOH'S/DR. 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   Past Surgical History:  Procedure Laterality Date   ABDOMINAL HYSTERECTOMY  1980   D/T FIBROIDS   BLADDER SUSPENSION  1983   BREAST BIOPSY Left    benign    BREAST SURGERY Right 2010   MASTECTOMY/CA   COLONOSCOPY  2013   NORMAL   MASTECTOMY Right 2010   MOLES REMOVED  12/05/10   FROM [facility]   ORIF WRIST FRACTURE Left 01/13/2020   Procedure: OPEN REDUCTION INTERNAL FIXATION (ORIF) WRIST FRACTURE;  Surgeon: Hessie Knows, MD;  Location: [facility] ORS;  Service: Orthopedics;  Laterality: Left;   Social  History   Socioeconomic History   Marital status: Widowed    Spouse name: Not on file   Number of children: Not on file   Years of education: Not on file   Highest education level: Not on file  Occupational History   Not on file  Tobacco Use   Smoking status: Never   Smokeless tobacco: Never  Substance and Sexual Activity   Alcohol use: No   Drug use: Not Currently   Sexual activity: Not on file  Other Topics Concern   Not on file  Social History Narrative   Not on file   Social Determinants of Health   Financial Resource Strain: Not on file  Food Insecurity: No Food Insecurity (10/04/2021)   Hunger Vital Sign    Worried About Running Out of Food in the Last Year: Never true    Ran Out of Food in the Last Year: Never true  Transportation Needs: No Transportation Needs (10/04/2021)   PRAPARE - Hydrologist (Medical): No    Lack of Transportation (Non-Medical): No  Physical Activity: Not on file  Stress: Not on file  Social Connections: Not on file   Family History  Problem Relation Age of Onset   Heart disease Mother    Stroke Mother    Cancer Father        LUNG   Diabetes Brother    Heart disease Brother    Hyperlipidemia Brother    Hypertension Brother  Diabetes Brother    Heart disease Brother    Hyperlipidemia Brother    Hypertension Brother    Breast cancer Neg Hx    Allergies  Allergen Reactions   Flagyl [Metronidazole] Nausea Only   Pacerone [Amiodarone]     Eye deposits   Ramipril Cough   Prior to Admission medications   Medication Sig Start Date End Date Taking? Authorizing Provider  alendronate (FOSAMAX) 70 MG tablet Take 70 mg by mouth once a week. (Sundays) 10/27/19  Yes [provider]  amLODipine (NORVASC) 2.5 MG tablet Take 2.5 mg by mouth daily. 08/30/21 08/30/22 Yes [provider]  atorvastatin (LIPITOR) 10 MG tablet Take 10 mg by mouth at bedtime. 11/30/19  Yes [provider]   cephALEXin (KEFLEX) 500 MG capsule Take 1 capsule (500 mg total) by mouth 3 (three) times daily for 3 days. 10/06/21 10/09/21 Yes Sharen Hones, MD  escitalopram (LEXAPRO) 10 MG tablet Take 10 mg by mouth daily. 09/16/21  Yes [provider]  memantine (NAMENDA) 10 MG tablet Take 10 mg by mouth in the morning and at bedtime. 08/06/21  Yes [provider]  metoprolol tartrate (LOPRESSOR) 25 MG tablet Take 25 mg by mouth 2 (two) times daily.   Yes [provider]  mirtazapine (REMERON) 7.5 MG tablet Take 7.5 mg by mouth at bedtime. 09/24/21  Yes [provider]  warfarin (COUMADIN) 2 MG tablet Take 2 mg by mouth daily. TAKE AS DIRECTED.   Yes [provider]  amLODipine (NORVASC) 5 MG tablet Take 5 mg by mouth daily. Patient not taking: Reported on 10/04/2021    [provider]   MR SHOULDER RIGHT WO CONTRAST  Result Date: 10/07/2021 CLINICAL DATA:  Shoulder pain.  Stress fracture suspected. EXAM: MRI OF THE RIGHT SHOULDER WITHOUT CONTRAST TECHNIQUE: Multiplanar, multisequence MR imaging of the shoulder was performed. No intravenous contrast was administered. COMPARISON:  Right shoulder radiographs 10/03/2021 FINDINGS: Despite efforts by the technologist and patient, motion artifact is present on today's exam and could not be eliminated. This reduces exam sensitivity and specificity. Rotator cuff: There is a full-thickness tear of the anterior supraspinatus tendon footprint measuring up to 16 mm in AP dimension (sagittal series 8, image 8) and with up to 11 mm tendon retraction. There is mild-to-moderate subcortical cystic change focally within the humeral head deep to this tendon tear. Mild posterior supraspinatus and anterior infraspinatus intermediate T2 signal tendinosis. Punctate fluid bright superior subscapularis tendon partial-thickness tear. The teres minor is intact. Muscles: Severe supraspinatus and moderate infraspinatus muscle atrophy. Biceps long  head: Mild proximal long head of the biceps intermediate T2 signal tendinosis. Acromioclavicular Joint: There are mild degenerative changes of the acromioclavicular joint including joint space narrowing, subchondral marrow edema, and peripheral osteophytosis. Type II acromion. Mild fluid within the subacromial/subdeltoid bursa. Glenohumeral Joint: Mild glenohumeral joint effusion. Severe glenohumeral osteoarthritis including full-thickness cartilage loss diffusely, mild high-grade glenoid fossa and humeral head cortical flattening/remodeling, an anterior glenoid degenerative subchondral cyst measuring up to 2.0 cm in craniocaudal dimension and 1.3 x 1.1 cm in transverse dimension (sagittal image 17 and axial image 12), large posterior glenoid degenerative osteophytes (which may be chronically separated from the glenoid rim), and a large inferior humeral head-neck junction degenerative osteophytes. Mild anterior inferior humeral head subchondral marrow edema. Labrum: The anterior glenoid labrum is not visualized. High-grade degenerative tearing of the superior glenoid labrum. Bones:  No acute fracture. Other: None. IMPRESSION: 1. Full-thickness tear of the anterior supraspinatus tendon footprint measuring  up to 16 mm in AP dimension. Severe supraspinatus and moderate infraspinatus muscle atrophy. 2. Punctate partial-thickness tear of the distal superior subscapularis tendon. 3. Mild proximal long head of the biceps tendinosis. 4. Mild degenerative changes of the acromioclavicular joint. 5. Severe glenohumeral osteoarthritis. 6. No acute fracture is seen. Electronically Signed   By: Yvonne Kendall M.D.   On: 10/07/2021 16:09    Positive ROS: All other systems have been reviewed and were otherwise negative with the exception of those mentioned in the HPI and as above.  Physical Exam: General:  Alert, no acute distress Psychiatric:  Patient appears to be competent for consent with normal mood and affect    Cardiovascular:  No pedal edema Respiratory:  No wheezing, non-labored breathing GI:  Abdomen is soft and non-tender Skin:  No lesions in the area of chief complaint Neurologic:  Sensation intact distally Lymphatic:  No axillary or cervical lymphadenopathy  Orthopedic Exam:  Orthopedic examination is limited to the right shoulder and upper extremity.  Skin inspection around the right shoulder is notable for mild swelling as well as a possible small effusion, but otherwise is unremarkable.  No erythema, ecchymosis, abrasions, or other skin abnormalities are identified.  She has minimal tenderness to palpation around the shoulder.  She has more severe pain with any attempted active or passive motion of the shoulder.  Actively, she is able to forward flex only to about 30 to 40 degrees and abduct to 20 degrees before stopping due to pain.  Passively, she can tolerate forward flexion to 90 degrees with pain and crepitance experienced.  Grossly, she is neurovascularly intact to the right upper extremity and hand.  X-rays:  Recent x-rays of the right shoulder are available for review and have been reviewed by myself.  These films demonstrate advanced degenerative changes of the glenohumeral joint as manifest by humeral head flattening, joint space narrowing, and a moderate-sized inferior humeral osteophyte.  The subacromial space is mildly decreased.  No fractures, lytic lesions, or other acute bony abnormalities are identified.  A recent MRI scan of the right shoulder also is available for review and has been reviewed by myself.  The findings are as described above.  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  Plan: The treatment options have been discussed with the patient.  The patient is quite frustrated by her symptoms and functional limitations, and agrees to receiving a steroid injection.  After obtaining verbal consent, the  right shoulder is injected sterilely using a solution of 5 cc of Kenalog-10 (50 mg) and 5 cc of 0.5% plain Sensorcaine.  The patient tolerated the procedure well.  She may continue to receive pain medication as deemed appropriate, and to have ice applied to the shoulder as necessary for comfort.  She may gradually resume her normal daily activities as symptoms permit.  Thank you for asking me to participate in the care of this most delightful woman.  I will be happy to follow her with you.   Pascal Lux, MD  Beeper #:  [phone]  10/08/2021 3:54 PM

## 2021-10-08 NOTE — Progress Notes (Signed)
PT Cancellation Note  Patient Details Name: Laura David MRN: [id number] DOB: 11-29-1934   Cancelled Treatment:    Reason Eval/Treat Not Completed: Other (comment). MD at bedside, PT to re-attempt as able.   Lieutenant Diego PT, DPT 2:47 PM,10/08/21

## 2021-10-08 NOTE — Progress Notes (Signed)
Occupational Therapy Treatment Patient Details Name: Laura David MRN: [id number] DOB: 01/09/1934 Today's Date: 10/08/2021   History of present illness Laura David is a 87 y.o. female with medical history significant for Paroxysmal atrial fibrillation on warfarin, breast cancer, hypertension, mild cognitive impairment, who presents to the ED with concerns for confusion beyond her baseline. As well as a fall earlier in the day with pain to her right shoulder and coccyx   OT comments  Pt seen for OT tx this date. Pt ambulating back to room with PT. OT assisted pt to bathroom requiring CGA and VC for RW mgt/safety. Pt's R shoulder causing significant pain with any movement, requiring CGA for toilet transfers and CGA in standing for pericare. Pt seated EOB briefly for ortho MD, then assisted back to recliner with CGA + RW. Pt slightly labile during session but able to redirect with cues. Pt continues to benefit from skilled OT services. Continue to recommend [place].    Recommendations for follow up therapy are one component of a multi-disciplinary discharge planning process, led by the attending physician.  Recommendations may be updated based on patient status, additional functional criteria and insurance authorization.    Follow Up Recommendations  Home health OT    Assistance Recommended at Discharge Intermittent Supervision/Assistance  Patient can return home with the following  Assistance with cooking/housework;Direct supervision/assist for medications management;Help with stairs or ramp for entrance;A little help with walking and/or transfers;A little help with bathing/dressing/bathroom   Equipment Recommendations  BSC/3in1    Recommendations for Other Services      Precautions / Restrictions Precautions Precautions: Fall Restrictions Weight Bearing Restrictions: No       Mobility Bed Mobility               General bed mobility comments: NT, amb with PT at start of  session, seated in recliner at end of session    Transfers Overall transfer level: Needs assistance Equipment used: Rolling walker (2 wheels) Transfers: Sit to/from Stand Sit to Stand: Min guard                 Balance Overall balance assessment: Needs assistance Sitting-balance support: No upper extremity supported, Feet supported Sitting balance-Leahy Scale: Fair Sitting balance - Comments: unsupported seated EOB, pain limited   Standing balance support: Single extremity supported, During functional activity, Reliant on assistive device for balance Standing balance-Leahy Scale: Fair Standing balance comment: no LOB during standing pericare                           ADL either performed or assessed with clinical judgement   ADL                                         General ADL Comments: CGA for toilet transfer and for standing pericare. R should causing pain limiting her independence with tasks.    Extremity/Trunk Assessment              Vision       Perception     Praxis      Cognition Arousal/Alertness: Awake/alert Behavior During Therapy: WFL for tasks assessed/performed, Impulsive Overall Cognitive Status: History of cognitive impairments - at baseline  General Comments: sweet, slightly labile during session but redirectable, VC for RW mgt/safety        Exercises      Shoulder Instructions       General Comments      Pertinent Vitals/ Pain       Pain Assessment Pain Assessment: Faces Faces Pain Scale: Hurts whole lot Pain Location: R shoulder with movement Pain Descriptors / Indicators: Grimacing, Guarding, Moaning, Sore Pain Intervention(s): Limited activity within patient's tolerance, Monitored during session, Repositioned, Other (comment) (MD provided shot at end of session)  Home Living                                          Prior  Functioning/Environment              Frequency  Min 2X/week        Progress Toward Goals  OT Goals(current goals can now be found in the care plan section)  Progress towards OT goals: Progressing toward goals  Acute Rehab OT Goals Patient Stated Goal: to improve pain OT Goal Formulation: With patient/family Time For Goal Achievement: 10/19/21 Potential to Achieve Goals: Good  Plan Discharge plan remains appropriate;Frequency remains appropriate    Co-evaluation                 AM-PAC OT "6 Clicks" Daily Activity     Outcome Measure   Help from another person eating meals?: None Help from another person taking care of personal grooming?: A Little Help from another person toileting, which includes using toliet, bedpan, or urinal?: A Little Help from another person bathing (including washing, rinsing, drying)?: A Little Help from another person to put on and taking off regular upper body clothing?: A Little Help from another person to put on and taking off regular lower body clothing?: A Little 6 Click Score: 19    End of Session Equipment Utilized During Treatment: Gait belt;Rolling walker (2 wheels)  OT Visit Diagnosis: History of falling (Z91.81);Other abnormalities of gait and mobility (R26.89)   Activity Tolerance Patient tolerated treatment well;Patient limited by pain   Patient Left in chair;with call bell/phone within reach;with chair alarm set   Nurse Communication Mobility status        Time: 2094-7096 OT Time Calculation (min): 23 min  Charges: OT General Charges $OT Visit: 1 Visit OT Treatments $Self Care/Home Management : 23-37 mins  Ardeth Perfect., MPH, MS, OTR/L ascom [phone] 10/08/21, 4:07 PM

## 2021-10-08 NOTE — Progress Notes (Signed)
OT Cancellation Note  Patient Details Name: Laura David MRN: [id number] DOB: 28-Apr-1934   Cancelled Treatment:    Reason Eval/Treat Not Completed: Other (comment). Pt ambulating back to EOB with nurse tech and MD in room with family. Will re-attempt OT tx at later date/time.   Ardeth Perfect., MPH, MS, OTR/L ascom [phone] 10/08/21, 2:50 PM
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
## 2021-10-09 DIAGNOSIS — Y92009 Unspecified place in unspecified non-institutional (private) residence as the place of occurrence of the external cause: Secondary | ICD-10-CM

## 2021-10-09 DIAGNOSIS — W19XXXA Unspecified fall, initial encounter: Secondary | ICD-10-CM

## 2021-10-09 LAB — CBC
HCT: 41.7 % (ref 36.0–46.0)
Hemoglobin: 13.3 g/dL (ref 12.0–15.0)
MCH: 30.2 pg (ref 26.0–34.0)
MCHC: 31.9 g/dL (ref 30.0–36.0)
MCV: 94.8 fL (ref 80.0–100.0)
Platelets: 203 10*3/uL (ref 150–400)
RBC: 4.4 MIL/uL (ref 3.87–5.11)
RDW: 13.4 % (ref 11.5–15.5)
WBC: 7.7 10*3/uL (ref 4.0–10.5)
nRBC: 0 % (ref 0.0–0.2)

## 2021-10-09 LAB — PROTIME-INR
INR: 3 — ABNORMAL HIGH (ref 0.8–1.2)
Prothrombin Time: 30.5 seconds — ABNORMAL HIGH (ref 11.4–15.2)

## 2021-10-09 IMAGING — DX DG WRIST 2V*L*
2 series · 2 of 2 positions shown · non-contrast
Comparison: 01/09/2020

CLINICAL DATA: ORIF distal LEFT radial fracture

EXAM:
LEFT WRIST - 2 VIEW

[wrist ap]
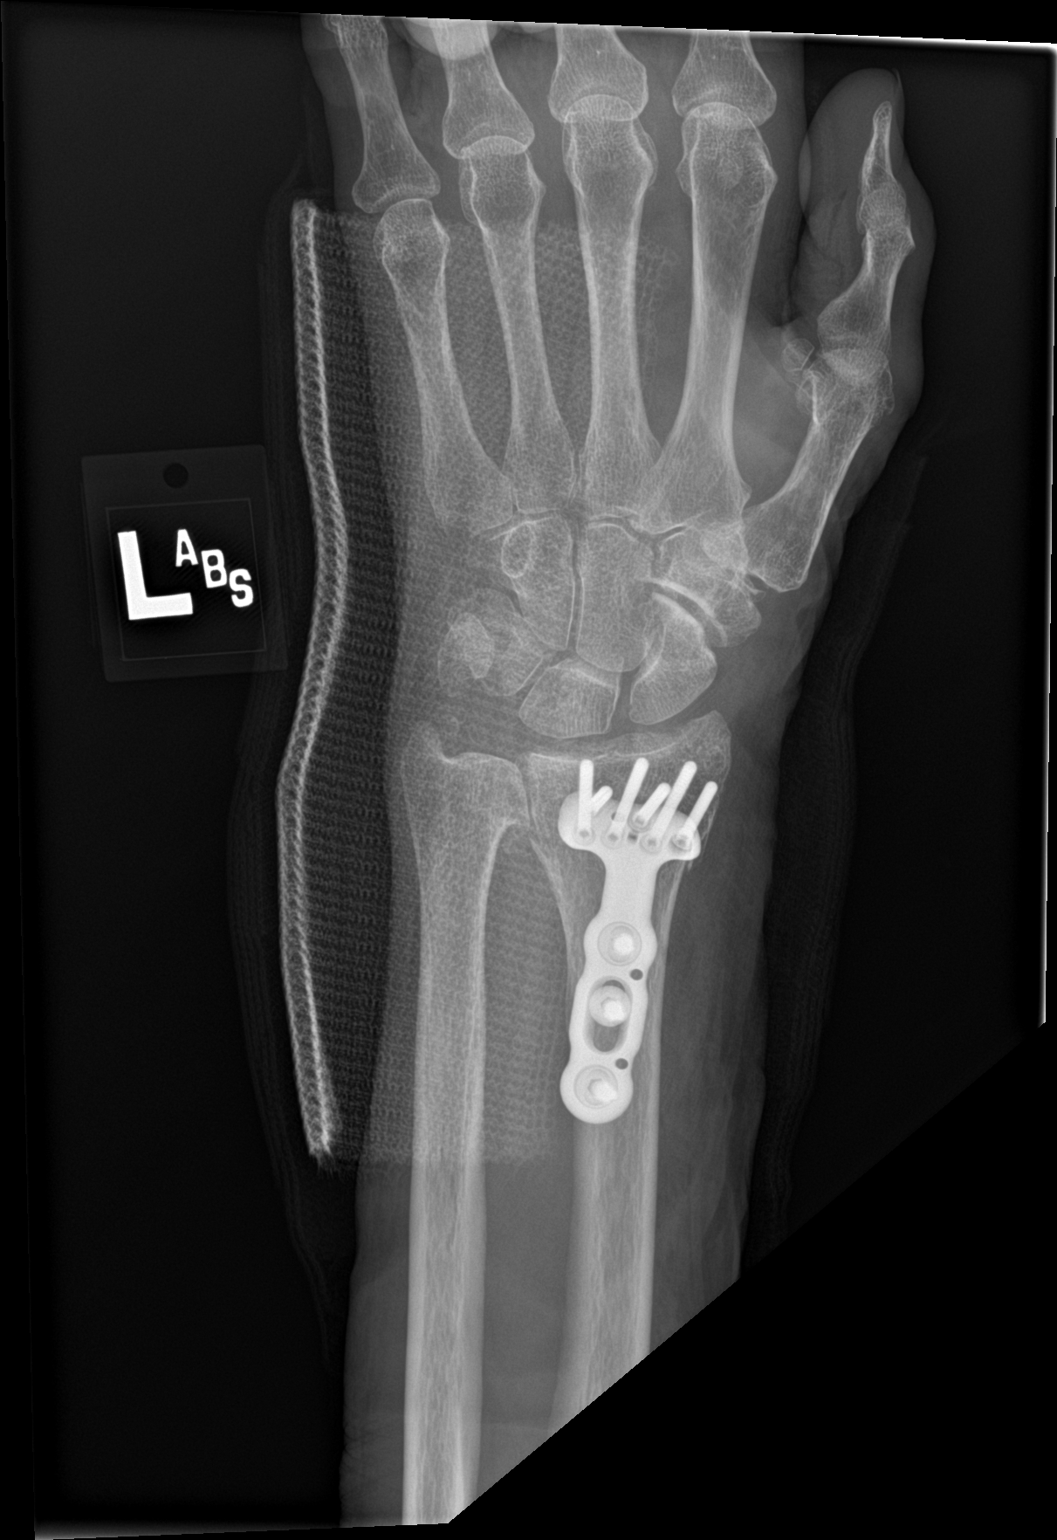

[wrist lat]
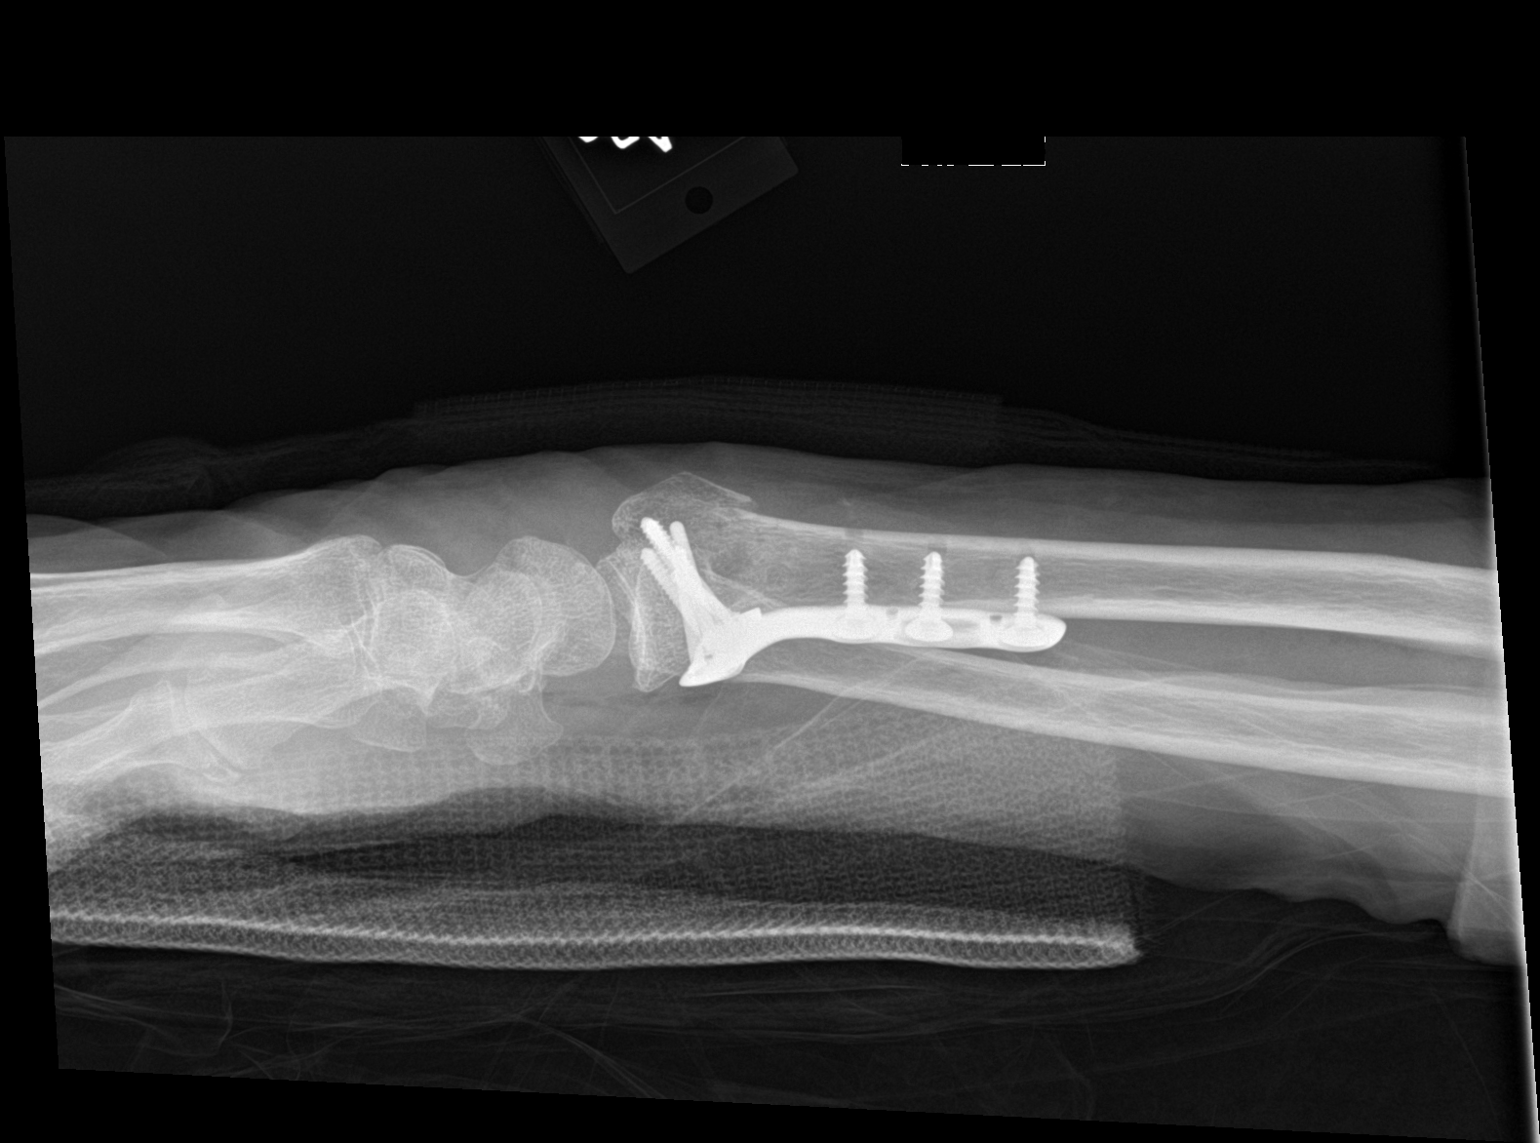

[2 of 2 positions shown; findings below may reference images not displayed]

FINDINGS: Osseous demineralization.

Improved alignment of distal LEFT radial metaphyseal fracture post
ORIF.

Volar plate and screws identified.

Regional soft tissue swelling.

No additional fracture, dislocation, or bone destruction.

Distal radioulnar joint degenerative changes are seen as well as
degenerative changes at the radial border of the carpus.
IMPRESSION: Post ORIF of distal LEFT radial metaphyseal fracture.

## 2021-10-09 MED ORDER — POTASSIUM CHLORIDE CRYS ER 20 MEQ PO TBCR
20.0000 meq | EXTENDED_RELEASE_TABLET | Freq: Once | ORAL | Status: AC
  Administered 2021-10-09: 20 meq via ORAL
  Filled 2021-10-09: qty 1

## 2021-10-09 MED ORDER — OXYCODONE-ACETAMINOPHEN 5-325 MG PO TABS
1.0000 | ORAL_TABLET | Freq: Four times a day (QID) | ORAL | Status: DC | PRN
  Administered 2021-10-09 – 2021-10-10 (×3): 1 via ORAL
  Filled 2021-10-09 (×3): qty 1

## 2021-10-09 NOTE — Progress Notes (Signed)
[place] for Warfarin  Indication: atrial fibrillation   Allergies  Allergen Reactions   Flagyl [Metronidazole] Nausea Only   Pacerone [Amiodarone]     Eye deposits   Ramipril Cough    Patient Measurements: Height: 5' (152.4 cm) Weight: 49.9 kg (110 lb) IBW/kg (Calculated) : 45.5  Vital Signs:    Labs: Recent Labs    10/07/21 0548 10/07/21 1233 10/07/21 1233 10/08/21 0450 10/09/21 0414  HGB  --  14.9   < > 13.1 13.3  HCT  --  45.7  --  40.6 41.7  PLT  --  237  --  223 203  LABPROT 29.4*  --   --  28.5* 30.5*  INR 2.8*  --   --  2.7* 3.0*  CREATININE  --  0.90  --   --   --    < > = values in this interval not displayed.     Estimated Creatinine Clearance: 32.8 mL/min (by C-G formula based on SCr of 0.9 mg/dL).   Medical History: Past Medical History:  Diagnosis Date   Asthma    Breast cancer ([place]) 2010   RIGHT   Cancer ([place])    basel cell, s/p MOH'S/DR. 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  home warfarin dose: 2 MG tablet every Sunday/Wednesday and 1 mg all other days  Goal of Therapy:  INR 2-3  Date INR Dose 9/29 1.7 '2mg'$  9/30 1.9 '2mg'$  10/1 2.4 '1mg'$  10/2 2.8 -- 10/3 2.7 2 mg 10/4 3.0 --  Drug interactions: ceftriaxone > cephalexin   Plan: INR increase by +0.3 after giving 2 mg dose today. Will hold dose for today Follow up PT-INR and CBC tomorrow AM   Glean Salvo, PharmD Clinical Pharmacist  10/09/2021 7:18 AM

## 2021-10-09 NOTE — Plan of Care (Signed)
  Problem: Nutrition: Goal: Adequate nutrition will be maintained Outcome: Progressing   Problem: Coping: Goal: Level of anxiety will decrease Outcome: Progressing   Problem: Elimination: Goal: Will not experience complications related to bowel motility Outcome: Progressing   Problem: Pain Managment: Goal: General experience of comfort will improve Outcome: Progressing   Problem: Safety: Goal: Ability to remain free from injury will improve Outcome: Progressing   

## 2021-10-09 NOTE — Progress Notes (Signed)
Patient ID: Laura David, female   DOB: [birth year], 87 y.o.   MRN: [id number]  Subjective: The patient notes little if any change in her right shoulder symptoms as compared to yesterday.  She continues to complain of moderate to severe pain in her shoulder, and does not feel that the injection provided any substantial relief of her symptoms.   Objective: Vital signs in last 24 hours: Temp:  [98.1 F (36.7 C)-98.5 F (36.9 C)] 98.5 F (36.9 C) (10/04 0724) Pulse Rate:  [87] 87 (10/04 0724) Resp:  [16-18] 18 (10/04 0724) BP: (106-130)/(65-66) 130/66 (10/04 0724) SpO2:  [97 %-98 %] 97 % (10/04 0724)  Intake/Output from previous day: No intake/output data recorded. Intake/Output this shift: Total I/O In: 600 [P.O.:600] Out: -   Recent Labs    10/07/21 1233 10/08/21 0450 10/09/21 0414  HGB 14.9 13.1 13.3   Recent Labs    10/08/21 0450 10/09/21 0414  WBC 5.9 7.7  RBC 4.35 4.40  HCT 40.6 41.7  PLT 223 203   Recent Labs    10/07/21 1233  NA 141  K 3.4*  CL 105  CO2 27  BUN 33*  CREATININE 0.90  GLUCOSE 70  CALCIUM 9.8   Recent Labs    10/08/21 0450 10/09/21 0414  INR 2.7* 3.0*    Physical Exam: Orthopedic examination again is limited to the right upper extremity and hand.  The findings are unchanged as compared to yesterday.  She continues to complain of mild to moderate tenderness to palpation diffusely around the shoulder, as well as more severe pain with any attempted active or passive motion of the shoulder.  She remains neurovascularly intact to the right upper extremity and hand.  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   Plan: The treatment options are reviewed with the patient.  Given the severity of her pain, she may have an element of a hemarthrosis contributing to her discomfort.  After discussing the treatment options with her, we will try to give the injection another day to  see how much it might benefit her.  If it does not, we may try a second injection into the glenohumeral joint itself to see if this is any more beneficial.  Meanwhile, the patient may continue to receive pain medication as deemed appropriate from a medical standpoint, and may continue to be mobilized with physical therapy as symptoms permit.   [name] 10/09/2021, 2:44 PM

## 2021-10-09 NOTE — Progress Notes (Signed)
Mobility Specialist - Progress Note   10/09/21 1056  Mobility  Activity Ambulated with assistance in room  Activity Response Tolerated well  Distance Ambulated (ft) 10 ft  $Mobility charge 1 Mobility  Level of Assistance Standby assist, set-up cues, supervision of patient - no hands on  Assistive Device Front wheel walker   Pt sitting in restroom on RA upon arrival. PT STS and ambulates to recliner SBA. Pt needs VC for safety awareness and RW management. Pt tends to push RW ahead of them and stand to right of RW. Pt returns to recliner with needs in reach, family in room, and chair alarm set.   Gretchen Short  Mobility Specialist  10/09/21 10:58 AM

## 2021-10-09 NOTE — Progress Notes (Signed)
Physical Therapy Treatment Patient Details Name: Laura David MRN: [id number] DOB: 1934/05/26 Today's Date: 10/09/2021   History of Present Illness Laura David is a 87 y.o. female with medical history significant for Paroxysmal atrial fibrillation on warfarin, breast cancer, hypertension, mild cognitive impairment, who presents to the ED with concerns for confusion beyond her baseline. As well as a fall earlier in the day with pain to her right shoulder and coccyx    PT Comments    Patient alert ,agreeable to PT and up with RN upon entrance. Pt requested to use bathroom and then return to bed for a nap. She ambulated ~54f total, still tended to keep RW forward outside BOS and take very small, shuffled steps. No LOB noted and minimally able to correct with cueing. Some improvement in RUE movement with functional tasks today, pt still references significant shoulder pain. Pericare in sitting modI, and able to don/doff underwear in standing, CGA without UE support. Returned to supine with supervision, all needs in reach. The patient would benefit from further skilled PT intervention to continue to progress towards goals. Recommendation remains appropriate.     Recommendations for follow up therapy are one component of a multi-disciplinary discharge planning process, led by the attending physician.  Recommendations may be updated based on patient status, additional functional criteria and insurance authorization.  Follow Up Recommendations  Home health PT     Assistance Recommended at Discharge Intermittent Supervision/Assistance  Patient can return home with the following A little help with walking and/or transfers;A little help with bathing/dressing/bathroom   Equipment Recommendations  None recommended by PT    Recommendations for Other Services       Precautions / Restrictions Precautions Precautions: Fall Restrictions Weight Bearing Restrictions: No     Mobility  Bed  Mobility Overal bed mobility: Modified Independent             General bed mobility comments: pt up with RN upon PT entrance    Transfers Overall transfer level: Needs assistance Equipment used: Rolling walker (2 wheels) Transfers: Sit to/from Stand Sit to Stand: Min guard           General transfer comment: cued for hand placement for standard commode    Ambulation/Gait Ambulation/Gait assistance: Min guard, Supervision Gait Distance (Feet): 30 Feet Assistive device: Rolling walker (2 wheels)         General Gait Details: very small, shuffled steps no LOB, cued for RW positioning   Stairs             Wheelchair Mobility    Modified Rankin (Stroke Patients Only)       Balance Overall balance assessment: Needs assistance Sitting-balance support: Feet supported Sitting balance-Leahy Scale: Good Sitting balance - Comments: able to perform pericare   Standing balance support: Single extremity supported, During functional activity, Reliant on assistive device for balance Standing balance-Leahy Scale: Fair Standing balance comment: able to don/doff underwear, CGA                            Cognition Arousal/Alertness: Awake/alert Behavior During Therapy: WFL for tasks assessed/performed, Impulsive Overall Cognitive Status: History of cognitive impairments - at baseline                                          Exercises      General  Comments        Pertinent Vitals/Pain Pain Assessment Pain Assessment: Faces Faces Pain Scale: Hurts a little bit Pain Location: R shoulder with movement Pain Descriptors / Indicators: Grimacing, Guarding Pain Intervention(s): Limited activity within patient's tolerance, Monitored during session, Repositioned    Home Living                          Prior Function            PT Goals (current goals can now be found in the care plan section) Progress towards PT goals:  Progressing toward goals    Frequency    Min 2X/week      PT Plan Current plan remains appropriate    Co-evaluation              AM-PAC PT "6 Clicks" Mobility   Outcome Measure  Help needed turning from your back to your side while in a flat bed without using bedrails?: None Help needed moving from lying on your back to sitting on the side of a flat bed without using bedrails?: None Help needed moving to and from a bed to a chair (including a wheelchair)?: None Help needed standing up from a chair using your arms (e.g., wheelchair or bedside chair)?: None Help needed to walk in hospital room?: None Help needed climbing 3-5 steps with a railing? : A Little 6 Click Score: 23    End of Session Equipment Utilized During Treatment: Gait belt Activity Tolerance: Patient tolerated treatment well Patient left: Other (comment) (standing with OT, on way to bathroom) Nurse Communication: Mobility status PT Visit Diagnosis: Unsteadiness on feet (R26.81);Other abnormalities of gait and mobility (R26.89);Repeated falls (R29.6);Muscle weakness (generalized) (M62.81);History of falling (Z91.81);Difficulty in walking, not elsewhere classified (R26.2)     Time: 5038-8828 PT Time Calculation (min) (ACUTE ONLY): 15 min  Charges:  $Therapeutic Activity: 8-22 mins                     Laura David PT, DPT 2:52 PM,10/09/21

## 2021-10-09 NOTE — Progress Notes (Signed)
Mobility Specialist - Progress Note   10/09/21 1052  Mobility  Activity Ambulated with assistance in hallway;Stood at bedside;Dangled on edge of bed  Activity Response Tolerated well  Distance Ambulated (ft) 160 ft  $Mobility charge 1 Mobility  Level of Assistance Standby assist, set-up cues, supervision of patient - no hands on  Assistive Device Front wheel walker   Pt sitting in recliner on RA upon arrival. Pt STS and ambulates 1 lap around NS SBA. Pt has no LOB but does complain of some SOB during ambulation. RN notified. Pt needs consistent VC for safety awareness and RW management. Pt is left in restroom with family and educated on how to call for assistance back when finished.   Gretchen Short  Mobility Specialist  10/09/21 10:54 AM

## 2021-10-09 NOTE — Progress Notes (Addendum)
Spoke with patient's son to confirm possible discharge for tomorrow. He was advised [facility] would be in contact with him. He will tranwport patient home.

## 2021-10-09 NOTE — Progress Notes (Signed)
PROGRESS NOTE    Laura David  KGU:[id number] DOB: 1934-08-10 DOA: 10/03/2021 PCP: [name], MD   Assessment & Plan:   Principal Problem:   Senile dementia with delirium with behavioral disturbance ([place]) Active Problems:   E. coli UTI   PAF (paroxysmal atrial fibrillation) ([place])   Hypertension   Fall at home, initial encounter   Anticoagulated on Coumadin   Right shoulder pain  Assessment and Plan: Dementia: with delirium with behavioral disturbance. Continue on home dose of remeron, namenda   E. coli UTI: completed abx course today    PAF: continue on metoprolol, coumadin. Pharmacy to dose and monitor coumadin/INR    Right shoulder pain: MRI notes rotator cuff tears and significant glenohumeral arthritis. S/p right steroid shoulder joint injection as per ortho surg. Started on percocet prn   Fall at home: PT/OT recs HH. Hx of falls w/ injury    HTN: continue on metoprolol, amlodipine      DVT prophylaxis: coumadin  Code Status: DNR Family Communication: discussed pt's care w/ pt's son, Lennette Bihari, and answered his questions  Disposition Plan: d/c back home w/ home health   Level of care: Med-Surg  Status is: Inpatient Remains inpatient appropriate because: d/c back home tomorrow. CM to speak w/ pt's son in regards to resources for private home health aides     Consultants:    Procedures:   Antimicrobials:   Subjective: Pt c/o shoulder pain   Objective: Vitals:   10/08/21 0750 10/08/21 0836 10/08/21 1555 10/09/21 0724  BP:  129/75 106/65 130/66  Pulse: 60 61 87 87  Resp:  '18 16 18  '$ Temp:  97.6 F (36.4 C) 98.1 F (36.7 C) 98.5 F (36.9 C)  TempSrc:  Oral  Oral  SpO2:  99% 98% 97%  Weight:      Height:        Intake/Output Summary (Last 24 hours) at 10/09/2021 1416 Last data filed at 10/09/2021 0930 Gross per 24 hour  Intake 480 ml  Output --  Net 480 ml   Filed Weights   10/03/21 2009  Weight: 49.9 kg    Examination:  General  exam: Appears calm and comfortable  Respiratory system: Clear to auscultation. Respiratory effort normal. Cardiovascular system: S1 & S2 +. No  rubs, gallops or clicks.  Gastrointestinal system: Abdomen is nondistended, soft and nontender. Normal bowel sounds heard. Central nervous system: Alert and awake. Moves all extremities  Psychiatry: Judgement and insight appears poor. Flat mood and affect    Data Reviewed: I have personally reviewed following labs and imaging studies  CBC: Recent Labs  Lab 10/03/21 2013 10/07/21 1233 10/08/21 0450 10/09/21 0414  WBC 7.5 6.3 5.9 7.7  NEUTROABS 4.7  --   --   --   HGB 14.7 14.9 13.1 13.3  HCT 45.7 45.7 40.6 41.7  MCV 93.1 92.3 93.3 94.8  PLT 270 237 223 628   Basic Metabolic Panel: Recent Labs  Lab 10/03/21 2013 10/07/21 1233  NA 143 141  K 3.8 3.4*  CL 102 105  CO2 32 27  GLUCOSE 113* 70  BUN 20 33*  CREATININE 0.95 0.90  CALCIUM 10.7* 9.8   GFR: Estimated Creatinine Clearance: 32.8 mL/min (by C-G formula based on SCr of 0.9 mg/dL). Liver Function Tests: Recent Labs  Lab 10/03/21 2013 10/07/21 1233  AST 30 25  ALT 17 15  ALKPHOS 77 62  BILITOT 1.0 0.5  PROT 8.1 7.1  ALBUMIN 4.5 3.8   No results for  input(s): "LIPASE", "AMYLASE" in the last 168 hours. Recent Labs  Lab 10/07/21 1233  AMMONIA 12   Coagulation Profile: Recent Labs  Lab 10/05/21 0649 10/06/21 0612 10/07/21 0548 10/08/21 0450 10/09/21 0414  INR 1.9* 2.4* 2.8* 2.7* 3.0*   Cardiac Enzymes: No results for input(s): "CKTOTAL", "CKMB", "CKMBINDEX", "TROPONINI" in the last 168 hours. BNP (last 3 results) No results for input(s): "PROBNP" in the last 8760 hours. HbA1C: No results for input(s): "HGBA1C" in the last 72 hours. CBG: Recent Labs  Lab 10/08/21 1712  GLUCAP 80   Lipid Profile: No results for input(s): "CHOL", "HDL", "LDLCALC", "TRIG", "CHOLHDL", "LDLDIRECT" in the last 72 hours. Thyroid Function Tests: Recent Labs     10/07/21 1233  TSH 1.720  T4TOTAL 7.5   Anemia Panel: No results for input(s): "VITAMINB12", "FOLATE", "FERRITIN", "TIBC", "IRON", "RETICCTPCT" in the last 72 hours. Sepsis Labs: Recent Labs  Lab 10/07/21 1233  PROCALCITON <0.10    Recent Results (from the past 240 hour(s))  Urine Culture     Status: Abnormal   Collection Time: 10/03/21 11:35 PM   Specimen: Urine, Random  Result Value Ref Range Status   Specimen Description   Final    URINE, RANDOM Performed at [facility], [address] [place], [place] [address]    Special Requests   Final    NONE Performed at [facility], [address] [place], [place] [address]    Culture >=100,000 COLONIES/mL ESCHERICHIA COLI (A)  Final   Report Status 10/06/2021 FINAL  Final   Organism ID, Bacteria ESCHERICHIA COLI (A)  Final      Susceptibility   Escherichia coli - MIC*    AMPICILLIN 8 SENSITIVE Sensitive     CEFAZOLIN <=4 SENSITIVE Sensitive     CEFEPIME <=0.12 SENSITIVE Sensitive     CEFTRIAXONE <=0.25 SENSITIVE Sensitive     CIPROFLOXACIN <=0.25 SENSITIVE Sensitive     GENTAMICIN <=1 SENSITIVE Sensitive     IMIPENEM <=0.25 SENSITIVE Sensitive     NITROFURANTOIN <=16 SENSITIVE Sensitive     TRIMETH/SULFA <=20 SENSITIVE Sensitive     AMPICILLIN/SULBACTAM 4 SENSITIVE Sensitive     PIP/TAZO <=4 SENSITIVE Sensitive     * >=100,000 COLONIES/mL ESCHERICHIA COLI         Radiology Studies: No results found.      Scheduled Meds:  amLODipine  2.5 mg Oral Daily   atorvastatin  10 mg Oral Daily   memantine  10 mg Oral BID   metoprolol tartrate  25 mg Oral BID   mirtazapine  7.5 mg Oral QHS   Warfarin - Pharmacist Dosing Inpatient   Does not apply q1600   Continuous Infusions:   LOS: 5 days    Time spent: 25 mins     Wyvonnia Dusky, MD [facility] Pager [phone]  If 7PM-7AM, please contact night-coverage [email or web address] 10/09/2021, 2:16 PM

## 2021-10-10 LAB — BASIC METABOLIC PANEL
Anion gap: 4 — ABNORMAL LOW (ref 5–15)
BUN: 26 mg/dL — ABNORMAL HIGH (ref 8–23)
CO2: 25 mmol/L (ref 22–32)
Calcium: 9.2 mg/dL (ref 8.9–10.3)
Chloride: 114 mmol/L — ABNORMAL HIGH (ref 98–111)
Creatinine, Ser: 0.84 mg/dL (ref 0.44–1.00)
GFR, Estimated: >60 mL/min (ref >60)
Glucose, Bld: 128 mg/dL — ABNORMAL HIGH (ref 70–99)
Potassium: 4 mmol/L (ref 3.5–5.1)
Sodium: 143 mmol/L (ref 135–145)

## 2021-10-10 LAB — CBC
HCT: 38.2 % (ref 36.0–46.0)
Hemoglobin: 12.1 g/dL (ref 12.0–15.0)
MCH: 29.9 pg (ref 26.0–34.0)
MCHC: 31.7 g/dL (ref 30.0–36.0)
MCV: 94.3 fL (ref 80.0–100.0)
Platelets: 233 10*3/uL (ref 150–400)
RBC: 4.05 MIL/uL (ref 3.87–5.11)
RDW: 13.5 % (ref 11.5–15.5)
WBC: 12.5 10*3/uL — ABNORMAL HIGH (ref 4.0–10.5)
nRBC: 0 % (ref 0.0–0.2)

## 2021-10-10 LAB — PROTIME-INR
INR: 3.1 — ABNORMAL HIGH (ref 0.8–1.2)
Prothrombin Time: 31.6 seconds — ABNORMAL HIGH (ref 11.4–15.2)

## 2021-10-10 MED ORDER — QUETIAPINE FUMARATE 25 MG PO TABS
25.0000 mg | ORAL_TABLET | Freq: Two times a day (BID) | ORAL | Status: DC | PRN
  Filled 2021-10-10: qty 1

## 2021-10-10 MED ORDER — TRIAMCINOLONE ACETONIDE 40 MG/ML IJ SUSP
40.0000 mg | Freq: Once | INTRAMUSCULAR | Status: DC
  Filled 2021-10-10: qty 1

## 2021-10-10 MED ORDER — BUPIVACAINE HCL (PF) 0.5 % IJ SOLN
10.0000 mL | Freq: Once | INTRAMUSCULAR | Status: DC
  Filled 2021-10-10: qty 10

## 2021-10-10 MED ORDER — TRAMADOL HCL 50 MG PO TABS
50.0000 mg | ORAL_TABLET | Freq: Four times a day (QID) | ORAL | Status: DC | PRN
  Administered 2021-10-10 – 2021-10-11 (×2): 50 mg via ORAL
  Filled 2021-10-10 (×2): qty 1

## 2021-10-10 MED ORDER — HALOPERIDOL LACTATE 5 MG/ML IJ SOLN
5.0000 mg | Freq: Four times a day (QID) | INTRAMUSCULAR | Status: DC | PRN
  Administered 2021-10-10: 5 mg via INTRAMUSCULAR
  Filled 2021-10-10: qty 1

## 2021-10-10 NOTE — Progress Notes (Signed)
[place] for Warfarin  Indication: atrial fibrillation   Allergies  Allergen Reactions   Flagyl [Metronidazole] Nausea Only   Pacerone [Amiodarone]     Eye deposits   Ramipril Cough    Patient Measurements: Height: 5' (152.4 cm) Weight: 49.9 kg (110 lb) IBW/kg (Calculated) : 45.5  Vital Signs: Temp: 97.6 F (36.4 C) (10/05 0500) BP: 123/63 (10/05 0500) Pulse Rate: 58 (10/05 0500)  Labs: Recent Labs    10/07/21 1233 10/08/21 0450 10/09/21 0414 10/10/21 0449  HGB 14.9 13.1 13.3 12.1  HCT 45.7 40.6 41.7 38.2  PLT 237 223 203 233  LABPROT  --  28.5* 30.5* 31.6*  INR  --  2.7* 3.0* 3.1*  CREATININE 0.90  --   --  0.84     Estimated Creatinine Clearance: 35.2 mL/min (by C-G formula based on SCr of 0.84 mg/dL).   Medical History: Past Medical History:  Diagnosis Date   Asthma    Breast cancer ([place]) 2010   RIGHT   Cancer ([place])    basel cell, s/p MOH'S/DR. 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  home warfarin dose: 2 MG tablet every Sunday/Wednesday and 1 mg all other days  Goal of Therapy:  INR 2-3  Date INR Dose 9/29 1.7 '2mg'$  9/30 1.9 '2mg'$  10/1 2.4 '1mg'$  10/2 2.8 -- 10/3 2.7 2 mg 10/4 3.0 -- 10/5 3.1 --  Drug interactions: ceftriaxone > cephalexin   Plan: INR above goal today Will hold dose again today. Then, patient may return to home schedule.  Follow up PT-INR and CBC tomorrow AM   Glean Salvo, PharmD Clinical Pharmacist  10/10/2021 7:38 AM

## 2021-10-10 NOTE — Plan of Care (Signed)

## 2021-10-10 NOTE — Care Management Important Message (Signed)
Important Message  Patient Details  Name: Laura David MRN: [id number] Date of Birth: 05/16/1934   Medicare Important Message Given:  Yes  Reviewed with the son, Lennette Bihari and patient in the room.    Juliann Pulse A [name] 10/10/2021, 11:28 AM

## 2021-10-10 NOTE — Progress Notes (Signed)
pt with acute onset agitation, writing on window with toothpaste. With attempt to redirect pt, she grab the NT yelling. MD notified Seroquel PO ordered, attempted to administer. Pt threw pill across the floor, grabbing RN wrist. Pt is agitated and combative. Notified MD. Seroquel d/c'd. IM haldol ordered. Haldol administered. Family have returned to bedside. Continuing to monitor.

## 2021-10-10 NOTE — Progress Notes (Signed)
PROGRESS NOTE    Laura David  DJM:[id number] DOB: 06-29-1934 DOA: 10/03/2021 PCP: [name], MD   Assessment & Plan:   Principal Problem:   Senile dementia with delirium with behavioral disturbance ([place]) Active Problems:   E. coli UTI   PAF (paroxysmal atrial fibrillation) ([place])   Hypertension   Fall at home, initial encounter   Anticoagulated on Coumadin   Right shoulder pain  Assessment and Plan: Dementia: with delirium with behavioral disturbance. Continue on home dose of namenda, remeron   E. coli UTI: completed abx course   PAF: continue on metoprolol, coumadin. Pharmacy to dose & monitor coumadin/INR.    Right shoulder pain: MRI notes rotator cuff tears and significant glenohumeral arthritis. S/p right steroid shoulder joint injection as per ortho surg. Will have another steroid joint injection today as per ortho surg. D/c percocet secondary to multiple ADRs and start tramadol   Fall at home: PT/OT recs HH. Hx of falls w/ injury    HTN: continue on amlodipine, metoprolol       DVT prophylaxis: coumadin  Code Status: DNR Family Communication: discussed pt's care w/ pt's son, Lennette Bihari, and answered his questions  Disposition Plan: d/c back home w/ home health   Level of care: Med-Surg  Status is: Inpatient Remains inpatient appropriate because: will get another joint steroid injection today    Consultants:    Procedures:   Antimicrobials:   Subjective: Pt c/o shoulder pain   Objective: Vitals:   10/09/21 1645 10/09/21 1932 10/10/21 0500 10/10/21 0747  BP:  125/68 123/63 138/74  Pulse:  86 (!) 58 62  Resp:  '16 16 17  '$ Temp:  98.2 F (36.8 C) 97.6 F (36.4 C) 97.9 F (36.6 C)  TempSrc:      SpO2: 99% 100% 97% 100%  Weight:      Height:        Intake/Output Summary (Last 24 hours) at 10/10/2021 0754 Last data filed at 10/09/2021 1300 Gross per 24 hour  Intake 600 ml  Output --  Net 600 ml   Filed Weights   10/03/21 2009  Weight:  49.9 kg    Examination:  General exam: Appears comfortable  Respiratory system: clear breath sounds b/l  Cardiovascular system: S1/S2+. No rubs or gallops Gastrointestinal system: Abd is soft, NT, ND & hypoactive bowel sounds Central nervous system: Alert and awake. Moves all extremities Psychiatry:  judgement and insight appears poor. Flat mood and affect    Data Reviewed: I have personally reviewed following labs and imaging studies  CBC: Recent Labs  Lab 10/03/21 2013 10/07/21 1233 10/08/21 0450 10/09/21 0414 10/10/21 0449  WBC 7.5 6.3 5.9 7.7 12.5*  NEUTROABS 4.7  --   --   --   --   HGB 14.7 14.9 13.1 13.3 12.1  HCT 45.7 45.7 40.6 41.7 38.2  MCV 93.1 92.3 93.3 94.8 94.3  PLT 270 237 223 203 222   Basic Metabolic Panel: Recent Labs  Lab 10/03/21 2013 10/07/21 1233 10/10/21 0449  NA 143 141 143  K 3.8 3.4* 4.0  CL 102 105 114*  CO2 32 27 25  GLUCOSE 113* 70 128*  BUN 20 33* 26*  CREATININE 0.95 0.90 0.84  CALCIUM 10.7* 9.8 9.2   GFR: Estimated Creatinine Clearance: 35.2 mL/min (by C-G formula based on SCr of 0.84 mg/dL). Liver Function Tests: Recent Labs  Lab 10/03/21 2013 10/07/21 1233  AST 30 25  ALT 17 15  ALKPHOS 77 62  BILITOT 1.0 0.5  PROT 8.1 7.1  ALBUMIN 4.5 3.8   No results for input(s): "LIPASE", "AMYLASE" in the last 168 hours. Recent Labs  Lab 10/07/21 1233  AMMONIA 12   Coagulation Profile: Recent Labs  Lab 10/06/21 0612 10/07/21 0548 10/08/21 0450 10/09/21 0414 10/10/21 0449  INR 2.4* 2.8* 2.7* 3.0* 3.1*   Cardiac Enzymes: No results for input(s): "CKTOTAL", "CKMB", "CKMBINDEX", "TROPONINI" in the last 168 hours. BNP (last 3 results) No results for input(s): "PROBNP" in the last 8760 hours. HbA1C: No results for input(s): "HGBA1C" in the last 72 hours. CBG: Recent Labs  Lab 10/08/21 1712  GLUCAP 80   Lipid Profile: No results for input(s): "CHOL", "HDL", "LDLCALC", "TRIG", "CHOLHDL", "LDLDIRECT" in the last 72  hours. Thyroid Function Tests: Recent Labs    10/07/21 1233  TSH 1.720  T4TOTAL 7.5   Anemia Panel: No results for input(s): "VITAMINB12", "FOLATE", "FERRITIN", "TIBC", "IRON", "RETICCTPCT" in the last 72 hours. Sepsis Labs: Recent Labs  Lab 10/07/21 1233  PROCALCITON <0.10    Recent Results (from the past 240 hour(s))  Urine Culture     Status: Abnormal   Collection Time: 10/03/21 11:35 PM   Specimen: Urine, Random  Result Value Ref Range Status   Specimen Description   Final    URINE, RANDOM Performed at [facility], [address] [place], [place] [address]    Special Requests   Final    NONE Performed at [facility], [address] [place], [place] [address]    Culture >=100,000 COLONIES/mL ESCHERICHIA COLI (A)  Final   Report Status 10/06/2021 FINAL  Final   Organism ID, Bacteria ESCHERICHIA COLI (A)  Final      Susceptibility   Escherichia coli - MIC*    AMPICILLIN 8 SENSITIVE Sensitive     CEFAZOLIN <=4 SENSITIVE Sensitive     CEFEPIME <=0.12 SENSITIVE Sensitive     CEFTRIAXONE <=0.25 SENSITIVE Sensitive     CIPROFLOXACIN <=0.25 SENSITIVE Sensitive     GENTAMICIN <=1 SENSITIVE Sensitive     IMIPENEM <=0.25 SENSITIVE Sensitive     NITROFURANTOIN <=16 SENSITIVE Sensitive     TRIMETH/SULFA <=20 SENSITIVE Sensitive     AMPICILLIN/SULBACTAM 4 SENSITIVE Sensitive     PIP/TAZO <=4 SENSITIVE Sensitive     * >=100,000 COLONIES/mL ESCHERICHIA COLI         Radiology Studies: No results found.      Scheduled Meds:  amLODipine  2.5 mg Oral Daily   atorvastatin  10 mg Oral Daily   memantine  10 mg Oral BID   metoprolol tartrate  25 mg Oral BID   mirtazapine  7.5 mg Oral QHS   Warfarin - Pharmacist Dosing Inpatient   Does not apply q1600   Continuous Infusions:   LOS: 6 days    Time spent: 25 mins     Wyvonnia Dusky, MD [facility] Pager [phone]  If 7PM-7AM, please contact  night-coverage [email or web address] 10/10/2021, 7:54 AM

## 2021-10-10 NOTE — Progress Notes (Signed)
Occupational Therapy Treatment Patient Details Name: Laura David MRN: [id number] DOB: 28-Nov-1934 Today's Date: 10/10/2021   History of present illness Laura David is a 87 y.o. female with medical history significant for Paroxysmal atrial fibrillation on warfarin, breast cancer, hypertension, mild cognitive impairment, who presents to the ED with concerns for confusion beyond her baseline. As well as a fall earlier in the day with pain to her right shoulder and coccyx   OT comments  Pt seen for OT tx. Pt endorses continued R shoulder pain but visually appears in less pain during session than previous session on Tuesday with this therapist. Pt required CGA for bed mobility, STS transfers, and toilet transfer (std height) with VC to back up prior to descent. CGA in standing with noted increased pain in R shoulder with pericare attempts. VC to trial using L hand which pt was able to return demo. PRN VC for RW mgt and initiation/sequencing of tasks this morning. Set up assist for breakfast items. Pt progressing towards goals, continues to benefit from skilled OT services to maximize return to PLOF and minimize caregiver burden and functional decline.    Recommendations for follow up therapy are one component of a multi-disciplinary discharge planning process, led by the attending physician.  Recommendations may be updated based on patient status, additional functional criteria and insurance authorization.    Follow Up Recommendations  Home health OT    Assistance Recommended at Discharge Intermittent Supervision/Assistance  Patient can return home with the following  Assistance with cooking/housework;Direct supervision/assist for medications management;Help with stairs or ramp for entrance;A little help with walking and/or transfers;A little help with bathing/dressing/bathroom   Equipment Recommendations  BSC/3in1    Recommendations for Other Services      Precautions / Restrictions  Precautions Precautions: Fall Restrictions Weight Bearing Restrictions: No       Mobility Bed Mobility Overal bed mobility: Needs Assistance Bed Mobility: Supine to Sit     Supine to sit: Min guard     General bed mobility comments: increased effort to complete to R side    Transfers Overall transfer level: Needs assistance Equipment used: Rolling walker (2 wheels) Transfers: Sit to/from Stand Sit to Stand: Min guard                 Balance Overall balance assessment: Needs assistance Sitting-balance support: Feet supported Sitting balance-Leahy Scale: Good     Standing balance support: Single extremity supported, During functional activity, Reliant on assistive device for balance Standing balance-Leahy Scale: Fair                             ADL either performed or assessed with clinical judgement   ADL                                         General ADL Comments: Pt required CGA for toilet transfer (std height) and VC to back up prior to descent. CGA in standing with noted increased pain in R shoulder with pericare attempts. VC to trial using L hand which pt was able to return demo. PRN VC for RW mgt and initiation/sequencing of tasks this morning. Set up assist for breakfast items.    Extremity/Trunk Assessment              Vision       Perception  Praxis      Cognition Arousal/Alertness: Awake/alert Behavior During Therapy: WFL for tasks assessed/performed Overall Cognitive Status: History of cognitive impairments - at baseline                                 General Comments: requires VC for sequencing, safety, initiation for tasks at times, follows commands well        Exercises      Shoulder Instructions       General Comments      Pertinent Vitals/ Pain       Pain Assessment Pain Assessment: Faces Faces Pain Scale: Hurts little more Pain Location: R shoulder with movement;  reports "medium" amount of pain Pain Descriptors / Indicators: Grimacing, Guarding Pain Intervention(s): Limited activity within patient's tolerance, Monitored during session, Repositioned  Home Living                                          Prior Functioning/Environment              Frequency  Min 2X/week        Progress Toward Goals  OT Goals(current goals can now be found in the care plan section)  Progress towards OT goals: Progressing toward goals  Acute Rehab OT Goals Patient Stated Goal: to improve pain OT Goal Formulation: With patient/family Time For Goal Achievement: 10/19/21 Potential to Achieve Goals: Good  Plan Discharge plan remains appropriate;Frequency remains appropriate    Co-evaluation                 AM-PAC OT "6 Clicks" Daily Activity     Outcome Measure   Help from another person eating meals?: None Help from another person taking care of personal grooming?: A Little Help from another person toileting, which includes using toliet, bedpan, or urinal?: A Little Help from another person bathing (including washing, rinsing, drying)?: A Little Help from another person to put on and taking off regular upper body clothing?: A Little Help from another person to put on and taking off regular lower body clothing?: A Little 6 Click Score: 19    End of Session Equipment Utilized During Treatment: Gait belt;Rolling walker (2 wheels)  OT Visit Diagnosis: History of falling (Z91.81);Other abnormalities of gait and mobility (R26.89)   Activity Tolerance Patient tolerated treatment well   Patient Left in chair;with call bell/phone within reach;with chair alarm set   Nurse Communication Mobility status        Time: 5329-9242 OT Time Calculation (min): 24 min  Charges: OT General Charges $OT Visit: 1 Visit OT Treatments $Self Care/Home Management : 23-37 mins  Ardeth Perfect., MPH, MS, OTR/L ascom [phone] 10/10/21, 10:42  AM

## 2021-10-10 NOTE — Progress Notes (Signed)
Patient ID: Laura David, female   DOB: Dec 29, 1934, 87 y.o.   MRN: [id number]  Subjective: The patient complains of continued right shoulder pain which she states might be slightly improved as compared to yesterday but is still quite painful.  She still has difficulty even reaching her hand to her face.  She denies any reinjury to the shoulder, and denies any other complaints pertaining to her shoulder.   Objective: Vital signs in last 24 hours: Temp:  [97.3 F (36.3 C)-98.2 F (36.8 C)] 97.3 F (36.3 C) (10/05 1540) Pulse Rate:  [58-86] 81 (10/05 1540) Resp:  [16-17] 17 (10/05 1540) BP: (122-138)/(62-74) 122/62 (10/05 1540) SpO2:  [97 %-100 %] 100 % (10/05 1540)  Intake/Output from previous day: 10/04 0701 - 10/05 0700 In: 600 [P.O.:600] Out: -  Intake/Output this shift: Total I/O In: 480 [P.O.:480] Out: -   Recent Labs    10/08/21 0450 10/09/21 0414 10/10/21 0449  HGB 13.1 13.3 12.1   Recent Labs    10/09/21 0414 10/10/21 0449  WBC 7.7 12.5*  RBC 4.40 4.05  HCT 41.7 38.2  PLT 203 233   Recent Labs    10/10/21 0449  NA 143  K 4.0  CL 114*  CO2 25  BUN 26*  CREATININE 0.84  GLUCOSE 128*  CALCIUM 9.2   Recent Labs    10/09/21 0414 10/10/21 0449  INR 3.0* 3.1*    Physical Exam: Orthopedic examination again is limited to the right shoulder and upper extremity.  The patient is able to barely reach her ipsilateral ear with the right hand due to her shoulder pain.  However, this is a slight improvement as compared to 2 days ago.  Skin inspection around the shoulder again is notable only for mild swelling, but otherwise is unremarkable.  No erythema, ecchymosis, abrasions, or other skin abnormalities are identified.  She exhibits mild-moderate tenderness diffusely to palpation around the shoulder.  Again, she has significant pain with any active or passive motion of the shoulder.  She is neurovascular intact to the right upper extremity and  hand.  Assessment: Right shoulder pain secondary to advanced degenerative joint disease with rotator cuff tears and possible hemarthrosis.  Plan: The treatment options were discussed with the patient and her son, who was at the bedside when I spoke with the patient this morning.  After discussion of the treatment options, she elected to proceed with an intra-articular injection into the right glenohumeral joint so the medications were ordered from the pharmacy and I told her that I would come back later to perform the injection.  When I returned to the room, the patient was quite agitated and confused.  The nursing staff was trying to talk her down and the son had been called to come back in.  Therefore, I have elected to hold off on doing the injection at this time.  I do see that the patient's white count has jumped to 12.5, so an infection work-up might be indicated given her new onset of confusion although she remains afebrile at this time.   [place] Cork Minh [place] 10/10/2021, 5:04 PM

## 2021-10-11 LAB — BASIC METABOLIC PANEL
Anion gap: 5 (ref 5–15)
BUN: 22 mg/dL (ref 8–23)
CO2: 27 mmol/L (ref 22–32)
Calcium: 9.6 mg/dL (ref 8.9–10.3)
Chloride: 111 mmol/L (ref 98–111)
Creatinine, Ser: 0.64 mg/dL (ref 0.44–1.00)
GFR, Estimated: >60 mL/min (ref >60)
Glucose, Bld: 110 mg/dL — ABNORMAL HIGH (ref 70–99)
Potassium: 4 mmol/L (ref 3.5–5.1)
Sodium: 143 mmol/L (ref 135–145)

## 2021-10-11 LAB — CBC
HCT: 39.8 % (ref 36.0–46.0)
Hemoglobin: 12.8 g/dL (ref 12.0–15.0)
MCH: 30.3 pg (ref 26.0–34.0)
MCHC: 32.2 g/dL (ref 30.0–36.0)
MCV: 94.1 fL (ref 80.0–100.0)
Platelets: 234 10*3/uL (ref 150–400)
RBC: 4.23 MIL/uL (ref 3.87–5.11)
RDW: 13.4 % (ref 11.5–15.5)
WBC: 10.1 10*3/uL (ref 4.0–10.5)
nRBC: 0 % (ref 0.0–0.2)

## 2021-10-11 LAB — PROTIME-INR
INR: 2.3 — ABNORMAL HIGH (ref 0.8–1.2)
Prothrombin Time: 25.2 seconds — ABNORMAL HIGH (ref 11.4–15.2)

## 2021-10-11 MED ORDER — WARFARIN SODIUM 2 MG PO TABS
2.0000 mg | ORAL_TABLET | Freq: Once | ORAL | Status: DC
  Filled 2021-10-11: qty 1

## 2021-10-11 MED ORDER — TRAMADOL HCL 50 MG PO TABS: 50.0000 mg | ORAL_TABLET | Freq: Four times a day (QID) | ORAL | 0 refills | Status: AC | PRN

## 2021-10-11 NOTE — Progress Notes (Signed)
Patient ID: Laura David, female   DOB: 06/03/1934, 87 y.o.   MRN: [id number]  Subjective: The patient appears to be less confused this afternoon as compared to yesterday afternoon.  The patient's son is in the room with her.  She continues to complain of moderate-severe right shoulder pain, although she admits that it is slightly improved as compared to the past few days.  She still has difficulty bringing her arm even to shoulder level.  She denies any new complaints pertaining to her shoulder.   Objective: Vital signs in last 24 hours: Temp:  [97.3 F (36.3 C)-98.2 F (36.8 C)] 98 F (36.7 C) (10/06 1158) Pulse Rate:  [60-86] 60 (10/06 1200) Resp:  [17-20] 19 (10/06 1200) BP: (122-153)/(57-80) 123/68 (10/06 1200) SpO2:  [96 %-100 %] 99 % (10/06 1158)  Intake/Output from previous day: 10/05 0701 - 10/06 0700 In: 720 [P.O.:720] Out: -  Intake/Output this shift: No intake/output data recorded.  Recent Labs    10/09/21 0414 10/10/21 0449 10/11/21 0609  HGB 13.3 12.1 12.8   Recent Labs    10/10/21 0449 10/11/21 0609  WBC 12.5* 10.1  RBC 4.05 4.23  HCT 38.2 39.8  PLT 233 234   Recent Labs    10/10/21 0449 10/11/21 0609  NA 143 143  K 4.0 4.0  CL 114* 111  CO2 25 27  BUN 26* 22  CREATININE 0.84 0.64  GLUCOSE 128* 110*  CALCIUM 9.2 9.6   Recent Labs    10/10/21 0449 10/11/21 0609  INR 3.1* 2.3*    Physical Exam: Examination again is limited to the right shoulder and upper extremity.  The swelling around the shoulder noted previously appears to be improved.  Again, no erythema, ecchymosis, abrasions, or other skin abnormalities are identified.  She has mild tenderness to palpation over the anterior lateral aspects of the shoulder.  Actively, she is able to reach her ipsilateral ear and nearly reach to her mouth, but still has trouble with raising her arm even to shoulder level due to pain.  She remains neurovascularly intact to the right upper extremity and  hand.  Assessment: Right shoulder pain secondary to advanced degenerative joint disease with rotator cuff tears and possible hemarthrosis.   Plan: The treatment options have been discussed with the patient and her son who is at the bedside.  They would like to proceed with the glenohumeral injection as discussed yesterday morning.  Therefore, this injection is performed via an anterior approach utilizing sterile technique with 9 cc of half percent Sensorcaine and 1 cc of Kenalog 40 (40 mg).  The patient tolerated the procedure well.  The patient may continue to use her arm as tolerated, and may continue to receive pain medications as deemed appropriate from medical standpoint.  Thank you for asked me to participate in the care of this most delightful yet unfortunate woman.  I will sign off at this time, but I will be happy to see her back in my office in 1 month for re-evaluation if necessary.   Marshall Cork Seville Brick 10/11/2021, 2:45 PM

## 2021-10-11 NOTE — Discharge Summary (Signed)
Physician Discharge Summary  Laura David HGD:[id number] DOB: 1934-11-14 DOA: 10/03/2021  PCP: [name], MD  Admit date: 10/03/2021 Discharge date: 10/11/2021  Admitted From: home  Disposition:  home w/ home   Recommendations for Outpatient Follow-up:  Follow up with PCP in 1-2 weeks F/u w/ ortho surg, Dr. Roland Rack, in 1-2 weeks   Home Health: yes Equipment/Devices:  Discharge Condition: stable  CODE STATUS: full  Diet recommendation: Heart Healthy   Brief/Interim Summary: HPI was taken from Dr. Damita Dunnings: Laura David is a 87 y.o. female with medical history significant for Paroxysmal atrial fibrillation on warfarin, breast cancer, hypertension, mild cognitive impairment, who presents to the ED with concerns for confusion beyond her baseline. As well as a fall earlier in the day with pain to her right shoulder and coccyx.   Her son who gives most of the history says that  over the past three weeks she has been sleeping a lot sometimes from 6pm to 10am and this causes her to miss meals. Her neurologist discontinued her Lexapro at the visit on 09/24/21 and started her on mirtazapine but this did not make a difference. She has had no other complaints of nausea, vomiting, abdominal pain, diarrhea or dysuria, chest pain, shortness of breath or palpitations, headache or weakness. ED course and data review: BP 159/81 with otherwise normal vitals.  Labs showing normal CBC, CMP mostly unremarkable, troponin of 9, INR 1.6, urinalysis with positive nitrites and small leukocyte esterase and many bacteria. Trauma imaging of head right shoulder and sacrum and coccyx all nonacute. Patient given an NS 1 L bolus started on Rocephin and hospitalist consulted for admission.   As per Dr. Maryland Pink: Laura David is a 87 y.o. female with medical history significant for Paroxysmal atrial fibrillation on warfarin, breast cancer, hypertension, and dementia who presented to the emergency room on 9/29 for  increased confusion which was found to be secondary to E. coli UTI.  Some improvement with confusion although patient continued to wax and wane.  Her home medicines were restarted on 10/2 which did help.  Patient found be complaining of significant right shoulder pain with MRI noting significant glenohumeral osteoarthritis and rotator cuff tears.  Orthopedic surgery consulted and after evaluation of patient, performed injection of right steroid joint.   As per Dr. Jimmye Norman 10/4-10/6/23: Pt did get 1 steroid injection by ortho surg which provided some relief but still was having pain. Pt was placed on low dose percocet but often became confused and sleepy after taking so pt was switch to po tramadol. PT/OT evaluate pt and recommended home health. Home health was set up by CM prior to d/c. Of note, pt often sundowned as pt has dementia and became agitated and to be given medication to help with the agitation. Pt did receive a second steroid joint injection b/c pt was agitated & refused. Pt may f/u outpatient w/ ortho surg, Dr. Roland Rack, in 1-2 weeks for another steroid joint injection if it is needed. For more information, please see previous progress/consult notes.   Discharge Diagnoses:  Principal Problem:   Senile dementia with delirium with behavioral disturbance ([place]) Active Problems:   E. coli UTI   PAF (paroxysmal atrial fibrillation) ([place])   Hypertension   Fall at home, initial encounter   Anticoagulated on Coumadin   Right shoulder pain  Dementia: with delirium with behavioral disturbance. Continue on home dose of namenda, remeron   E. coli UTI: completed abx course   PAF: continue on metoprolol,  coumadin. Pharmacy to dose & monitor coumadin/INR.    Right shoulder pain: MRI notes rotator cuff tears and significant glenohumeral arthritis. S/p right steroid shoulder joint injection as per ortho surg. Did not get a second steroid injection b/c pt was agitated & refused on 10/10/21. D/c  percocet secondary to multiple ADRs and started  tramadol    Fall at home: PT/OT recs HH. Hx of falls w/ injury    HTN: continue on amlodipine, metoprolol    Discharge Instructions  Discharge Instructions     Diet - low sodium heart healthy   Complete by: As directed    Discharge instructions   Complete by: As directed    F/u w/ PCP in 1-2 weeks. F/u w/ ortho surg, Dr. Roland Rack, in 1-2 weeks   Increase activity slowly   Complete by: As directed    Increase activity slowly   Complete by: As directed       Allergies as of 10/11/2021       Reactions   Flagyl [metronidazole] Nausea Only   Pacerone [amiodarone]    Eye deposits   Ramipril Cough        Medication List     TAKE these medications    alendronate 70 MG tablet Commonly known as: FOSAMAX Take 70 mg by mouth once a week. ('Sundays)   amLODipine 2.5 MG tablet Commonly known as: NORVASC Take 2.5 mg by mouth daily. What changed: Another medication with the same name was removed. Continue taking this medication, and follow the directions you see here.   atorvastatin 10 MG tablet Commonly known as: LIPITOR Take 10 mg by mouth at bedtime.   escitalopram 10 MG tablet Commonly known as: LEXAPRO Take 10 mg by mouth daily.   memantine 10 MG tablet Commonly known as: NAMENDA Take 10 mg by mouth in the morning and at bedtime.   metoprolol tartrate 25 MG tablet Commonly known as: LOPRESSOR Take 25 mg by mouth 2 (two) times daily.   mirtazapine 7.5 MG tablet Commonly known as: REMERON Take 7.5 mg by mouth at bedtime.   traMADol 50 MG tablet Commonly known as: ULTRAM Take 1 tablet (50 mg total) by mouth every 6 (six) hours as needed for up to 5 days for moderate pain or severe pain.   warfarin 2 MG tablet Commonly known as: COUMADIN Take 2 mg by mouth daily. TAKE AS DIRECTED.               Durable Medical Equipment  (From admission, onward)           Start     Ordered   10/05/21 1038  For  home use only DME 3 n 1  Once        09'$ /30/23 1037            Follow-up Information     [name], MD Follow up in 1 week(s).   Specialty: Family Medicine Why: OFFICE CLOSED AT THIS TIME PATIENT TO MAKE OWN FOLLOW UP APPT Contact information: [address] [place] [address] [phone]                Allergies  Allergen Reactions   Flagyl [Metronidazole] Nausea Only   Pacerone [Amiodarone]     Eye deposits   Ramipril Cough    Consultations: Ortho surg   Procedures/Studies: MR SHOULDER RIGHT WO CONTRAST  Result Date: 10/07/2021 CLINICAL DATA:  Shoulder pain.  Stress fracture suspected. EXAM: MRI OF THE RIGHT SHOULDER WITHOUT CONTRAST TECHNIQUE:  Multiplanar, multisequence MR imaging of the shoulder was performed. No intravenous contrast was administered. COMPARISON:  Right shoulder radiographs 10/03/2021 FINDINGS: Despite efforts by the technologist and patient, motion artifact is present on today's exam and could not be eliminated. This reduces exam sensitivity and specificity. Rotator cuff: There is a full-thickness tear of the anterior supraspinatus tendon footprint measuring up to 16 mm in AP dimension (sagittal series 8, image 8) and with up to 11 mm tendon retraction. There is mild-to-moderate subcortical cystic change focally within the humeral head deep to this tendon tear. Mild posterior supraspinatus and anterior infraspinatus intermediate T2 signal tendinosis. Punctate fluid bright superior subscapularis tendon partial-thickness tear. The teres minor is intact. Muscles: Severe supraspinatus and moderate infraspinatus muscle atrophy. Biceps long head: Mild proximal long head of the biceps intermediate T2 signal tendinosis. Acromioclavicular Joint: There are mild degenerative changes of the acromioclavicular joint including joint space narrowing, subchondral marrow edema, and peripheral osteophytosis. Type II acromion. Mild fluid within the  subacromial/subdeltoid bursa. Glenohumeral Joint: Mild glenohumeral joint effusion. Severe glenohumeral osteoarthritis including full-thickness cartilage loss diffusely, mild high-grade glenoid fossa and humeral head cortical flattening/remodeling, an anterior glenoid degenerative subchondral cyst measuring up to 2.0 cm in craniocaudal dimension and 1.3 x 1.1 cm in transverse dimension (sagittal image 17 and axial image 12), large posterior glenoid degenerative osteophytes (which may be chronically separated from the glenoid rim), and a large inferior humeral head-neck junction degenerative osteophytes. Mild anterior inferior humeral head subchondral marrow edema. Labrum: The anterior glenoid labrum is not visualized. High-grade degenerative tearing of the superior glenoid labrum. Bones:  No acute fracture. Other: None. IMPRESSION: 1. Full-thickness tear of the anterior supraspinatus tendon footprint measuring up to 16 mm in AP dimension. Severe supraspinatus and moderate infraspinatus muscle atrophy. 2. Punctate partial-thickness tear of the distal superior subscapularis tendon. 3. Mild proximal long head of the biceps tendinosis. 4. Mild degenerative changes of the acromioclavicular joint. 5. Severe glenohumeral osteoarthritis. 6. No acute fracture is seen. Electronically Signed   By: Yvonne Kendall M.D.   On: 10/07/2021 16:09   DG Sacrum/Coccyx  Result Date: 10/03/2021 CLINICAL DATA:  Coccyx pain after fall EXAM: SACRUM AND COCCYX - 2+ VIEW COMPARISON:  Radiograph 01/09/2020 FINDINGS: There is no evidence of fracture or other focal bone lesions. Demineralization. IMPRESSION: Negative. Electronically Signed   By: Placido Sou M.D.   On: 10/03/2021 20:56   DG Shoulder Right  Result Date: 10/03/2021 CLINICAL DATA:  Right shoulder pain after fall EXAM: RIGHT SHOULDER - 2+ VIEW COMPARISON:  Radiographs 01/09/2020 FINDINGS: No acute fracture or dislocation. Advanced hypertrophic degenerative arthritis in the  right shoulder. IMPRESSION: No acute fracture or dislocation. Advanced right shoulder arthritis. Electronically Signed   By: Placido Sou M.D.   On: 10/03/2021 20:54   CT Head Wo Contrast  Result Date: 10/03/2021 CLINICAL DATA:  Head trauma, intracranial arterial injury suspected fall. Fall EXAM: CT HEAD WITHOUT CONTRAST TECHNIQUE: Contiguous axial images were obtained from the base of the skull through the vertex without intravenous contrast. RADIATION DOSE REDUCTION: This exam was performed according to the departmental dose-optimization program which includes automated exposure control, adjustment of the mA and/or kV according to patient size and/or use of iterative reconstruction technique. COMPARISON:  08/13/2021 FINDINGS: Brain: There is atrophy and chronic small vessel disease changes. No acute intracranial abnormality. Specifically, no hemorrhage, hydrocephalus, mass lesion, acute infarction, or significant intracranial injury. Vascular: No hyperdense vessel or unexpected calcification. Skull: No acute calvarial abnormality. Sinuses/Orbits: No acute findings Other:  None IMPRESSION: Atrophy, chronic microvascular disease. No acute intracranial abnormality. Electronically Signed   By: Rolm Baptise M.D.   On: 10/03/2021 20:35   (Echo, Carotid, EGD, Colonoscopy, ERCP)    Subjective: Pt c/o some shoulder pain    Discharge Exam: Vitals:   10/11/21 1158 10/11/21 1200  BP:  123/68  Pulse: 62 60  Resp:  19  Temp: 98 F (36.7 C)   SpO2: 99%    Vitals:   10/11/21 0603 10/11/21 0956 10/11/21 1158 10/11/21 1200  BP: (!) 141/74 (!) 124/57  123/68  Pulse: 74 86 62 60  Resp: '20 20  19  '$ Temp: 98 F (36.7 C) (!) 97.4 F (36.3 C) 98 F (36.7 C)   TempSrc:  Oral    SpO2: 96% 98% 99%   Weight:      Height:        General: Pt is alert, awake, not in acute distress. Frail appearing  Cardiovascular: S1/S2 +, no rubs, no gallops Respiratory: CTA bilaterally, no wheezing, no  rhonchi Abdominal: Soft, NT, ND, bowel sounds + Extremities: no edema, no cyanosis    The results of significant diagnostics from this hospitalization (including imaging, microbiology, ancillary and laboratory) are listed below for reference.     Microbiology: Recent Results (from the past 240 hour(s))  Urine Culture     Status: Abnormal   Collection Time: 10/03/21 11:35 PM   Specimen: Urine, Random  Result Value Ref Range Status   Specimen Description   Final    URINE, RANDOM Performed at [facility], [address] [place], [place] [address]    Special Requests   Final    NONE Performed at [facility], [place], [place] [address]    Culture >=100,000 COLONIES/mL ESCHERICHIA COLI (A)  Final   Report Status 10/06/2021 FINAL  Final   Organism ID, Bacteria ESCHERICHIA COLI (A)  Final      Susceptibility   Escherichia coli - MIC*    AMPICILLIN 8 SENSITIVE Sensitive     CEFAZOLIN <=4 SENSITIVE Sensitive     CEFEPIME <=0.12 SENSITIVE Sensitive     CEFTRIAXONE <=0.25 SENSITIVE Sensitive     CIPROFLOXACIN <=0.25 SENSITIVE Sensitive     GENTAMICIN <=1 SENSITIVE Sensitive     IMIPENEM <=0.25 SENSITIVE Sensitive     NITROFURANTOIN <=16 SENSITIVE Sensitive     TRIMETH/SULFA <=20 SENSITIVE Sensitive     AMPICILLIN/SULBACTAM 4 SENSITIVE Sensitive     PIP/TAZO <=4 SENSITIVE Sensitive     * >=100,000 COLONIES/mL ESCHERICHIA COLI     Labs: BNP (last 3 results) Recent Labs    10/07/21 1233  BNP 11.9   Basic Metabolic Panel: Recent Labs  Lab 10/07/21 1233 10/10/21 0449 10/11/21 0609  NA 141 143 143  K 3.4* 4.0 4.0  CL 105 114* 111  CO2 '27 25 27  '$ GLUCOSE 70 128* 110*  BUN 33* 26* 22  CREATININE 0.90 0.84 0.64  CALCIUM 9.8 9.2 9.6   Liver Function Tests: Recent Labs  Lab 10/07/21 1233  AST 25  ALT 15  ALKPHOS 62  BILITOT 0.5  PROT 7.1  ALBUMIN 3.8   No results for input(s): "LIPASE", "AMYLASE" in the last 168 hours. Recent  Labs  Lab 10/07/21 1233  AMMONIA 12   CBC: Recent Labs  Lab 10/07/21 1233 10/08/21 0450 10/09/21 0414 10/10/21 0449 10/11/21 0609  WBC 6.3 5.9 7.7 12.5* 10.1  HGB 14.9 13.1 13.3 12.1 12.8  HCT 45.7 40.6 41.7 38.2 39.8  MCV 92.3 93.3 94.8 94.3 94.1  PLT 237 223 203 233 234   Cardiac Enzymes: No results for input(s): "CKTOTAL", "CKMB", "CKMBINDEX", "TROPONINI" in the last 168 hours. BNP: Invalid input(s): "POCBNP" CBG: Recent Labs  Lab 10/08/21 1712  GLUCAP 80   D-Dimer No results for input(s): "DDIMER" in the last 72 hours. Hgb A1c No results for input(s): "HGBA1C" in the last 72 hours. Lipid Profile No results for input(s): "CHOL", "HDL", "LDLCALC", "TRIG", "CHOLHDL", "LDLDIRECT" in the last 72 hours. Thyroid function studies No results for input(s): "TSH", "T4TOTAL", "T3FREE", "THYROIDAB" in the last 72 hours.  Invalid input(s): "FREET3" Anemia work up No results for input(s): "VITAMINB12", "FOLATE", "FERRITIN", "TIBC", "IRON", "RETICCTPCT" in the last 72 hours. Urinalysis    Component Value Date/Time   COLORURINE YELLOW (A) 10/03/2021 2335   APPEARANCEUR HAZY (A) 10/03/2021 2335   LABSPEC 1.018 10/03/2021 2335   PHURINE 5.0 10/03/2021 2335   GLUCOSEU NEGATIVE 10/03/2021 2335   HGBUR MODERATE (A) 10/03/2021 2335   BILIRUBINUR NEGATIVE 10/03/2021 2335   KETONESUR NEGATIVE 10/03/2021 2335   PROTEINUR NEGATIVE 10/03/2021 2335   NITRITE POSITIVE (A) 10/03/2021 2335   LEUKOCYTESUR SMALL (A) 10/03/2021 2335   Sepsis Labs Recent Labs  Lab 10/08/21 0450 10/09/21 0414 10/10/21 0449 10/11/21 0609  WBC 5.9 7.7 12.5* 10.1   Microbiology Recent Results (from the past 240 hour(s))  Urine Culture     Status: Abnormal   Collection Time: 10/03/21 11:35 PM   Specimen: Urine, Random  Result Value Ref Range Status   Specimen Description   Final    URINE, RANDOM Performed at [facility], [address] [place], [place] [address]    Special Requests    Final    NONE Performed at [facility], [address] [place], [place] [address]    Culture >=100,000 COLONIES/mL ESCHERICHIA COLI (A)  Final   Report Status 10/06/2021 FINAL  Final   Organism ID, Bacteria ESCHERICHIA COLI (A)  Final      Susceptibility   Escherichia coli - MIC*    AMPICILLIN 8 SENSITIVE Sensitive     CEFAZOLIN <=4 SENSITIVE Sensitive     CEFEPIME <=0.12 SENSITIVE Sensitive     CEFTRIAXONE <=0.25 SENSITIVE Sensitive     CIPROFLOXACIN <=0.25 SENSITIVE Sensitive     GENTAMICIN <=1 SENSITIVE Sensitive     IMIPENEM <=0.25 SENSITIVE Sensitive     NITROFURANTOIN <=16 SENSITIVE Sensitive     TRIMETH/SULFA <=20 SENSITIVE Sensitive     AMPICILLIN/SULBACTAM 4 SENSITIVE Sensitive     PIP/TAZO <=4 SENSITIVE Sensitive     * >=100,000 COLONIES/mL ESCHERICHIA COLI     Time coordinating discharge: Over 30 minutes  SIGNED:   Wyvonnia Dusky, MD  [facility] 10/11/2021, 12:47 PM Pager   If 7PM-7AM, please contact night-coverage [email or web address]

## 2021-10-11 NOTE — TOC Progression Note (Signed)
Transition of Care [facility]) - Progression Note    Patient Details  Name: ALYZE LAUF MRN: [id number] Date of Birth: 1934-09-12  Transition of Care [facility]) CM/SW [place], LCSW Phone Number: 10/11/2021, 1:47 PM  Clinical Narrative:    CSW received call from patient's son about patient discharging. He feels she needs daily care at home, CSW explained this is not covered by insurance and options of private pay aide or ALF [facility] referral). Explained [place] services that have been arranged already but are not daily.  He states he feels patient needs some short term rehab at a SNF. Explained this is based on PT recommendation and so far has not been recommended. He asked if PT can reassess.  He states he does not feel he can care for patient at home in the state she is in and also would have to quit his job. Explained his right to appeal which he states was explained to him yesterday by [facility] Assistant. He is not sure he wants to do that. Updated MD and today's assigned RNCM to follow up.    Expected Discharge Plan: [place] Barriers to Discharge: Barriers Resolved  Expected Discharge Plan and Services Expected Discharge Plan: [place]       Living arrangements for the past 2 months: Single Family Home Expected Discharge Date: 10/11/21                         HH Arranged: PT, OT HH Agency: [place] Date [place]: 10/11/21   Representative spoke with at [place]: [place] ([place]) Interventions    Readmission Risk Interventions    01/13/2020    4:33 PM  Readmission Risk Prevention Plan  Transportation Screening Complete  Medication Review (RN CM) Complete

## 2021-10-11 NOTE — Progress Notes (Signed)
Occupational Therapy Treatment Patient Details Name: Laura David MRN: [id number] DOB: 29-Feb-1934 Today's Date: 10/11/2021   History of present illness Laura David is a 87 y.o. female with medical history significant for Paroxysmal atrial fibrillation on warfarin, breast cancer, hypertension, mild cognitive impairment, who presents to the ED with concerns for confusion beyond her baseline. As well as a fall earlier in the day with pain to her right shoulder and coccyx   OT comments  Pt seen for OT tx. Pt in bed, son present, and pt endorsing more pain in R shoulder today. Pt required CGA for toilet transfer to [facility] with VC for RW mgt and hand placement (pt fatigue and R shoulder pain limiting toileting to [facility] instead of to bathroom like previous session). Setup and supv for sitting pericare. CGA for limited mobility from [facility] to recliner with VC for RW mgt. Pillow placed under RUE while in the recliner for improved positioning and comfort. Pt continues to benefit from skilled OT services. Continue to recommend [place].    Recommendations for follow up therapy are one component of a multi-disciplinary discharge planning process, led by the attending physician.  Recommendations may be updated based on patient status, additional functional criteria and insurance authorization.    Follow Up Recommendations  Home health OT    Assistance Recommended at Discharge Intermittent Supervision/Assistance  Patient can return home with the following  Assistance with cooking/housework;Direct supervision/assist for medications management;Help with stairs or ramp for entrance;A little help with walking and/or transfers;A little help with bathing/dressing/bathroom   Equipment Recommendations  BSC/3in1    Recommendations for Other Services      Precautions / Restrictions Precautions Precautions: Fall Restrictions Weight Bearing Restrictions: No       Mobility Bed Mobility Overal bed mobility: Needs  Assistance Bed Mobility: Supine to Sit     Supine to sit: Min guard     General bed mobility comments: increased effort to complete to R side 2/2 R shoulder pain    Transfers Overall transfer level: Needs assistance Equipment used: Rolling walker (2 wheels) Transfers: Sit to/from Stand Sit to Stand: Min guard                 Balance Overall balance assessment: Needs assistance Sitting-balance support: Feet supported Sitting balance-Leahy Scale: Good     Standing balance support: During functional activity, Reliant on assistive device for balance, Bilateral upper extremity supported Standing balance-Leahy Scale: Fair                             ADL either performed or assessed with clinical judgement   ADL Overall ADL's : Needs assistance/impaired                                       General ADL Comments: Pt required CGA for toilet transfer to [facility] with VC for RW mgt and hand placement (pt fatigue and R shoulder pain limiting toileting to [facility] instead of to bathroom like previous session). Setup and supv for sitting pericare. CGA for limited mobility from [facility] to recliner with VC for RW mgt.    Extremity/Trunk Assessment              Vision       Perception     Praxis      Cognition Arousal/Alertness: Awake/alert Behavior During Therapy: WFL for  tasks assessed/performed Overall Cognitive Status: History of cognitive impairments - at baseline                                          Exercises      Shoulder Instructions       General Comments      Pertinent Vitals/ Pain       Pain Assessment Pain Assessment: Faces Faces Pain Scale: Hurts whole lot Pain Location: R shoulder with movement Pain Descriptors / Indicators: Grimacing, Guarding, Crying Pain Intervention(s): Limited activity within patient's tolerance, Monitored during session, Repositioned  Home Living                                           Prior Functioning/Environment              Frequency  Min 2X/week        Progress Toward Goals  OT Goals(current goals can now be found in the care plan section)  Progress towards OT goals: Progressing toward goals  Acute Rehab OT Goals Patient Stated Goal: to improve pain OT Goal Formulation: With patient/family Time For Goal Achievement: 10/19/21 Potential to Achieve Goals: Good  Plan Discharge plan remains appropriate;Frequency remains appropriate    Co-evaluation                 AM-PAC OT "6 Clicks" Daily Activity     Outcome Measure   Help from another person eating meals?: None Help from another person taking care of personal grooming?: A Little Help from another person toileting, which includes using toliet, bedpan, or urinal?: A Little Help from another person bathing (including washing, rinsing, drying)?: A Little Help from another person to put on and taking off regular upper body clothing?: A Little Help from another person to put on and taking off regular lower body clothing?: A Little 6 Click Score: 19    End of Session Equipment Utilized During Treatment: Gait belt;Rolling walker (2 wheels)  OT Visit Diagnosis: History of falling (Z91.81);Other abnormalities of gait and mobility (R26.89)   Activity Tolerance Patient limited by pain   Patient Left with call bell/phone within reach;with chair alarm set;with family/visitor present;in chair   Nurse Communication Patient requests pain meds        Time: 9242-6834 OT Time Calculation (min): 16 min  Charges: OT General Charges $OT Visit: 1 Visit OT Treatments $Self Care/Home Management : 8-22 mins  Ardeth Perfect., MPH, MS, OTR/L ascom [phone] 10/11/21, 1:30 PM

## 2021-10-11 NOTE — Progress Notes (Signed)
[place] for Warfarin  Indication: atrial fibrillation   Allergies  Allergen Reactions   Flagyl [Metronidazole] Nausea Only   Pacerone [Amiodarone]     Eye deposits   Ramipril Cough    Patient Measurements: Height: 5' (152.4 cm) Weight: 49.9 kg (110 lb) IBW/kg (Calculated) : 45.5  Vital Signs: Temp: 98 F (36.7 C) (10/06 0603) BP: 141/74 (10/06 0603) Pulse Rate: 74 (10/06 0603)  Labs: Recent Labs    10/09/21 0414 10/10/21 0449 10/11/21 0609  HGB 13.3 12.1 12.8  HCT 41.7 38.2 39.8  PLT 203 233 234  LABPROT 30.5* 31.6* 25.2*  INR 3.0* 3.1* 2.3*  CREATININE  --  0.84 0.64     Estimated Creatinine Clearance: 36.9 mL/min (by C-G formula based on SCr of 0.64 mg/dL).   Medical History: Past Medical History:  Diagnosis Date   Asthma    Breast cancer ([place]) 2010   RIGHT   Cancer ([place])    basel cell, s/p MOH'S/DR. 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  home warfarin dose: 2 MG tablet every Sunday/Wednesday and 1 mg all other days  Goal of Therapy:  INR 2-3  Date INR Dose 9/29 1.7 '2mg'$  9/30 1.9 '2mg'$  10/1 2.4 '1mg'$  10/2 2.8 -- 10/3 2.7 2 mg 10/4 3.0 -- 10/5 3.1 -- 10/6  2.3   Drug interactions: ceftriaxone > cephalexin   Plan: INR wnl but with 0.8 drop Will order '2mg'$  x 1 today. INR expected to drop again most likely d/t dose being held x 2 days. Can likely return to home dose tomorrow Follow up PT-INR and CBC tomorrow AM   Pearla Dubonnet, PharmD Clinical Pharmacist 10/11/2021 9:19 AM

## 2021-10-11 NOTE — TOC Transition Note (Signed)
Transition of Care [facility]) - CM/SW Discharge Note   Patient Details  Name: Laura David MRN: [id number] Date of Birth: 03-14-1934  [facility] CM/SW Contact:  [name], RN Phone Number: 10/11/2021, 12:44 PM   Clinical Narrative:    12:29pm Attempt to reach patient's son Laura David. No answer. Left a message.  12:36 pm  Spoke with patient's son, Laura David regarding discharge plan. Patient son stated he was unable to care for patient. Patient son advised patient would not inpatient level of care and was medically stable for discharge per MD. Laura David advised 24 hour care may be provided via Home Care services and would be private pay.  Strongly encouraged Laura David to contact the DSS for LTC medicaid for patient to be admitted to ALF/ memory care in the future.  Advised HH would contact him upon patient's discharge. Son is at her bedside and will transport her home. TOC signing off.        Barriers to Discharge: Continued Medical Work up   Patient Goals and CMS Choice Patient states their goals for this hospitalization and ongoing recovery are:: home with home health CMS Medicare.gov Compare Post Acute Care list provided to:: Patient Represenative (must comment) Choice offered to / list presented to : Adult Children  Discharge Placement                       Discharge Plan and Services                          HH Arranged: PT, OT, RN, Nurse's Aide [facility] Agency: [place] Date [facility] Agency Contacted: 10/06/21   Representative spoke with at [place]: [place] ([place]) Interventions     Readmission Risk Interventions    01/13/2020    4:33 PM  Readmission Risk Prevention Plan  Transportation Screening Complete  Medication Review (RN CM) Complete

## 2021-10-17 ENCOUNTER — Emergency Department: Payer: Medicare Other

## 2021-10-17 DIAGNOSIS — Z83438 Family history of other disorder of lipoprotein metabolism and other lipidemia: Secondary | ICD-10-CM

## 2021-10-17 DIAGNOSIS — M75101 Unspecified rotator cuff tear or rupture of right shoulder, not specified as traumatic: Secondary | ICD-10-CM | POA: Diagnosis present

## 2021-10-17 DIAGNOSIS — Z79899 Other long term (current) drug therapy: Secondary | ICD-10-CM

## 2021-10-17 DIAGNOSIS — Z8249 Family history of ischemic heart disease and other diseases of the circulatory system: Secondary | ICD-10-CM

## 2021-10-17 DIAGNOSIS — Z20822 Contact with and (suspected) exposure to covid-19: Secondary | ICD-10-CM | POA: Diagnosis present

## 2021-10-17 DIAGNOSIS — F32A Depression, unspecified: Secondary | ICD-10-CM | POA: Diagnosis present

## 2021-10-17 DIAGNOSIS — F03911 Unspecified dementia, unspecified severity, with agitation: Secondary | ICD-10-CM | POA: Diagnosis not present

## 2021-10-17 DIAGNOSIS — G8929 Other chronic pain: Secondary | ICD-10-CM | POA: Diagnosis present

## 2021-10-17 DIAGNOSIS — Z66 Do not resuscitate: Secondary | ICD-10-CM | POA: Diagnosis present

## 2021-10-17 DIAGNOSIS — E785 Hyperlipidemia, unspecified: Secondary | ICD-10-CM | POA: Diagnosis present

## 2021-10-17 DIAGNOSIS — J4489 Other specified chronic obstructive pulmonary disease: Secondary | ICD-10-CM | POA: Diagnosis present

## 2021-10-17 DIAGNOSIS — Z888 Allergy status to other drugs, medicaments and biological substances status: Secondary | ICD-10-CM

## 2021-10-17 DIAGNOSIS — Z833 Family history of diabetes mellitus: Secondary | ICD-10-CM

## 2021-10-17 DIAGNOSIS — Z9011 Acquired absence of right breast and nipple: Secondary | ICD-10-CM

## 2021-10-17 DIAGNOSIS — Z823 Family history of stroke: Secondary | ICD-10-CM

## 2021-10-17 DIAGNOSIS — Z801 Family history of malignant neoplasm of trachea, bronchus and lung: Secondary | ICD-10-CM

## 2021-10-17 DIAGNOSIS — E860 Dehydration: Secondary | ICD-10-CM | POA: Diagnosis present

## 2021-10-17 DIAGNOSIS — I10 Essential (primary) hypertension: Secondary | ICD-10-CM | POA: Diagnosis present

## 2021-10-17 DIAGNOSIS — M8580 Other specified disorders of bone density and structure, unspecified site: Secondary | ICD-10-CM | POA: Diagnosis present

## 2021-10-17 DIAGNOSIS — M19011 Primary osteoarthritis, right shoulder: Secondary | ICD-10-CM | POA: Diagnosis present

## 2021-10-17 DIAGNOSIS — I480 Paroxysmal atrial fibrillation: Secondary | ICD-10-CM | POA: Diagnosis present

## 2021-10-17 DIAGNOSIS — R296 Repeated falls: Secondary | ICD-10-CM | POA: Diagnosis present

## 2021-10-17 DIAGNOSIS — Z7901 Long term (current) use of anticoagulants: Secondary | ICD-10-CM

## 2021-10-17 DIAGNOSIS — Z853 Personal history of malignant neoplasm of breast: Secondary | ICD-10-CM

## 2021-10-17 DIAGNOSIS — F0393 Unspecified dementia, unspecified severity, with mood disturbance: Secondary | ICD-10-CM | POA: Diagnosis present

## 2021-10-17 DIAGNOSIS — K219 Gastro-esophageal reflux disease without esophagitis: Secondary | ICD-10-CM | POA: Diagnosis present

## 2021-10-17 DIAGNOSIS — Z8589 Personal history of malignant neoplasm of other organs and systems: Secondary | ICD-10-CM

## 2021-10-17 DIAGNOSIS — R451 Restlessness and agitation: Secondary | ICD-10-CM | POA: Diagnosis not present

## 2021-10-17 DIAGNOSIS — F05 Delirium due to known physiological condition: Secondary | ICD-10-CM | POA: Diagnosis present

## 2021-10-17 LAB — CBC WITH DIFFERENTIAL/PLATELET
Abs Immature Granulocytes: 0.16 10*3/uL — ABNORMAL HIGH (ref 0.00–0.07)
Basophils Absolute: 0 10*3/uL (ref 0.0–0.1)
Basophils Relative: 0 %
Eosinophils Absolute: 0 10*3/uL (ref 0.0–0.5)
Eosinophils Relative: 0 %
HCT: 48.2 % — ABNORMAL HIGH (ref 36.0–46.0)
Hemoglobin: 16 g/dL — ABNORMAL HIGH (ref 12.0–15.0)
Immature Granulocytes: 1 %
Lymphocytes Relative: 12 %
Lymphs Abs: 1.8 10*3/uL (ref 0.7–4.0)
MCH: 30.8 pg (ref 26.0–34.0)
MCHC: 33.2 g/dL (ref 30.0–36.0)
MCV: 92.9 fL (ref 80.0–100.0)
Monocytes Absolute: 1.3 10*3/uL — ABNORMAL HIGH (ref 0.1–1.0)
Monocytes Relative: 9 %
Neutro Abs: 12.1 10*3/uL — ABNORMAL HIGH (ref 1.7–7.7)
Neutrophils Relative %: 78 %
Platelets: 335 10*3/uL (ref 150–400)
RBC: 5.19 MIL/uL — ABNORMAL HIGH (ref 3.87–5.11)
RDW: 13.2 % (ref 11.5–15.5)
WBC: 15.4 10*3/uL — ABNORMAL HIGH (ref 4.0–10.5)
nRBC: 0 % (ref 0.0–0.2)

## 2021-10-17 LAB — URINALYSIS, ROUTINE W REFLEX MICROSCOPIC
Bacteria, UA: NONE SEEN
Bilirubin Urine: NEGATIVE
Glucose, UA: NEGATIVE mg/dL
Ketones, ur: NEGATIVE mg/dL
Leukocytes,Ua: NEGATIVE
Nitrite: NEGATIVE
Protein, ur: NEGATIVE mg/dL
Specific Gravity, Urine: 1.009 (ref 1.005–1.030)
pH: 6 (ref 5.0–8.0)

## 2021-10-17 NOTE — ED Notes (Signed)
Patient transported to CT 

## 2021-10-17 NOTE — ED Triage Notes (Signed)
Recent treatment for UTI. Family reports pt increasingly confused and combative this evening with subjective fevers. Also reports fall from toilet today. Family expressing concern for recurrent UTI.

## 2021-10-17 NOTE — ED Provider Triage Note (Signed)
Emergency Medicine Provider Triage Evaluation Note  Laura David , a 87 y.o. female  was evaluated in triage.  Pt complains of altered mental status, combativeness.  Per the son the patient has been altered today.  Complaining of feeling hot.  Patient is typically cold.  She has been aggressive, not her normal self.  Patient also had a fall this afternoon.  Unsure whether she hit her head or not.  She has been complaining of some hip pain, elbow pain.  She was recently admitted with a UTI for a week.  No cough, shortness of breath, emesis.  Review of Systems  Positive: Altered mental status, aggressiveness, fall Negative: Emesis, diarrhea, constipation, fevers, chills, URI symptoms  Physical Exam  BP (!) 166/92 (BP Location: Left Arm)   Pulse 75   Temp 98.3 F (36.8 C) (Oral)   Resp 16   Ht '5\' 1"'$  (1.549 m)   Wt 48.1 kg   SpO2 100%   BMI 20.03 kg/m  Gen:   Awake, no distress   Resp:  Normal effort  MSK:   M slight abrasions noted to elbows.  No deformity.  Good range of motion.  No frank tenderness over the hips. Other:    Medical Decision Making  Medically screening exam initiated at 11:25 PM.  Appropriate orders placed.  Laura David was informed that the remainder of the evaluation will be completed by another provider, this initial triage assessment does not replace that evaluation, and the importance of remaining in the ED until their evaluation is complete.  Patient with confusion, altered mental status, fall.  Patient will have labs, imaging, urinalysis at this time.   Darletta Moll, PA-C 10/17/21 2325

## 2021-10-17 NOTE — ED Notes (Signed)
Pt assisted to bathroom at this time. Urine sample obtained and sent to the lab.

## 2021-10-18 ENCOUNTER — Encounter
Admission: EM | Disposition: A | Discharge status: Hospice | Payer: Self-pay | Source: Home / Self Care | Attending: Hospitalist

## 2021-10-18 ENCOUNTER — Inpatient Hospital Stay: Payer: Medicare Other

## 2021-10-18 ENCOUNTER — Inpatient Hospital Stay
Admission: EM | Admit: 2021-10-18 | Discharge: 2021-11-01 | DRG: 884 | Disposition: A | Discharge status: Hospice | Payer: Medicare Other | Attending: Internal Medicine | Admitting: Internal Medicine

## 2021-10-18 DIAGNOSIS — Z801 Family history of malignant neoplasm of trachea, bronchus and lung: Secondary | ICD-10-CM | POA: Diagnosis not present

## 2021-10-18 DIAGNOSIS — F0393 Unspecified dementia, unspecified severity, with mood disturbance: Secondary | ICD-10-CM | POA: Diagnosis present

## 2021-10-18 DIAGNOSIS — Z8249 Family history of ischemic heart disease and other diseases of the circulatory system: Secondary | ICD-10-CM | POA: Diagnosis not present

## 2021-10-18 DIAGNOSIS — Z66 Do not resuscitate: Secondary | ICD-10-CM | POA: Diagnosis present

## 2021-10-18 DIAGNOSIS — Z853 Personal history of malignant neoplasm of breast: Secondary | ICD-10-CM | POA: Diagnosis not present

## 2021-10-18 DIAGNOSIS — R451 Restlessness and agitation: Secondary | ICD-10-CM | POA: Diagnosis not present

## 2021-10-18 DIAGNOSIS — Z20822 Contact with and (suspected) exposure to covid-19: Secondary | ICD-10-CM | POA: Diagnosis present

## 2021-10-18 DIAGNOSIS — Z7901 Long term (current) use of anticoagulants: Secondary | ICD-10-CM | POA: Diagnosis not present

## 2021-10-18 DIAGNOSIS — E860 Dehydration: Secondary | ICD-10-CM | POA: Diagnosis not present

## 2021-10-18 DIAGNOSIS — F05 Delirium due to known physiological condition: Secondary | ICD-10-CM | POA: Diagnosis not present

## 2021-10-18 DIAGNOSIS — I480 Paroxysmal atrial fibrillation: Secondary | ICD-10-CM | POA: Diagnosis not present

## 2021-10-18 DIAGNOSIS — R296 Repeated falls: Secondary | ICD-10-CM | POA: Diagnosis present

## 2021-10-18 DIAGNOSIS — F03918 Unspecified dementia, unspecified severity, with other behavioral disturbance: Secondary | ICD-10-CM | POA: Diagnosis present

## 2021-10-18 DIAGNOSIS — M25511 Pain in right shoulder: Secondary | ICD-10-CM | POA: Diagnosis present

## 2021-10-18 DIAGNOSIS — I10 Essential (primary) hypertension: Secondary | ICD-10-CM | POA: Diagnosis present

## 2021-10-18 DIAGNOSIS — M8580 Other specified disorders of bone density and structure, unspecified site: Secondary | ICD-10-CM | POA: Diagnosis present

## 2021-10-18 DIAGNOSIS — Z7189 Other specified counseling: Secondary | ICD-10-CM | POA: Diagnosis not present

## 2021-10-18 DIAGNOSIS — E785 Hyperlipidemia, unspecified: Secondary | ICD-10-CM | POA: Diagnosis present

## 2021-10-18 DIAGNOSIS — F0390 Unspecified dementia without behavioral disturbance: Secondary | ICD-10-CM | POA: Diagnosis not present

## 2021-10-18 DIAGNOSIS — Z9011 Acquired absence of right breast and nipple: Secondary | ICD-10-CM | POA: Diagnosis not present

## 2021-10-18 DIAGNOSIS — G8929 Other chronic pain: Secondary | ICD-10-CM | POA: Diagnosis present

## 2021-10-18 DIAGNOSIS — K219 Gastro-esophageal reflux disease without esophagitis: Secondary | ICD-10-CM | POA: Diagnosis present

## 2021-10-18 DIAGNOSIS — F32A Depression, unspecified: Secondary | ICD-10-CM | POA: Diagnosis present

## 2021-10-18 DIAGNOSIS — D72829 Elevated white blood cell count, unspecified: Secondary | ICD-10-CM | POA: Diagnosis present

## 2021-10-18 DIAGNOSIS — Z823 Family history of stroke: Secondary | ICD-10-CM | POA: Diagnosis not present

## 2021-10-18 DIAGNOSIS — F03911 Unspecified dementia, unspecified severity, with agitation: Secondary | ICD-10-CM | POA: Diagnosis present

## 2021-10-18 DIAGNOSIS — Z8589 Personal history of malignant neoplasm of other organs and systems: Secondary | ICD-10-CM | POA: Diagnosis not present

## 2021-10-18 DIAGNOSIS — M75101 Unspecified rotator cuff tear or rupture of right shoulder, not specified as traumatic: Secondary | ICD-10-CM | POA: Diagnosis present

## 2021-10-18 DIAGNOSIS — J4489 Other specified chronic obstructive pulmonary disease: Secondary | ICD-10-CM | POA: Diagnosis present

## 2021-10-18 DIAGNOSIS — M19011 Primary osteoarthritis, right shoulder: Secondary | ICD-10-CM | POA: Diagnosis present

## 2021-10-18 LAB — COMPREHENSIVE METABOLIC PANEL
ALT: 34 U/L (ref 0–44)
AST: 39 U/L (ref 15–41)
Albumin: 4.5 g/dL (ref 3.5–5.0)
Alkaline Phosphatase: 57 U/L (ref 38–126)
Anion gap: 9 (ref 5–15)
BUN: 23 mg/dL (ref 8–23)
CO2: 24 mmol/L (ref 22–32)
Calcium: 9.9 mg/dL (ref 8.9–10.3)
Chloride: 104 mmol/L (ref 98–111)
Creatinine, Ser: 0.73 mg/dL (ref 0.44–1.00)
GFR, Estimated: >60 mL/min (ref >60)
Glucose, Bld: 100 mg/dL — ABNORMAL HIGH (ref 70–99)
Potassium: 4.2 mmol/L (ref 3.5–5.1)
Sodium: 137 mmol/L (ref 135–145)
Total Bilirubin: 1 mg/dL (ref 0.3–1.2)
Total Protein: 8.2 g/dL — ABNORMAL HIGH (ref 6.5–8.1)

## 2021-10-18 LAB — LACTIC ACID, PLASMA
Lactic Acid, Venous: 1.8 mmol/L (ref 0.5–1.9)
Lactic Acid, Venous: 1.9 mmol/L (ref 0.5–1.9)

## 2021-10-18 LAB — AMMONIA: Ammonia: 13 umol/L (ref 9–35)

## 2021-10-18 LAB — RESP PANEL BY RT-PCR (FLU A&B, COVID) ARPGX2
Influenza A by PCR: NEGATIVE
Influenza B by PCR: NEGATIVE
SARS Coronavirus 2 by RT PCR: NEGATIVE

## 2021-10-18 LAB — PROTIME-INR
INR: 2.1 — ABNORMAL HIGH (ref 0.8–1.2)
Prothrombin Time: 23.1 seconds — ABNORMAL HIGH (ref 11.4–15.2)

## 2021-10-18 LAB — TROPONIN I (HIGH SENSITIVITY)
Troponin I (High Sensitivity): 8 ng/L (ref <18)
Troponin I (High Sensitivity): 9 ng/L (ref <18)

## 2021-10-18 LAB — TSH: TSH: 0.924 u[IU]/mL (ref 0.350–4.500)

## 2021-10-18 SURGERY — BYPASS GRAFT FEMORAL-POPLITEAL ARTERY
Anesthesia: Choice

## 2021-10-18 MED ORDER — ATORVASTATIN CALCIUM 10 MG PO TABS
10.0000 mg | ORAL_TABLET | Freq: Every day | ORAL | Status: DC
  Administered 2021-10-18 – 2021-11-01 (×14): 10 mg via ORAL
  Filled 2021-10-18 (×9): qty 1
  Filled 2021-10-18: qty 0.5
  Filled 2021-10-18 (×5): qty 1

## 2021-10-18 MED ORDER — WARFARIN - PHARMACIST DOSING INPATIENT
Freq: Every day | Status: DC
  Filled 2021-10-18: qty 1

## 2021-10-18 MED ORDER — SODIUM CHLORIDE 0.9 % IV SOLN: 250.0000 mL | INTRAVENOUS | Status: DC | PRN

## 2021-10-18 MED ORDER — ONDANSETRON HCL 4 MG/2ML IJ SOLN: 4.0000 mg | Freq: Four times a day (QID) | INTRAMUSCULAR | Status: DC | PRN

## 2021-10-18 MED ORDER — AMLODIPINE BESYLATE 5 MG PO TABS
2.5000 mg | ORAL_TABLET | Freq: Every day | ORAL | Status: DC
  Administered 2021-10-18 – 2021-10-23 (×6): 2.5 mg via ORAL
  Filled 2021-10-18 (×6): qty 1

## 2021-10-18 MED ORDER — METOPROLOL TARTRATE 25 MG PO TABS
25.0000 mg | ORAL_TABLET | Freq: Two times a day (BID) | ORAL | Status: DC
  Administered 2021-10-18 – 2021-10-23 (×11): 25 mg via ORAL
  Filled 2021-10-18 (×11): qty 1

## 2021-10-18 MED ORDER — RISPERIDONE 0.25 MG PO TABS
0.2500 mg | ORAL_TABLET | Freq: Two times a day (BID) | ORAL | Status: DC | PRN
  Administered 2021-10-18: 0.25 mg via ORAL
  Filled 2021-10-18 (×2): qty 1

## 2021-10-18 MED ORDER — ALENDRONATE SODIUM 70 MG PO TABS: 70.0000 mg | ORAL_TABLET | ORAL | Status: DC

## 2021-10-18 MED ORDER — SODIUM CHLORIDE 0.9% FLUSH
3.0000 mL | Freq: Two times a day (BID) | INTRAVENOUS | Status: DC
  Administered 2021-10-19 – 2021-10-31 (×23): 3 mL via INTRAVENOUS

## 2021-10-18 MED ORDER — DIVALPROEX SODIUM 125 MG PO DR TAB
125.0000 mg | DELAYED_RELEASE_TABLET | Freq: Two times a day (BID) | ORAL | Status: DC
  Administered 2021-10-18 – 2021-11-01 (×29): 125 mg via ORAL
  Filled 2021-10-18 (×29): qty 1

## 2021-10-18 MED ORDER — WARFARIN SODIUM 1 MG PO TABS
1.0000 mg | ORAL_TABLET | ORAL | Status: DC
  Administered 2021-10-18: 1 mg via ORAL
  Filled 2021-10-18 (×2): qty 1

## 2021-10-18 MED ORDER — SODIUM CHLORIDE 0.9 % IV SOLN: INTRAVENOUS | Status: DC

## 2021-10-18 MED ORDER — SODIUM CHLORIDE 0.9% FLUSH: 3.0000 mL | INTRAVENOUS | Status: DC | PRN

## 2021-10-18 MED ORDER — OXYCODONE HCL 5 MG PO TABS: 2.5000 mg | ORAL_TABLET | Freq: Three times a day (TID) | ORAL | Status: DC | PRN

## 2021-10-18 MED ORDER — LIDOCAINE 5 % EX PTCH
1.0000 | MEDICATED_PATCH | CUTANEOUS | Status: DC
  Administered 2021-10-18 – 2021-11-01 (×14): 1 via TRANSDERMAL
  Filled 2021-10-18 (×15): qty 1

## 2021-10-18 MED ORDER — ACETAMINOPHEN 650 MG RE SUPP: 650.0000 mg | Freq: Four times a day (QID) | RECTAL | Status: DC | PRN

## 2021-10-18 MED ORDER — MEMANTINE HCL 5 MG PO TABS
10.0000 mg | ORAL_TABLET | Freq: Every day | ORAL | Status: DC
  Administered 2021-10-18 – 2021-11-01 (×15): 10 mg via ORAL
  Filled 2021-10-18 (×15): qty 2

## 2021-10-18 MED ORDER — ACETAMINOPHEN 325 MG PO TABS
650.0000 mg | ORAL_TABLET | Freq: Four times a day (QID) | ORAL | Status: DC | PRN
  Filled 2021-10-18: qty 2

## 2021-10-18 MED ORDER — MIRTAZAPINE 15 MG PO TABS
7.5000 mg | ORAL_TABLET | Freq: Every day | ORAL | Status: DC
  Administered 2021-10-18 – 2021-10-31 (×14): 7.5 mg via ORAL
  Filled 2021-10-18 (×14): qty 1

## 2021-10-18 MED ORDER — WARFARIN SODIUM 2 MG PO TABS: 2.0000 mg | ORAL_TABLET | ORAL | Status: DC

## 2021-10-18 MED ORDER — ONDANSETRON HCL 4 MG PO TABS: 4.0000 mg | ORAL_TABLET | Freq: Four times a day (QID) | ORAL | Status: DC | PRN

## 2021-10-18 NOTE — ED Notes (Signed)
Pt awake. A&Ox4 talking with NP. Son at bedside. Waiting for a further plan of care for pt.

## 2021-10-18 NOTE — Consult Note (Addendum)
ANTICOAGULATION CONSULT NOTE - Initial Consult  Pharmacy Consult for warfarin Indication: atrial fibrillation  Allergies  Allergen Reactions   Flagyl [Metronidazole] Nausea Only   Pacerone [Amiodarone]     Eye deposits   Ramipril Cough    Patient Measurements: Height: '5\' 1"'$  (154.9 cm) Weight: 48.1 kg (106 lb) IBW/kg (Calculated) : 47.8  Vital Signs: Temp: 98.2 F (36.8 C) (10/13 0434) Temp Source: Oral (10/13 0434) BP: 166/90 (10/13 0434) Pulse Rate: 74 (10/13 0434)  Labs: Recent Labs    10/17/21 2342 10/18/21 0122  HGB 16.0*  --   HCT 48.2*  --   PLT 335  --   LABPROT 23.1*  --   INR 2.1*  --   CREATININE 0.73  --   TROPONINIHS 9 8    Estimated Creatinine Clearance: 38.8 mL/min (by C-G formula based on SCr of 0.73 mg/dL).   Medical History: Past Medical History:  Diagnosis Date   Asthma    Breast cancer ([place]) 2010   RIGHT   Cancer ([place])    basel cell, s/p MOH'S/DR. 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 Regarding paroxysmal Afib, patient's CHADSVASc is 5, HR stable and WNL, and INR is currently at goal 2-3.  Drug-drug interactions: None significant  Date INR Warfarin Dose 10/13 2.1  Goal of Therapy:  INR 2-3   Plan:  Give warfarin 1 mg today in accordance with home regimen Given pt's presentation was related to psychiatric issues and Afib is currently stable, standing warfarin order placed in accordance with home regimen Adjust as  necessary if INR not at goal Monitor INR daily  Dara Hoyer, PharmD PGY-1 Pharmacy Resident 10/18/2021 10:14 AM

## 2021-10-18 NOTE — Evaluation (Signed)
Physical Therapy Evaluation Patient Details Name: Laura David MRN: [id number] DOB: 12-19-1934 Today's Date: 10/18/2021  History of Present Illness  Pt is an 87 y/o F admitted on 10/18/21 after being brought in by her family 2/2 confusion worse than baseline, aggressive & combative behaviors, recurrent falls & not eating/drinking well. Pt is being treated for dehydration, recurrent falls & progressive dementia with recurrent behavioral issues related to paranoia & aggression. PMH: HTN, dyslipidemia, PAF on warfarin, breast CA, SDH, COPD/asthma, recurrent falls, dementa, R rotator cuff tear & arthritis  Clinical Impression  Pt seen for PT evaluation with pt's son Lennette Bihari) present for session. Lennette Bihari reports pt was living alone 2 months ago, but he has been providing assistance recently. Pt uses walker for ambulation "when she remembers" but endorses 2 falls in the past month. On this date, pt requires mod assist for supine>sit, sit>supine with supervision. Pt is able to stand & ambulate in hallway with RW & CGA<>min assist with gait pattern as noted below. Pt demonstrates poor ability to ambulate inside of base of AD despite cuing & demonstrates shuffled, step to gait pattern. Pt's son reports he works and cannot provide 24 hr supervision, which pt will require to safely d/c home at this time. Will recommend STR upon d/c to maximize independence with functional mobility & reduce fall risk prior to return home.   Recommendations for follow up therapy are one component of a multi-disciplinary discharge planning process, led by the attending physician.  Recommendations may be updated based on patient status, additional functional criteria and insurance authorization.  Follow Up Recommendations Skilled nursing-short term rehab (<3 hours/day)      Assistance Recommended at Discharge Frequent or constant Supervision/Assistance  Patient can return home with the following  A little help with walking and/or  transfers;A little help with bathing/dressing/bathroom;Assist for transportation;Help with stairs or ramp for entrance;Assistance with cooking/housework;Direct supervision/assist for financial management;Direct supervision/assist for medications management;Assistance with feeding    Equipment Recommendations None recommended by PT  Recommendations for Other Services       Functional Status Assessment Patient has had a recent decline in their functional status and demonstrates the ability to make significant improvements in function in a reasonable and predictable amount of time.     Precautions / Restrictions Precautions Precautions: Fall Restrictions Weight Bearing Restrictions: No      Mobility  Bed Mobility Overal bed mobility: Needs Assistance Bed Mobility: Supine to Sit, Sit to Supine     Supine to sit: Mod assist, HOB elevated Sit to supine: Supervision, HOB elevated        Transfers Overall transfer level: Needs assistance Equipment used: Rolling walker (2 wheels) Transfers: Sit to/from Stand Sit to Stand: Supervision, Min guard           General transfer comment: cuing for hand placement on RW    Ambulation/Gait Ambulation/Gait assistance: Min guard, Min assist Gait Distance (Feet): 30 Feet Assistive device: Rolling walker (2 wheels) Gait Pattern/deviations: Step-to pattern, Decreased step length - right, Decreased step length - left, Decreased dorsiflexion - right, Decreased dorsiflexion - left, Decreased stride length, Shuffle Gait velocity: decreased     General Gait Details: Max cuing but poor ability to ambulate within base of AD, pushes it out in front of her.  Stairs            Wheelchair Mobility    Modified Rankin (Stroke Patients Only)       Balance Overall balance assessment: Needs assistance, History of Falls Sitting-balance  support: Feet supported Sitting balance-Leahy Scale: Good     Standing balance support: During  functional activity, Bilateral upper extremity supported, Reliant on assistive device for balance Standing balance-Leahy Scale: Fair                               Pertinent Vitals/Pain Pain Assessment Pain Assessment: Faces Faces Pain Scale: Hurts a little bit Pain Location: back Pain Descriptors / Indicators: Discomfort Pain Intervention(s): Repositioned, Monitored during session    [place] expects to be discharged to:: Private residence Living Arrangements: Alone Available Help at Discharge: Family;Available PRN/intermittently Type of Home: House Home Access: Stairs to enter Entrance Stairs-Rails: Right;Left;Can reach both Entrance Stairs-Number of Steps: 3   Home Layout: One level Home Equipment: Conservation officer, nature (2 wheels);Rollator (4 wheels);Shower seat      Prior Function Prior Level of Function : Independent/Modified Independent;History of Falls (last six months)             Mobility Comments: Pt was living alone 2 months ago. Son has been providing heavy supervision/assistance recently but he works. Pt with at least 2 falls this past month. Ambulatory with rollator "when she remembers to use it" per her son ADLs Comments: assist for IADLs, son organizes medication however pt frequently forgets to take it. assist for meals. DiL assists with showers     Hand Dominance   Dominant Hand: Right    Extremity/Trunk Assessment   Upper Extremity Assessment Upper Extremity Assessment: RUE deficits/detail RUE Deficits / Details: Pt with hx of RUE rotator cuff injury (received steroid shot). Pt reports difficulty feeding herself with dominant hand.    Lower Extremity Assessment Lower Extremity Assessment: Generalized weakness       Communication   Communication: No difficulties  Cognition Arousal/Alertness: Awake/alert Behavior During Therapy: WFL for tasks assessed/performed Overall Cognitive Status: History of cognitive impairments -  at baseline                                 General Comments: Pt is oriented to self, reports she's in [place], correctly selects hospital from choice of 2. Follows simple commands throughout session with extra time.        General Comments      Exercises     Assessment/Plan    PT Assessment Patient needs continued PT services  PT Problem List Decreased strength;Decreased mobility;Decreased range of motion;Decreased activity tolerance;Decreased balance;Decreased knowledge of use of DME;Decreased cognition;Pain;Decreased safety awareness       PT Treatment Interventions DME instruction;Therapeutic exercise;Gait training;Balance training;Functional mobility training;Stair training;Neuromuscular re-education;Therapeutic activities;Patient/family education    PT Goals (Current goals can be found in the Care Plan section)  Acute Rehab PT Goals Patient Stated Goal: none stated PT Goal Formulation: With patient/family Time For Goal Achievement: 11/01/21 Potential to Achieve Goals: Fair    Frequency Min 2X/week     Co-evaluation               AM-PAC PT "6 Clicks" Mobility  Outcome Measure Help needed turning from your back to your side while in a flat bed without using bedrails?: A Little Help needed moving from lying on your back to sitting on the side of a flat bed without using bedrails?: A Lot Help needed moving to and from a bed to a chair (including a wheelchair)?: A Little Help needed standing up from a chair using  your arms (e.g., wheelchair or bedside chair)?: A Little Help needed to walk in hospital room?: A Little Help needed climbing 3-5 steps with a railing? : A Lot 6 Click Score: 16    End of Session   Activity Tolerance: Patient tolerated treatment well Patient left: in bed;with call bell/phone within reach;with bed alarm set Nurse Communication: Mobility status PT Visit Diagnosis: History of falling (Z91.81);Muscle weakness (generalized)  (M62.81);Other abnormalities of gait and mobility (R26.89);Unsteadiness on feet (R26.81)    Time: 5465-6812 PT Time Calculation (min) (ACUTE ONLY): 19 min   Charges:   PT Evaluation $PT Eval Moderate Complexity: [place], PT, DPT 10/18/21, 4:15 PM   Waunita Schooner 10/18/2021, 4:12 PM

## 2021-10-18 NOTE — Progress Notes (Signed)

## 2021-10-18 NOTE — Evaluation (Signed)
Occupational Therapy Evaluation Patient Details Name: Laura David MRN: [id number] DOB: 27-Dec-1934 Today's Date: 10/18/2021   History of Present Illness Pt is an 87 y/o F admitted on 10/18/21 after being brought in by her family 2/2 confusion worse than baseline, aggressive & combative behaviors, recurrent falls & not eating/drinking well. Pt is being treated for dehydration, recurrent falls & progressive dementia with recurrent behavioral issues related to paranoia & aggression. PMH: HTN, dyslipidemia, PAF on warfarin, breast CA, SDH, COPD/asthma, recurrent falls, dementa, R rotator cuff tear & arthritis   Clinical Impression   Patient presenting with decreased independence in self-care, functional mobility, safety, cognition, and endurance. Patients son present and assisting in providing home set up and PLOF. Reports she lives alone, but he has been assisting her 24/7, at discharge he will be able to assist PRN. Patient has a walk-in shower,, shower chair, regular toilet, RW, and rollator. At baseline son proivdes assistance for medication, meal prep, and bathing tasks (50% assistance) Patient reports 6 falls this year. Patient currently functioning at min guard/ supervision for transfers sit <> stand. Patient was able to ambulate ~100 ft using RW. Required max VC to properly use walker throughout session. Patient left in recliner with son present and all needs met. Patient will benefit from acute OT to increase overall independence in the areas of ADLs, functional mobility, in order to safely discharge to the next venue of care.     Recommendations for follow up therapy are one component of a multi-disciplinary discharge planning process, led by the attending physician.  Recommendations may be updated based on patient status, additional functional criteria and insurance authorization.   Follow Up Recommendations  Skilled nursing-short term rehab (<3 hours/day)    Assistance Recommended at  Discharge Intermittent Supervision/Assistance  Patient can return home with the following Assistance with cooking/housework;Direct supervision/assist for medications management;Help with stairs or ramp for entrance;A little help with walking and/or transfers;A little help with bathing/dressing/bathroom    Functional Status Assessment  Patient has had a recent decline in their functional status and demonstrates the ability to make significant improvements in function in a reasonable and predictable amount of time.  Equipment Recommendations  Other (comment) (Defer to next venue of care.)    Recommendations for Other Services       Precautions / Restrictions Precautions Precautions: Fall Restrictions Weight Bearing Restrictions: No      Mobility Bed Mobility               General bed mobility comments: Patient in recliner upon arrival    Transfers Overall transfer level: Needs assistance Equipment used: Rolling walker (2 wheels) Transfers: Sit to/from Stand Sit to Stand: Supervision, Min guard           General transfer comment: cued for hand placement on RW      Balance Overall balance assessment: Needs assistance   Sitting balance-Leahy Scale: Good     Standing balance support: During functional activity, Reliant on assistive device for balance, Bilateral upper extremity supported Standing balance-Leahy Scale: Fair                             ADL either performed or assessed with clinical judgement      Vision Baseline Vision/History: 1 Wears glasses Patient Visual Report: No change from baseline              Pertinent Vitals/Pain Pain Assessment Pain Assessment: No/denies pain  Hand Dominance Right   Extremity/Trunk Assessment Upper Extremity Assessment Upper Extremity Assessment: RUE deficits/detail RUE Deficits / Details: Pt with hx of RUE rotator cuff injury (received steroid shot). Pt reports difficulty feeding herself  with dominant hand.   Lower Extremity Assessment Lower Extremity Assessment: Generalized weakness       Communication Communication Communication: No difficulties   Cognition Arousal/Alertness: Awake/alert Behavior During Therapy: WFL for tasks assessed/performed Overall Cognitive Status: History of cognitive impairments - at baseline                                 General Comments: requires VC for sequencing, safety, initiation for tasks at times, follows commands                Home Living Family/patient expects to be discharged to:: Private residence Living Arrangements: Alone Available Help at Discharge: Family;Available PRN/intermittently Type of Home: House Home Access: Stairs to enter [employer] of Steps: 3 Entrance Stairs-Rails: Right;Left;Can reach both Home Layout: One level     Bathroom Shower/Tub: Walk-in shower         Home Equipment: Conservation officer, nature (2 wheels);Rollator (4 wheels);Shower seat          Prior Functioning/Environment Prior Level of Function : Independent/Modified Independent;History of Falls (last six months)             Mobility Comments: Pt was living alone 2 months ago. Son has been providing heavy supervision/assistance recently but he works. Pt with at least 2 falls this past month. Ambulatory with rollator "when she remembers to use it" per her son ADLs Comments: assist for IADLs, son organizes medication however pt frequently forgets to take it. assist for meals. DiL assists with showers        OT Problem List: Decreased strength;Decreased activity tolerance;Impaired balance (sitting and/or standing);Decreased safety awareness      OT Treatment/Interventions: Self-care/ADL training;Therapeutic exercise;Energy conservation;DME and/or AE instruction;Therapeutic activities;Balance training;Patient/family education    OT Goals(Current goals can be found in the care plan section) Acute Rehab OT  Goals Patient Stated Goal: to get better OT Goal Formulation: With patient/family Time For Goal Achievement: 11/01/21 Potential to Achieve Goals: Good ADL Goals Pt Will Perform Lower Body Bathing: with supervision Pt Will Perform Lower Body Dressing: with supervision Pt Will Transfer to Toilet: with modified independence Pt Will Perform Toileting - Clothing Manipulation and hygiene: with modified independence  OT Frequency: Min 2X/week       AM-PAC OT "6 Clicks" Daily Activity     Outcome Measure Help from another person eating meals?: None Help from another person taking care of personal grooming?: A Little Help from another person toileting, which includes using toliet, bedpan, or urinal?: A Little Help from another person bathing (including washing, rinsing, drying)?: A Little Help from another person to put on and taking off regular upper body clothing?: A Little Help from another person to put on and taking off regular lower body clothing?: A Little 6 Click Score: 19   End of Session Equipment Utilized During Treatment: Rolling walker (2 wheels) Nurse Communication: Mobility status  Activity Tolerance: Patient tolerated treatment well Patient left: in chair;with family/visitor present  OT Visit Diagnosis: History of falling (Z91.81);Other abnormalities of gait and mobility (R26.89);Muscle weakness (generalized) (M62.81)                Time: 1505-6979 OT Time Calculation (min): 23 min Charges:  OT General Charges $OT  Visit: 1 Visit OT Evaluation $OT Eval Low Complexity: 1 Low OT Treatments $Therapeutic Activity: 8-22 mins    Neave Lenger, OTS 10/18/2021, 4:11 PM

## 2021-10-18 NOTE — ED Notes (Signed)
Medications and fluids given pt tolerating well. Pt laying in bed resting.

## 2021-10-18 NOTE — ED Notes (Signed)
Pt a little confused as to self. Pt ambulated to bathroom with help of 1 assist. However when asked about self again pt was oriented.

## 2021-10-18 NOTE — ED Notes (Signed)
Physical Therapy and son at the bedside.

## 2021-10-18 NOTE — Consult Note (Signed)
[facility] Face-to-Face Psychiatry Consult   Reason for Consult:  agitation Referring Physician:  EDP Patient Identification: Laura David MRN:  [id number] Principal Diagnosis: Dehydration, moderate Diagnosis:  Principal Problem:   Dehydration, moderate Active Problems:   Agitation   Hypertension   Hyperlipidemia   PAF (paroxysmal atrial fibrillation) (HCC)   Leucocytosis   Anticoagulated on Coumadin   Dementia (HCC)   Recurrent falls   Total Time spent with patient: 45 minutes  Subjective:   Laura David is a 87 y.o. female patient admitted for a medical evaluation for a UTI and agitation.  HPI:  87 yo presented to the ED by family with concerns of a UTI and AMS, consult placed for agitation.  On assessment this morning, she is calm and cooperative, very articulate.  She reports, "My attitude is getting very nasty" especially with her son.  Moderate depression with no suicidal ideations.  "Anxious at times", no panic attacks.  Sleep is "not too good", appetite is fair.  Discussed medication options to assist with her agitation and taking it prior to her agitation in the afternoons to decrease her agitation, she is agreeable.  Psychiatrically cleared.  Past Psychiatric History: dementia  Risk to Self:  none Risk to Others:  none Prior Inpatient Therapy:  none Prior Outpatient Therapy:  none  Past Medical History:  Past Medical History:  Diagnosis Date   Asthma    Breast cancer ([place]) 2010   RIGHT   Cancer ([place])    basel cell, s/p MOH'S/DR. 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    Past Surgical History:  Procedure Laterality Date   ABDOMINAL HYSTERECTOMY  1980   D/T FIBROIDS   BLADDER SUSPENSION  1983   BREAST BIOPSY Left    benign    BREAST SURGERY Right 2010    MASTECTOMY/CA   COLONOSCOPY  2013   NORMAL   MASTECTOMY Right 2010   MOLES REMOVED  12/05/10   FROM SCALP   ORIF WRIST FRACTURE Left 01/13/2020   Procedure: OPEN REDUCTION INTERNAL FIXATION (ORIF) WRIST FRACTURE;  Surgeon: Hessie Knows, MD;  Location: [facility] ORS;  Service: Orthopedics;  Laterality: Left;   Family History:  Family History  Problem Relation Age of Onset   Heart disease Mother    Stroke Mother    Cancer Father        LUNG   Diabetes Brother    Heart disease Brother    Hyperlipidemia Brother    Hypertension Brother    Diabetes Brother    Heart disease Brother    Hyperlipidemia Brother    Hypertension Brother    Breast cancer Neg Hx    Family Psychiatric  History: none Social History:  Social History   Substance and Sexual Activity  Alcohol Use No     Social History   Substance and Sexual Activity  Drug Use Not Currently    Social History   Socioeconomic History   Marital status: Widowed    Spouse name: Not on file   Number of children: Not on file   Years of education: Not on file   Highest education level: Not on file  Occupational History   Not on file  Tobacco Use   Smoking status: Never  Smokeless tobacco: Never  Substance and Sexual Activity   Alcohol use: No   Drug use: Not Currently   Sexual activity: Not on file  Other Topics Concern   Not on file  Social History Narrative   Not on file   Social Determinants of Health   Financial Resource Strain: Not on file  Food Insecurity: No Food Insecurity (10/04/2021)   Hunger Vital Sign    Worried About Running Out of Food in the Last Year: Never true    Ran Out of Food in the Last Year: Never true  Transportation Needs: No Transportation Needs (10/04/2021)   PRAPARE - Hydrologist (Medical): No    Lack of Transportation (Non-Medical): No  Physical Activity: Not on file  Stress: Not on file  Social Connections: Not on file   Additional Social History:     Allergies:   Allergies  Allergen Reactions   Flagyl [Metronidazole] Nausea Only   Pacerone [Amiodarone]     Eye deposits   Ramipril Cough    Labs:  Results for orders placed or performed during the hospital encounter of 10/18/21 (from the past 48 hour(s))  Urinalysis, Routine w reflex microscopic     Status: Abnormal   Collection Time: 10/17/21 10:56 PM  Result Value Ref Range   Color, Urine STRAW (A) YELLOW   APPearance CLEAR (A) CLEAR   Specific Gravity, Urine 1.009 1.005 - 1.030   pH 6.0 5.0 - 8.0   Glucose, UA NEGATIVE NEGATIVE mg/dL   Hgb urine dipstick MODERATE (A) NEGATIVE   Bilirubin Urine NEGATIVE NEGATIVE   Ketones, ur NEGATIVE NEGATIVE mg/dL   Protein, ur NEGATIVE NEGATIVE mg/dL   Nitrite NEGATIVE NEGATIVE   Leukocytes,Ua NEGATIVE NEGATIVE   RBC / HPF 6-10 0 - 5 RBC/hpf   WBC, UA 0-5 0 - 5 WBC/hpf   Bacteria, UA NONE SEEN NONE SEEN   Squamous Epithelial / LPF 0-5 0 - 5   Mucus PRESENT     Comment: Performed at [facility], [address] [place], [place] [address]  Comprehensive metabolic panel     Status: Abnormal   Collection Time: 10/17/21 11:42 PM  Result Value Ref Range   Sodium 137 135 - 145 mmol/L   Potassium 4.2 3.5 - 5.1 mmol/L    Comment: HEMOLYSIS AT THIS LEVEL MAY AFFECT RESULT   Chloride 104 98 - 111 mmol/L   CO2 24 22 - 32 mmol/L   Glucose, Bld 100 (H) 70 - 99 mg/dL    Comment: Glucose reference range applies only to samples taken after fasting for at least 8 hours.   BUN 23 8 - 23 mg/dL   Creatinine, Ser 0.73 0.44 - 1.00 mg/dL   Calcium 9.9 8.9 - 10.3 mg/dL   Total Protein 8.2 (H) 6.5 - 8.1 g/dL   Albumin 4.5 3.5 - 5.0 g/dL   AST 39 15 - 41 U/L   ALT 34 0 - 44 U/L   Alkaline Phosphatase 57 38 - 126 U/L   Total Bilirubin 1.0 0.3 - 1.2 mg/dL   GFR, Estimated >60 >60 mL/min    Comment: (NOTE) Calculated using the CKD-EPI Creatinine Equation (2021)    Anion gap 9 5 - 15    Comment: Performed at [facility], [address] [place], [place] [address]  CBC with Differential     Status: Abnormal   Collection Time: 10/17/21 11:42 PM  Result Value Ref Range   WBC 15.4 (H)  4.0 - 10.5 K/uL   RBC 5.19 (H) 3.87 - 5.11 MIL/uL   Hemoglobin 16.0 (H) 12.0 - 15.0 g/dL   HCT 48.2 (H) 36.0 - 46.0 %   MCV 92.9 80.0 - 100.0 fL   MCH 30.8 26.0 - 34.0 pg   MCHC 33.2 30.0 - 36.0 g/dL   RDW 13.2 11.5 - 15.5 %   Platelets 335 150 - 400 K/uL   nRBC 0.0 0.0 - 0.2 %   Neutrophils Relative % 78 %   Neutro Abs 12.1 (H) 1.7 - 7.7 K/uL   Lymphocytes Relative 12 %   Lymphs Abs 1.8 0.7 - 4.0 K/uL   Monocytes Relative 9 %   Monocytes Absolute 1.3 (H) 0.1 - 1.0 K/uL   Eosinophils Relative 0 %   Eosinophils Absolute 0.0 0.0 - 0.5 K/uL   Basophils Relative 0 %   Basophils Absolute 0.0 0.0 - 0.1 K/uL   Immature Granulocytes 1 %   Abs Immature Granulocytes 0.16 (H) 0.00 - 0.07 K/uL    Comment: Performed at [facility], [address] [place], [place] [address]  Lactic acid, plasma     Status: None   Collection Time: 10/17/21 11:42 PM  Result Value Ref Range   Lactic Acid, Venous 1.9 0.5 - 1.9 mmol/L    Comment: Performed at [facility], [place], [place] [address]  Troponin I (High Sensitivity)     Status: None   Collection Time: 10/17/21 11:42 PM  Result Value Ref Range   Troponin I (High Sensitivity) 9 <18 ng/L    Comment: (NOTE) Elevated high sensitivity troponin I (hsTnI) values and significant  changes across serial measurements may suggest ACS but many other  chronic and acute conditions are known to elevate hsTnI results.  Refer to the "Links" section for chest pain algorithms and additional  guidance. Performed at [facility], [address] [place], [place] [address]   Ammonia     Status: None   Collection Time: 10/17/21 11:42 PM  Result Value Ref Range   Ammonia 13 9 - 35 umol/L    Comment: HEMOLYSIS AT THIS LEVEL MAY AFFECT RESULT Performed at  [facility], [address] [place], [place] [address]   Protime-INR     Status: Abnormal   Collection Time: 10/17/21 11:42 PM  Result Value Ref Range   Prothrombin Time 23.1 (H) 11.4 - 15.2 seconds   INR 2.1 (H) 0.8 - 1.2    Comment: (NOTE) INR goal varies based on device and disease states. Performed at [facility], [address] [place], [place] [address]   Resp Panel by RT-PCR (Flu A&B, Covid) Anterior Nasal Swab     Status: None   Collection Time: 10/18/21  1:22 AM   Specimen: Anterior Nasal Swab  Result Value Ref Range   SARS Coronavirus 2 by RT PCR NEGATIVE NEGATIVE    Comment: (NOTE) SARS-CoV-2 target nucleic acids are NOT DETECTED.  The SARS-CoV-2 RNA is generally detectable in upper respiratory specimens during the acute phase of infection. The lowest concentration of SARS-CoV-2 viral copies this assay can detect is 138 copies/mL. A negative result does not preclude SARS-Cov-2 infection and should not be used as the sole basis for treatment or other patient management decisions. A negative result may occur with  improper specimen collection/handling, submission of specimen other than nasopharyngeal swab, presence of viral mutation(s) within the areas targeted by this assay, and inadequate number of viral copies(<138 copies/mL). A  negative result must be combined with clinical observations, patient history, and epidemiological information. The expected result is Negative.  Fact Sheet for Patients:  EntrepreneurPulse.com.au  Fact Sheet for Healthcare Providers:  IncredibleEmployment.be  This test is no t yet approved or cleared by the [place] FDA and  has been authorized for detection and/or diagnosis of SARS-CoV-2 by FDA under an Emergency Use Authorization (EUA). This EUA will remain  in effect (meaning this test can be used) for the duration of the COVID-19 declaration under Section 564(b)(1) of  the Act, 21 U.S.C.section 360bbb-3(b)(1), unless the authorization is terminated  or revoked sooner.       Influenza A by PCR NEGATIVE NEGATIVE   Influenza B by PCR NEGATIVE NEGATIVE    Comment: (NOTE) The Xpert Xpress SARS-CoV-2/FLU/RSV plus assay is intended as an aid in the diagnosis of influenza from Nasopharyngeal swab specimens and should not be used as a sole basis for treatment. Nasal washings and aspirates are unacceptable for Xpert Xpress SARS-CoV-2/FLU/RSV testing.  Fact Sheet for Patients: EntrepreneurPulse.com.au  Fact Sheet for Healthcare Providers: IncredibleEmployment.be  This test is not yet approved or cleared by the [place] FDA and has been authorized for detection and/or diagnosis of SARS-CoV-2 by FDA under an Emergency Use Authorization (EUA). This EUA will remain in effect (meaning this test can be used) for the duration of the COVID-19 declaration under Section 564(b)(1) of the Act, 21 U.S.C. section 360bbb-3(b)(1), unless the authorization is terminated or revoked.  Performed at [facility], [place], [place] [address]   Troponin I (High Sensitivity)     Status: None   Collection Time: 10/18/21  1:22 AM  Result Value Ref Range   Troponin I (High Sensitivity) 8 <18 ng/L    Comment: (NOTE) Elevated high sensitivity troponin I (hsTnI) values and significant  changes across serial measurements may suggest ACS but many other  chronic and acute conditions are known to elevate hsTnI results.  Refer to the "Links" section for chest pain algorithms and additional  guidance. Performed at [facility], [address] [place], [place] [address]   Lactic acid, plasma     Status: None   Collection Time: 10/18/21  1:24 AM  Result Value Ref Range   Lactic Acid, Venous 1.8 0.5 - 1.9 mmol/L    Comment: Performed at [facility], [address], [place], [place] [address]     Current Facility-Administered Medications  Medication Dose Route Frequency Provider Last Rate Last Admin   0.9 %  sodium chloride infusion  250 mL Intravenous PRN Samella Parr, NP       0.9 %  sodium chloride infusion   Intravenous Continuous Samella Parr, NP 50 mL/hr at 10/18/21 1015 New Bag at 10/18/21 1015   acetaminophen (TYLENOL) tablet 650 mg  650 mg Oral Q6H PRN Samella Parr, NP       Or   acetaminophen (TYLENOL) suppository 650 mg  650 mg Rectal Q6H PRN Samella Parr, NP       amLODipine (NORVASC) tablet 2.5 mg  2.5 mg Oral Daily Erin Hearing L, NP   2.5 mg at 10/18/21 1015   atorvastatin (LIPITOR) tablet 10 mg  10 mg Oral Daily Samella Parr, NP   10 mg at 10/18/21 1014   divalproex (DEPAKOTE) DR tablet 125 mg  125 mg Oral Q12H Tammela Bales, Asa Saunas, NP       lidocaine (LIDODERM) 5 % 1 patch  1 patch  Transdermal Q24H Samella Parr, NP   1 patch at 10/18/21 1036   metoprolol tartrate (LOPRESSOR) tablet 25 mg  25 mg Oral BID Samella Parr, NP   25 mg at 10/18/21 1033   ondansetron (ZOFRAN) tablet 4 mg  4 mg Oral Q6H PRN Samella Parr, NP       Or   ondansetron [facility]) injection 4 mg  4 mg Intravenous Q6H PRN Samella Parr, NP       oxyCODONE (Oxy IR/ROXICODONE) immediate release tablet 2.5 mg  2.5 mg Oral Q8H PRN Samella Parr, NP       risperiDONE (RISPERDAL) tablet 0.25 mg  0.25 mg Oral BID PRN Patrecia Pour, NP       sodium chloride flush (NS) 0.9 % injection 3 mL  3 mL Intravenous Q12H Samella Parr, NP       sodium chloride flush (NS) 0.9 % injection 3 mL  3 mL Intravenous PRN Samella Parr, NP       [START ON 10/20/2021] warfarin (COUMADIN) tablet 2 mg  2 mg Oral Once per day on Sun Wed Anderson, William M, [facility]       And   warfarin (COUMADIN) tablet 1 mg  1 mg Oral Once per day on Mon Tue Thu Fri Sat Delena Bali, [facility]       Warfarin - Pharmacist Dosing Inpatient   Does not apply [id number] Delena Bali, [facility]       Current  Outpatient Medications  Medication Sig Dispense Refill   amLODipine (NORVASC) 2.5 MG tablet Take 2.5 mg by mouth daily.     atorvastatin (LIPITOR) 10 MG tablet Take 10 mg by mouth at bedtime.     memantine (NAMENDA) 10 MG tablet Take 10 mg by mouth in the morning and at bedtime.     metoprolol tartrate (LOPRESSOR) 25 MG tablet Take 25 mg by mouth 2 (two) times daily.     mirtazapine (REMERON) 7.5 MG tablet Take 7.5 mg by mouth at bedtime.     warfarin (COUMADIN) 2 MG tablet Take 2 mg by mouth as directed. TAKE 1 TABLET BY MOUTH DAILY ON SUNDAY AND WEDNESDAY, AND 1/2 TABLET DAILY ALL OTHER DAYS     alendronate (FOSAMAX) 70 MG tablet Take 70 mg by mouth once a week. (Sundays)      Musculoskeletal: Strength & Muscle Tone: within normal limits Gait & Station: normal Patient leans: N/A  Psychiatric Specialty Exam: Physical Exam Vitals and nursing note reviewed.  Constitutional:      Appearance: Normal appearance.  HENT:     Head: Normocephalic.     Nose: Nose normal.  Pulmonary:     Effort: Pulmonary effort is normal.  Musculoskeletal:        General: Normal range of motion.     Cervical back: Normal range of motion.  Neurological:     General: No focal deficit present.     Mental Status: She is alert and oriented to person, place, and time.  Psychiatric:        Attention and Perception: Attention and perception normal.        Mood and Affect: Mood is anxious and depressed.        Speech: Speech normal.        Behavior: Behavior normal. Behavior is cooperative.        Thought Content: Thought content normal.        Cognition and Memory: Cognition and memory normal.  Judgment: Judgment normal.     Review of Systems  Psychiatric/Behavioral:  Positive for depression. The patient is nervous/anxious.   All other systems reviewed and are negative.   Blood pressure (!) 140/69, pulse 68, temperature 98 F (36.7 C), temperature source Oral, resp. rate 20, height '5\' 1"'$  (1.549  m), weight 48.1 kg, SpO2 98 %.Body mass index is 20.03 kg/m.  General Appearance: Casual  Eye Contact:  Good  Speech:  Normal Rate  Volume:  Normal  Mood:  Depressed  Affect:  Congruent  Thought Process:  Coherent and Descriptions of Associations: Intact  Orientation:  Full (Time, Place, and Person)  Thought Content:  WDL and Logical  Suicidal Thoughts:  No  Homicidal Thoughts:  No  Memory:  Immediate;   Fair Recent;   Fair Remote;   Fair  Judgement:  Fair  Insight:  Fair  Psychomotor Activity:  Normal  Concentration:  Concentration: Fair and Attention Span: Fair  Recall:  [employer] of Knowledge:  Good  Language:  Good  Akathisia:  No  Handed:  Right  AIMS (if indicated):     Assets:  Housing Leisure Time Resilience Social Support  ADL's:  Intact  Cognition:  Impaired,  Mild  Sleep:        Physical Exam: Physical Exam Vitals and nursing note reviewed.  Constitutional:      Appearance: Normal appearance.  HENT:     Head: Normocephalic.     Nose: Nose normal.  Pulmonary:     Effort: Pulmonary effort is normal.  Musculoskeletal:        General: Normal range of motion.     Cervical back: Normal range of motion.  Neurological:     General: No focal deficit present.     Mental Status: She is alert and oriented to person, place, and time.  Psychiatric:        Attention and Perception: Attention and perception normal.        Mood and Affect: Mood is anxious and depressed.        Speech: Speech normal.        Behavior: Behavior normal. Behavior is cooperative.        Thought Content: Thought content normal.        Cognition and Memory: Cognition and memory normal.        Judgment: Judgment normal.    Review of Systems  Psychiatric/Behavioral:  Positive for depression. The patient is nervous/anxious.   All other systems reviewed and are negative.  Blood pressure (!) 140/69, pulse 68, temperature 98 F (36.7 C), temperature source Oral, resp. rate 20, height 5'  1" (1.549 m), weight 48.1 kg, SpO2 98 %. Body mass index is 20.03 kg/m.  Treatment Plan Summary: Agitation, dementia with behavioral changes Started Depakote 125 mg BID Started Risperdal 0.25 mg BID PRN   Disposition: No evidence of imminent risk to self or others at present.   Patient does not meet criteria for psychiatric inpatient admission.  Waylan Boga, NP 10/18/2021 2:28 PM

## 2021-10-18 NOTE — H&P (Addendum)
History and Physical    Patient: Laura David IRC:[id number] DOB: 10/06/1934 DOA: 10/18/2021 DOS: the patient was seen and examined on 10/18/2021 PCP: [name], MD  Patient coming from: Home lives alone but does have son to assist with care as well as daughter-in-law  Chief Complaint:  Chief Complaint  Patient presents with   Altered Mental Status   Fall   HPI: Laura David is a 87 y.o. female with medical history significant of hypertension, dyslipidemia, PAF on warfarin, history of breast cancer, history of prior subdural hematoma, COPD/asthma, recurrent falls, known dementia, right shoulder pain secondary to rotator cuff tear and glenohumeral arthritis.  She was brought to the ER by her family due to behavioral changes, confusion worse than baseline as well as aggressive and combative behaviors at home.  She has also had recurrent falls and has not been eating and drinking well.  Was recently hospitalized and discharged on 10/6 after being treated for UTI as well as right shoulder pain.  Plans were for the patient to follow-up with orthopedic team regarding the shoulder pain after discharge.  Postdischarge patient has continued to have psychiatric issues with ongoing behavioral disturbance in relation to her dementia.  She has been quite paranoid, becomes angry and makes threats to family including physical threats by attempting to strike family members with her fist.  She also was demonstrating behaviors consistent with depression noting she confirmed depression symptoms when asked and son noticed that she goes to bed at 5 PM nightly and gets up at 10 AM.  In addition to these behaviors patient has been experiencing frequent falls not associated with tripping or dizziness.  Patient reports she typically stands up and falls backwards.  Son notices these falls occur with position changes.  As documented patient has poor oral intake.  Labs revealed leukocytosis 15,000 range in the ER.   Work-up unrevealing regards to possible infection.  Imaging reveals no traumatic injuries post fall.  Patient has elevated white count as well as hemoconcentration with normal BUN/creatinine.  This is concerning for dehydration.  Patient will be admitted as an inpatient for further evaluation and treatment for dehydration and psychiatry will be consulted to assist with medication management regarding her progressive behavioral symptoms in relation to her dementia.  Given her aggressive behaviors unclear at this point if she would be a candidate for inpatient Geropsychiatric treatment and drug titration.  Review of Systems: As mentioned in the history of present illness. All other systems reviewed and are negative. Past Medical History:  Diagnosis Date   Asthma    Breast cancer ([place]) 2010   RIGHT   Cancer ([place])    basel cell, s/p MOH'S/DR. DASHER   GERD (gastroesophageal reflux disease) 06/12/11   EGD NORMAL   Hx of mammogram 2014   NORMAL   Hyperlipidemia    Hypertension    Osteopenia    PAF (paroxysmal atrial fibrillation) ([place])    Rickettsial disease    [place] x 18 months   Past Surgical History:  Procedure Laterality Date   ABDOMINAL HYSTERECTOMY  1980   D/T FIBROIDS   BLADDER SUSPENSION  1983   BREAST BIOPSY Left    benign    BREAST SURGERY Right 2010   MASTECTOMY/CA   COLONOSCOPY  2013   NORMAL   MASTECTOMY Right 2010   MOLES REMOVED  12/05/10   FROM [facility]   ORIF WRIST FRACTURE Left 01/13/2020   Procedure: OPEN REDUCTION INTERNAL FIXATION (ORIF) WRIST FRACTURE;  Surgeon: Rudene Christians,  Legrand Como, MD;  Location: [facility] ORS;  Service: Orthopedics;  Laterality: Left;   Social History:  reports that she has never smoked. She has never used smokeless tobacco. She reports that she does not currently use drugs. She reports that she does not drink alcohol.  Allergies  Allergen Reactions   Flagyl [Metronidazole] Nausea Only   Pacerone [Amiodarone]     Eye deposits   Ramipril Cough     Family History  Problem Relation Age of Onset   Heart disease Mother    Stroke Mother    Cancer Father        LUNG   Diabetes Brother    Heart disease Brother    Hyperlipidemia Brother    Hypertension Brother    Diabetes Brother    Heart disease Brother    Hyperlipidemia Brother    Hypertension Brother    Breast cancer Neg Hx     Prior to Admission medications   Medication Sig Start Date End Date Taking? Authorizing Provider  amLODipine (NORVASC) 2.5 MG tablet Take 2.5 mg by mouth daily. 08/30/21 08/30/22 Yes [provider]  atorvastatin (LIPITOR) 10 MG tablet Take 10 mg by mouth at bedtime. 11/30/19  Yes [provider]  memantine (NAMENDA) 10 MG tablet Take 10 mg by mouth in the morning and at bedtime. 08/06/21  Yes [provider]  metoprolol tartrate (LOPRESSOR) 25 MG tablet Take 25 mg by mouth 2 (two) times daily.   Yes [provider]  mirtazapine (REMERON) 7.5 MG tablet Take 7.5 mg by mouth at bedtime. 09/24/21  Yes [provider]  warfarin (COUMADIN) 2 MG tablet Take 2 mg by mouth as directed. TAKE 1 TABLET BY MOUTH DAILY ON SUNDAY AND WEDNESDAY, AND 1/2 TABLET DAILY ALL OTHER DAYS   Yes [provider]  alendronate (FOSAMAX) 70 MG tablet Take 70 mg by mouth once a week. (Sundays) 10/27/19   [provider]    Physical Exam: Vitals:   10/18/21 0242 10/18/21 0242 10/18/21 0434 10/18/21 0434  BP:  (!) 165/97  (!) 166/90  Pulse:  78  74  Resp:  18  18  Temp: 98.1 F (36.7 C)  98.2 F (36.8 C)   TempSrc: Oral  Oral   SpO2:  97%  99%  Weight:      Height:       Constitutional: NAD, calm, comfortable Eyes: PERRL, lids and conjunctivae normal ENMT: Mucous membranes are dry. Posterior pharynx clear of any exudate or lesions.  Age-appropriate dentition. Neck: normal, supple, no masses, no thyromegaly Respiratory: clear to auscultation bilaterally, no wheezing, no crackles. Normal respiratory effort. No  accessory muscle use.  Cardiovascular: Irregular rate with underlying rhythm atrial fibrillation, no murmurs / rubs / gallops. No extremity edema. 2+ pedal pulses. No carotid bruits.  Abdomen: no tenderness, no masses palpated. No hepatosplenomegaly. Bowel sounds positive.  Musculoskeletal: no clubbing / cyanosis. No joint deformity upper and lower extremities. Good ROM, no contractures. Normal muscle tone.  Skin: no rashes, lesions, ulcers. No induration-nailbeds are bluish discolored and hands are very cool to the touch Neurologic: CN 2-12 grossly intact. Sensation abnormal noting patient has decreased sensation mid tibial down to feet bilaterally.  Plantar sensation also diminished DTR normal. Strength 4-5/5 x all 4 extremities.  Psychiatric: Patient participatory in exam and is able to identify that she does have memory issues and behavioral issues.  She attributes some of the conflict between herself and her family as being secondary to them "not understanding".  She does not have insight into how her behavior issues are negatively impacting her family.  Alert and oriented x 3.  Pleasant mood.  Reports depression.  Data Reviewed:  Laboratory data Sodium 137, potassium 4.2, chloride 104, CO2 24, BUN 23, creatinine 0.7, glucose 100, LFTs are normal, GFR greater than 60, serial troponins 9 and 8, lactic acid 1.9 and 1.8, white count 15,400, hemoglobin 16, platelets 135,000, PT 23.1, INR 2.1, respiratory panel negative for influenza and COVID, urinalysis not consistent with UTI but does appear consistent with dehydration noting straw-colored urine with moderate hemoglobin and a decreased specific gravity 1.009 (consistent with increased ADH secretion)  Diagnostic data: CT cervical spine, x-ray lumbar spine, pelvis, left and right femur, left and right elbow doubt any acute traumatic injury; portable chest x-ray without any evidence of acute infection or other cardiopulmonary disease   Assessment  and Plan: Dehydration Multifactorial and likely secondary to patient's underlying dementia or oral intake Will encourage oral fluid intake and also initiate low rate normal saline IV repletion at 50 cc/h Hold any offending meds Repeat labs in a.m.  Recurrent falls Given multifactorial etiology: Decreased sensation in lower extremities and plantar surfaces of feet with possible impacts by either orthostatic hypotension or arrhythmia During monitoring for 24-hour PT and OT evaluation Patient does not use RW at home as prescribed  Progressive dementia with recurrent behavioral issues related to paranoia and aggression We will hold preadmission Remeron and Namenda since these do not appear to be helping Geropsych consult for medication management and for possible consideration for short-term admission to geropsych unit for medication titration While in ER behaviors are stable without any aggression Patient also has underlying depression and was not on medications prior to admission-defer initiation of medications to the psychiatric team Check TSH   Chronic shoulder pain in context of rotator cuff tear and glenohumeral arthritis Patient reports Tylenol ineffective Begin lidocaine patch Begin very low-dose Oxy IR 2.5 mg every 8 hours as needed-monitor for oversedation or psychiatric symptoms related to this medication Patient denies pain is keeping her from sleeping thus do not suspect shoulder pain contributing to patient's psychiatric symptoms  Hypertension Continue preadmission Norvasc  PAF Coumadin per pharmacy Continue beta-blocker QTc less than 400-EKG otherwise with atrial fibrillation controlled ventricular response of 73 bpm  Dyslipidemia Continue Lipitor   Advance Care Planning:   Code Status: Prior DNR  VTE prophylaxis: On full dose warfarin for underlying PAF  Consults: Psychiatry  Family Communication: Son at bedside  Severity of Illness: The appropriate patient  status for this patient is INPATIENT. Inpatient status is judged to be reasonable and necessary in order to provide the required intensity of service to ensure the patient's safety. The patient's presenting symptoms, physical exam findings, and initial radiographic and laboratory data in the context of their chronic comorbidities is felt to place them at high risk for further clinical deterioration. Furthermore, it is not anticipated that the patient will be medically stable for discharge from the hospital within 2 midnights of admission.   * I certify that at the point of admission it is my clinical judgment that the patient will require inpatient hospital care spanning beyond 2 midnights from the point of admission due to high intensity of service, high risk for further deterioration and high frequency of surveillance required.*  Author: Erin Hearing, NP 10/18/2021 7:03 AM  For on call review [email or web address].

## 2021-10-18 NOTE — ED Provider Notes (Signed)
[facility] Provider Note    Event Date/Time   First MD Initiated Contact with Patient 10/18/21 0123     (approximate)   History   Altered Mental Status and Fall   HPI  Laura David is a 87 y.o. female who presents to the ED for evaluation of Altered Mental Status and Fall   I review a medical DC summary from 10/6.  History of paroxysmal A-fib on Coumadin, HTN, breast cancer and cognitive impairments.  Was admitted for confusion and a fall.  Son brings the patient back to the ED for evaluation of more falls and confusion.  Reports difficulty caring for the patient at home.  He questions another UTI.  Physical Exam   Triage Vital Signs: ED Triage Vitals  Enc Vitals Group     BP 10/17/21 2254 (!) 166/92     Pulse Rate 10/17/21 2254 75     Resp 10/17/21 2254 16     Temp 10/17/21 2254 98.3 F (36.8 C)     Temp Source 10/17/21 2254 Oral     SpO2 10/17/21 2254 100 %     Weight 10/17/21 2255 106 lb (48.1 kg)     Height 10/17/21 2255 '5\' 1"'$  (1.549 m)     Head Circumference --      Peak Flow --      Pain Score 10/17/21 2320 2     Pain Loc --      Pain Edu? --      Excl. in [place]? --     Most recent vital signs: Vitals:   10/17/21 2254  BP: (!) 166/92  Pulse: 75  Resp: 16  Temp: 98.3 F (36.8 C)  SpO2: 100%    General: Awake, no distress.  Pleasantly disoriented CV:  Good peripheral perfusion.  Resp:  Normal effort.  Abd:  No distention.  MSK:  Some small superficial abrasions to the bilateral elbows without evidence of laceration, swelling or open injury.  No signs of trauma to the back. Neuro:  No focal deficits appreciated. Other:     ED Results / Procedures / Treatments   Labs (all labs ordered are listed, but only abnormal results are displayed) Labs Reviewed  COMPREHENSIVE METABOLIC PANEL - Abnormal; Notable for the following components:      Result Value   Glucose, Bld 100 (*)    Total Protein 8.2 (*)    All other  components within normal limits  CBC WITH DIFFERENTIAL/PLATELET - Abnormal; Notable for the following components:   WBC 15.4 (*)    RBC 5.19 (*)    Hemoglobin 16.0 (*)    HCT 48.2 (*)    Neutro Abs 12.1 (*)    Monocytes Absolute 1.3 (*)    Abs Immature Granulocytes 0.16 (*)    All other components within normal limits  URINALYSIS, ROUTINE W REFLEX MICROSCOPIC - Abnormal; Notable for the following components:   Color, Urine STRAW (*)    APPearance CLEAR (*)    Hgb urine dipstick MODERATE (*)    All other components within normal limits  PROTIME-INR - Abnormal; Notable for the following components:   Prothrombin Time 23.1 (*)    INR 2.1 (*)    All other components within normal limits  URINE CULTURE  RESP PANEL BY RT-PCR (FLU A&B, COVID) [id number]  LACTIC ACID, PLASMA  AMMONIA  LACTIC ACID, PLASMA  TROPONIN I (HIGH SENSITIVITY)  TROPONIN I (HIGH SENSITIVITY)    EKG A-fib with a rate of  73 bpm.  Rightward axis.  Appropriate intervals and no STEMI.  RADIOLOGY CT head interpreted by me without evidence of acute intracranial pathology CT C-spine interpreted by me without evidence of acute fracture or dislocation.   Official radiology report(s): DG Elbow Complete Left  Result Date: 10/18/2021 CLINICAL DATA:  Pain after a fall EXAM: LEFT ELBOW - COMPLETE 3+ VIEW COMPARISON:  01/09/2020 FINDINGS: There is no evidence of fracture, dislocation, or joint effusion. There is no evidence of arthropathy or other focal bone abnormality. Soft tissues are unremarkable. IMPRESSION: Negative. Electronically Signed   By: Lucienne Capers M.D.   On: 10/18/2021 00:30   DG Elbow Complete Right  Result Date: 10/18/2021 CLINICAL DATA:  Pain after a fall EXAM: RIGHT ELBOW - COMPLETE 3+ VIEW COMPARISON:  None Available. FINDINGS: There is no evidence of fracture, dislocation, or joint effusion. There is no evidence of arthropathy or other focal bone abnormality. Soft tissues are unremarkable.  IMPRESSION: Negative. Electronically Signed   By: Lucienne Capers M.D.   On: 10/18/2021 00:29   DG Femur Min 2 Views Right  Result Date: 10/18/2021 CLINICAL DATA:  Pain after a fall EXAM: RIGHT FEMUR 2 VIEWS COMPARISON:  None Available. FINDINGS: Mild degenerative changes in the right knee and right hip. No evidence of acute fracture or dislocation of the femur. No focal bone lesion or bone destruction. Bone cortex appears intact. Vascular calcifications. IMPRESSION: Mild degenerative changes.  No acute bony abnormalities. Electronically Signed   By: Lucienne Capers M.D.   On: 10/18/2021 00:29   DG Femur Min 2 Views Left  Result Date: 10/18/2021 CLINICAL DATA:  Pain after a fall EXAM: LEFT FEMUR 2 VIEWS COMPARISON:  None Available. FINDINGS: Degenerative changes in the left hip and left knee joints. No evidence of acute fracture or dislocation of the femur. No focal bone lesion or bone destruction. Prominent vascular calcifications. IMPRESSION: Degenerative changes.  No acute bony abnormalities. Electronically Signed   By: Lucienne Capers M.D.   On: 10/18/2021 00:28   DG Lumbar Spine 2-3 Views  Result Date: 10/18/2021 CLINICAL DATA:  Pain after a fall EXAM: LUMBAR SPINE - 2-3 VIEW COMPARISON:  None Available. FINDINGS: Five lumbar type vertebral bodies. Normal alignment. No vertebral compression deformities. Degenerative changes throughout with narrowed interspaces and endplate osteophyte formation. Degenerative changes in the lower lumbar facet joints. No focal bone lesions. Visualized sacrum appears intact. Vascular calcifications. IMPRESSION: Degenerative changes in the lumbar spine. Normal alignment. No acute displaced fractures identified. Electronically Signed   By: Lucienne Capers M.D.   On: 10/18/2021 00:28   DG Pelvis 1-2 Views  Result Date: 10/18/2021 CLINICAL DATA:  Pain after a fall. EXAM: PELVIS - 1-2 VIEW COMPARISON:  Pelvis 01/09/2020.  Sacrum none 08/25/2021 FINDINGS:  Degenerative changes in the lower lumbar spine and hips. No acute fractures identified. SI joints and symphysis pubis are not displaced. No focal bone lesions. IMPRESSION: No acute bony abnormalities.  Mild degenerative changes. Electronically Signed   By: Lucienne Capers M.D.   On: 10/18/2021 00:26   CT Cervical Spine Wo Contrast  Result Date: 10/18/2021 CLINICAL DATA:  Poly trauma blunt. Fall. Increasing confusion and combative with fevers. EXAM: CT CERVICAL SPINE WITHOUT CONTRAST TECHNIQUE: Multidetector CT imaging of the cervical spine was performed without intravenous contrast. Multiplanar CT image reconstructions were also generated. RADIATION DOSE REDUCTION: This exam was performed according to the departmental dose-optimization program which includes automated exposure control, adjustment of the mA and/or kV according to patient size and/or  use of iterative reconstruction technique. COMPARISON:  01/09/2020 FINDINGS: Alignment: No change in alignment since prior study with straightening of usual cervical lordosis and slight anterior subluxation at C4-5. Normal alignment of the facet joints. C1-2 articulation appears intact. Skull base and vertebrae: No acute fracture. No primary bone lesion or focal pathologic process. Soft tissues and spinal canal: No prevertebral fluid or swelling. No visible canal hematoma. Disc levels: Degenerative changes with disc space narrowing and osteophyte formation most prominent at C5-6. No change. Upper chest: Mild apical scarring in the lungs. Other: None. IMPRESSION: Alignment of the cervical spine is unchanged since prior study. Degenerative changes at C5-6. No acute displaced fractures are identified. Electronically Signed   By: Lucienne Capers M.D.   On: 10/18/2021 00:01   CT Head Wo Contrast  Result Date: 10/17/2021 CLINICAL DATA:  Poly trauma due to a fall. Increasingly confused and combative. Fevers. EXAM: CT HEAD WITHOUT CONTRAST TECHNIQUE: Contiguous axial  images were obtained from the base of the skull through the vertex without intravenous contrast. RADIATION DOSE REDUCTION: This exam was performed according to the departmental dose-optimization program which includes automated exposure control, adjustment of the mA and/or kV according to patient size and/or use of iterative reconstruction technique. COMPARISON:  10/03/2021 FINDINGS: Brain: Diffuse cerebral atrophy. Ventricular dilatation consistent with central atrophy. Low-attenuation changes in the deep white matter consistent with small vessel ischemia. No abnormal extra-axial fluid collections. No mass effect or midline shift. Gray-white matter junctions are distinct. Basal cisterns are not effaced. No acute intracranial hemorrhage. Vascular: Intracranial arterial calcifications. Skull: Calvarium appears intact. Sinuses/Orbits: Paranasal sinuses and mastoid air cells are clear. Other: None. IMPRESSION: No acute intracranial abnormalities. Chronic atrophy and small vessel ischemic changes. Electronically Signed   By: Lucienne Capers M.D.   On: 10/17/2021 23:56    PROCEDURES and INTERVENTIONS:  Procedures  Medications - No data to display   IMPRESSION / MDM / [place] / ED COURSE  I reviewed the triage vital signs and the nursing notes.  Differential diagnosis includes, but is not limited to, fracture, dislocation, ICH, sepsis  {Patient presents with symptoms of an acute illness or injury that is potentially life-threatening.  87 year old female presents after more falls.  No signs of UTI on urine today.  Leukocytosis is noted, but no clear signs of underlying infection.  Normal metabolic panel, troponin and viral testing panel.  Imaging without clear signs of fracture.  Due to her worsening recurrent falls we will consult with medicine for admission, PT and possible placement.      FINAL CLINICAL IMPRESSION(S) / ED DIAGNOSES   Final diagnoses:  None     Rx / DC Orders    ED Discharge Orders     None        Note:  This document was prepared using Dragon voice recognition software and may include unintentional dictation errors.   Vladimir Crofts, MD 10/18/21 [phone]

## 2021-10-19 DIAGNOSIS — E860 Dehydration: Secondary | ICD-10-CM | POA: Diagnosis not present

## 2021-10-19 LAB — BASIC METABOLIC PANEL
Anion gap: 9 (ref 5–15)
BUN: 22 mg/dL (ref 8–23)
CO2: 27 mmol/L (ref 22–32)
Calcium: 9.7 mg/dL (ref 8.9–10.3)
Chloride: 101 mmol/L (ref 98–111)
Creatinine, Ser: 0.77 mg/dL (ref 0.44–1.00)
GFR, Estimated: >60 mL/min (ref >60)
Glucose, Bld: 104 mg/dL — ABNORMAL HIGH (ref 70–99)
Potassium: 3.7 mmol/L (ref 3.5–5.1)
Sodium: 137 mmol/L (ref 135–145)

## 2021-10-19 LAB — CBC
HCT: 47.9 % — ABNORMAL HIGH (ref 36.0–46.0)
Hemoglobin: 16.2 g/dL — ABNORMAL HIGH (ref 12.0–15.0)
MCH: 31 pg (ref 26.0–34.0)
MCHC: 33.8 g/dL (ref 30.0–36.0)
MCV: 91.6 fL (ref 80.0–100.0)
Platelets: 285 10*3/uL (ref 150–400)
RBC: 5.23 MIL/uL — ABNORMAL HIGH (ref 3.87–5.11)
RDW: 13.2 % (ref 11.5–15.5)
WBC: 18.2 10*3/uL — ABNORMAL HIGH (ref 4.0–10.5)
nRBC: 0 % (ref 0.0–0.2)

## 2021-10-19 LAB — URINE CULTURE: Culture: 10000 — AB

## 2021-10-19 LAB — PROCALCITONIN: Procalcitonin: <0.1 ng/mL

## 2021-10-19 LAB — PROTIME-INR
INR: 1.8 — ABNORMAL HIGH (ref 0.8–1.2)
Prothrombin Time: 20.4 seconds — ABNORMAL HIGH (ref 11.4–15.2)

## 2021-10-19 MED ORDER — WARFARIN SODIUM 1 MG PO TABS
1.0000 mg | ORAL_TABLET | ORAL | Status: DC
  Filled 2021-10-19: qty 1

## 2021-10-19 MED ORDER — WARFARIN SODIUM 2 MG PO TABS
2.0000 mg | ORAL_TABLET | Freq: Once | ORAL | Status: AC
  Administered 2021-10-19: 2 mg via ORAL
  Filled 2021-10-19 (×2): qty 1

## 2021-10-19 MED ORDER — WARFARIN SODIUM 2 MG PO TABS
2.0000 mg | ORAL_TABLET | ORAL | Status: DC
  Filled 2021-10-19: qty 1

## 2021-10-19 NOTE — ED Notes (Signed)
Pt ambulated to bathroom with 1 assist at this time.

## 2021-10-19 NOTE — Progress Notes (Signed)
  PROGRESS NOTE    NONNA RENNINGER  ZOX:[id number] DOB: 09-18-1934 DOA: 10/18/2021 PCP: [name], MD  [id number]  LOS: 1 day   Brief hospital course:   Assessment & Plan: Laura David is a 87 y.o. female with medical history significant of hypertension, PAF on warfarin, history of breast cancer, history of prior subdural hematoma, COPD/asthma, recurrent falls, known dementia, right shoulder pain secondary to rotator cuff tear and glenohumeral arthritis.  She was brought to the ER by her family due to behavioral changes, confusion worse than baseline as well as aggressive, paranoia and combative behaviors at home, and excessive sleep.  She has also had worsening weakness and recurrent falls.  Was recently hospitalized and discharged on 10/6 after being treated for UTI as well as right shoulder pain.    Recurrent falls --2/2 worsening weakness and not using her wheelchair. --PT/OT rec SNF rehab   Progressive dementia with recurrent behavioral issues related to paranoia and aggression --mental status much improved the day after presentation.      Chronic shoulder pain in context of rotator cuff tear and glenohumeral arthritis Patient reports Tylenol ineffective --cont lidocaine patch (new) --cont Oxy IR 2.5 mg every 8 hours PRN (new)   Hypertension --cont amlodipine and Lopressor   PAF Coumadin per pharmacy Continue beta-blocker   Dyslipidemia Continue Lipitor   DVT prophylaxis: [id number] Code Status: DNR  Family Communication: son updated at bedside today Level of care: Telemetry Cardiac Dispo:   The patient is from: home Anticipated d/c is to: SNF rehab Anticipated d/c date is: whenever bed available   Subjective and Interval History:  Son at bedside.  Pt was alert, oriented, calm and coherent this morning.  Reported pain in her right shoulder and left arm.  Per son, appetite has been good and eating well.   Objective: Vitals:   10/18/21 2345  10/19/21 0809 10/19/21 1305 10/19/21 1831  BP: (!) 145/87 (!) 145/86 113/61   Pulse: 81 76 66   Resp: '20 19 17   '$ Temp: 98 F (36.7 C) 98 F (36.7 C)  98.2 F (36.8 C)  TempSrc:  Oral  Oral  SpO2: 98% 98% 99%   Weight:      Height:       No intake or output data in the 24 hours ending 10/19/21 1832 Filed Weights   10/17/21 2255  Weight: 48.1 kg    Examination:   Constitutional: NAD, AAOx3 HEENT: conjunctivae and lids normal, EOMI CV: No cyanosis.   RESP: normal respiratory effort, on RA Neuro: II - XII grossly intact.   Psych: Normal mood and affect.     Data Reviewed: I have personally reviewed labs and imaging studies  Time spent: 35 minutes  Enzo Bi, MD [facility] If 7PM-7AM, please contact night-coverage 10/19/2021, 6:32 PM

## 2021-10-19 NOTE — Consult Note (Signed)
ANTICOAGULATION CONSULT NOTE - Initial Consult  Pharmacy Consult for warfarin Indication: atrial fibrillation  Allergies  Allergen Reactions   Flagyl [Metronidazole] Nausea Only   Pacerone [Amiodarone]     Eye deposits   Ramipril Cough    Patient Measurements: Height: '5\' 1"'$  (154.9 cm) Weight: 48.1 kg (106 lb) IBW/kg (Calculated) : 47.8  Vital Signs: Temp: 98 F (36.7 C) (10/14 0809) Temp Source: Oral (10/14 0809) BP: 145/86 (10/14 0809) Pulse Rate: 76 (10/14 0809)  Labs: Recent Labs    10/17/21 2342 10/18/21 0122  HGB 16.0*  --   HCT 48.2*  --   PLT 335  --   LABPROT 23.1*  --   INR 2.1*  --   CREATININE 0.73  --   TROPONINIHS 9 8     Estimated Creatinine Clearance: 38.8 mL/min (by C-G formula based on SCr of 0.73 mg/dL).   Medical History: Past Medical History:  Diagnosis Date   Asthma    Breast cancer ([place]) 2010   RIGHT   Cancer ([place])    basel cell, s/p MOH'S/DR. 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 Regarding paroxysmal Afib, patient's CHADSVASc is 5, HR stable and WNL, and INR is currently at goal 2-3.  Drug-drug interactions: None significant  Date INR Warfarin Dose Comment 10/13 2.1 '1mg'$  10/14 1.8 '2mg'$    INR slightly subtherapeutic; increase dose x1  Goal of Therapy:  INR 2-3   Plan:  INR 2.1>1.8 (delta 0.3) despite usual home dose of '1mg'$ . Will give warfarin 2 mg x1 today;  then continue home dosing pending daily INR  assessment. Adjust as necessary if INR not at goal Monitor INR daily  Lorna Dibble, PharmD, [facility] Clinical Pharmacist 10/19/2021 12:03 PM

## 2021-10-19 NOTE — ED Notes (Signed)
Pt resting comfortably at this time. Will obtain vitals when awake.

## 2021-10-20 ENCOUNTER — Other Ambulatory Visit: Payer: Self-pay

## 2021-10-20 DIAGNOSIS — E860 Dehydration: Secondary | ICD-10-CM | POA: Diagnosis not present

## 2021-10-20 LAB — BASIC METABOLIC PANEL
Anion gap: 8 (ref 5–15)
BUN: 17 mg/dL (ref 8–23)
CO2: 24 mmol/L (ref 22–32)
Calcium: 9.1 mg/dL (ref 8.9–10.3)
Chloride: 103 mmol/L (ref 98–111)
Creatinine, Ser: 0.72 mg/dL (ref 0.44–1.00)
GFR, Estimated: >60 mL/min (ref >60)
Glucose, Bld: 89 mg/dL (ref 70–99)
Potassium: 3.8 mmol/L (ref 3.5–5.1)
Sodium: 135 mmol/L (ref 135–145)

## 2021-10-20 LAB — PROTIME-INR
INR: 2.3 — ABNORMAL HIGH (ref 0.8–1.2)
Prothrombin Time: 25.4 seconds — ABNORMAL HIGH (ref 11.4–15.2)

## 2021-10-20 LAB — MAGNESIUM: Magnesium: 1.9 mg/dL (ref 1.7–2.4)

## 2021-10-20 MED ORDER — WARFARIN SODIUM 1 MG PO TABS
1.0000 mg | ORAL_TABLET | ORAL | Status: DC
  Administered 2021-10-21 – 2021-10-26 (×5): 1 mg via ORAL
  Filled 2021-10-20 (×5): qty 1

## 2021-10-20 MED ORDER — WARFARIN SODIUM 2 MG PO TABS
2.0000 mg | ORAL_TABLET | ORAL | Status: DC
  Administered 2021-10-23: 2 mg via ORAL
  Filled 2021-10-20: qty 1

## 2021-10-20 MED ORDER — WARFARIN SODIUM 1 MG PO TABS
1.0000 mg | ORAL_TABLET | Freq: Once | ORAL | Status: AC
  Administered 2021-10-20: 1 mg via ORAL
  Filled 2021-10-20: qty 1

## 2021-10-20 NOTE — Progress Notes (Signed)
  PROGRESS NOTE    Laura David  LSL:[id number] DOB: 03-03-1934 DOA: 10/18/2021 PCP: [name], MD  [id number]  LOS: 2 days   Brief hospital course:   Assessment & Plan: Laura David is a 87 y.o. female with medical history significant of hypertension, PAF on warfarin, history of breast cancer, history of prior subdural hematoma, COPD/asthma, recurrent falls, known dementia, right shoulder pain secondary to rotator cuff tear and glenohumeral arthritis.  She was brought to the ER by her family due to behavioral changes, confusion worse than baseline as well as aggressive, paranoia and combative behaviors at home, and excessive sleep.  She has also had worsening weakness and recurrent falls.  Was recently hospitalized and discharged on 10/6 after being treated for UTI as well as right shoulder pain.    Recurrent falls --2/2 worsening weakness and not using her wheelchair. --PT/OT rec SNF rehab   Progressive dementia with recurrent behavioral issues related to paranoia and aggression --mental status much improved the day after presentation.      Chronic shoulder pain in context of rotator cuff tear and glenohumeral arthritis Patient reports Tylenol ineffective --cont lidocaine patch (new) --cont Oxy IR 2.5 mg every 8 hours PRN (new)   Hypertension --cont amlodipine and Lopressor   PAF Coumadin per pharmacy Continue beta-blocker   Dyslipidemia Continue Lipitor   DVT prophylaxis: TL:XBWIOMBT Code Status: DNR  Family Communication: son updated at bedside today Level of care: Med-Surg Dispo:   The patient is from: home Anticipated d/c is to: SNF rehab Anticipated d/c date is: whenever bed available   Subjective and Interval History:  No acute events.   Objective: Vitals:   10/20/21 0004 10/20/21 0423 10/20/21 0808 10/20/21 1157  BP: (!) 160/83 (!) 153/78 (!) 155/95 126/63  Pulse: 67 60 67 63  Resp: '18 16 17 17  '$ Temp: 97.8 F (36.6 C) 98 F (36.7 C)  97.8 F (36.6 C) (!) 97.5 F (36.4 C)  TempSrc: Oral Oral    SpO2: 96% 95% 97% 99%  Weight:      Height:        Intake/Output Summary (Last 24 hours) at 10/20/2021 1455 Last data filed at 10/20/2021 1157 Gross per 24 hour  Intake 243 ml  Output 800 ml  Net -557 ml   Filed Weights   10/17/21 2255 10/19/21 2059  Weight: 48.1 kg 45.8 kg    Examination:   Constitutional: NAD, AAOx3 HEENT: conjunctivae and lids normal, EOMI CV: No cyanosis.   RESP: normal respiratory effort, on RA Neuro: II - XII grossly intact.   Psych: Normal mood and affect.     Data Reviewed: I have personally reviewed labs and imaging studies  Time spent: 25 minutes  Enzo Bi, MD [facility] If 7PM-7AM, please contact night-coverage 10/20/2021, 2:55 PM

## 2021-10-20 NOTE — NC FL2 (Signed)
[place] LEVEL OF CARE SCREENING TOOL     IDENTIFICATION  Patient Name: Laura David Birthdate: 09/25/1934 Sex: female Admission Date (Current Location): 10/18/2021  [facility] and [place] Number:  Engineering geologist and Address:  [facility], [address] [place], [place] [address]      Provider Number: [id number]  Attending Physician Name and Address:  Enzo Bi, MD  Relative Name and Phone Number:  Leilani, Cespedes (Son)   [phone] [facility]) (POA)    Current Level of Care: Hospital Recommended Level of Care: [place] Prior Approval Number: [id number]  Date Approved/Denied: 01/13/20 PASRR Number: [id number]  Discharge Plan: SNF    Current Diagnoses: Patient Active Problem List   Diagnosis Date Noted   Recurrent falls 10/18/2021   Dehydration, moderate 10/18/2021   Agitation 10/18/2021   Right shoulder pain 10/07/2021   Anticoagulated on Coumadin 10/04/2021   Senile dementia with delirium with behavioral disturbance ([place]) 10/04/2021   E. coli UTI 10/04/2021   Dementia ([place]) 10/04/2021   Closed fracture of left wrist    Coagulopathy ([place]) 01/09/2020   Subdural hematoma ([place]) 01/09/2020   Leucocytosis 01/09/2020   Fall at home, initial encounter 01/09/2020   Hypokalemia 01/09/2020   Closed comminuted left humeral fracture 01/09/2020   Mitral regurgitation 05/10/2013   Tricuspid regurgitation 05/10/2013   Hypertension    Hyperlipidemia    PAF (paroxysmal atrial fibrillation) (HCC)    Osteopenia    Cancer (HCC)    Asthma    GERD (gastroesophageal reflux disease)    Rickettsial disease    Breast cancer ([place])     Orientation RESPIRATION BLADDER Height & Weight     Self, Time, Place (Fluctuating due to medical condition/htx of confusion)  Normal   Weight: 45.8 kg Height:  5' (152.4 cm)  BEHAVIORAL SYMPTOMS/MOOD NEUROLOGICAL BOWEL NUTRITION STATUS        Diet  AMBULATORY STATUS COMMUNICATION OF  NEEDS Skin   Extensive Assist (History of falls)   Other (Comment) (Scattered Ecchymosis)                       Personal Care Assistance Level of Assistance  Bathing, Dressing, Feeding Bathing Assistance: Maximum assistance Feeding assistance: Limited assistance Dressing Assistance: Maximum assistance     Functional Limitations Info  Sight Sight Info: Impaired (corrective glasses)        SPECIAL CARE FACTORS FREQUENCY  PT (By licensed PT), OT (By licensed OT)     PT Frequency: 5x/week OT Frequency: 5x/week            Contractures Contractures Info: Not present    Additional Factors Info  Code Status, Allergies Code Status Info: DNR Allergies Info: Flagyl (Metronidazole), Pacerone (Amiodarone), Ramipril           Current Medications (10/20/2021):  This is the current hospital active medication list Current Facility-Administered Medications  Medication Dose Route Frequency Provider Last Rate Last Admin   0.9 %  sodium chloride infusion  250 mL Intravenous PRN Samella Parr, NP       acetaminophen (TYLENOL) tablet 650 mg  650 mg Oral Q6H PRN Samella Parr, NP       Or   acetaminophen (TYLENOL) suppository 650 mg  650 mg Rectal Q6H PRN Samella Parr, NP       amLODipine (NORVASC) tablet 2.5 mg  2.5 mg Oral Daily Samella Parr, NP   2.5 mg at 10/20/21 [phone]  atorvastatin (LIPITOR) tablet 10 mg  10 mg Oral Daily Samella Parr, NP   10 mg at 10/20/21 0847   divalproex (DEPAKOTE) DR tablet 125 mg  125 mg Oral Q12H Patrecia Pour, NP   125 mg at 10/20/21 0848   lidocaine (LIDODERM) 5 % 1 patch  1 patch Transdermal Q24H Samella Parr, NP   1 patch at 10/20/21 0847   memantine (NAMENDA) tablet 10 mg  10 mg Oral Daily Lorella Nimrod, MD   10 mg at 10/20/21 0847   metoprolol tartrate (LOPRESSOR) tablet 25 mg  25 mg Oral BID Samella Parr, NP   25 mg at 10/20/21 0847   mirtazapine (REMERON) tablet 7.5 mg  7.5 mg Oral QHS Lorella Nimrod, MD   7.5 mg at  10/19/21 2113   ondansetron (ZOFRAN) tablet 4 mg  4 mg Oral Q6H PRN Samella Parr, NP       Or   ondansetron [facility]) injection 4 mg  4 mg Intravenous Q6H PRN Samella Parr, NP       oxyCODONE (Oxy IR/ROXICODONE) immediate release tablet 2.5 mg  2.5 mg Oral Q8H PRN Samella Parr, NP       risperiDONE (RISPERDAL) tablet 0.25 mg  0.25 mg Oral BID PRN Patrecia Pour, NP   0.25 mg at 10/18/21 2124   sodium chloride flush (NS) 0.9 % injection 3 mL  3 mL Intravenous Q12H Samella Parr, NP   3 mL at 10/20/21 0848   sodium chloride flush (NS) 0.9 % injection 3 mL  3 mL Intravenous PRN Samella Parr, NP       warfarin (COUMADIN) tablet 1 mg  1 mg Oral ONCE-1600 Noralee Space, RPH       [START ON 10/23/2021] warfarin (COUMADIN) tablet 2 mg  2 mg Oral Once per day on Sun Wed Merrill, Kristin A, [facility]       And   [START ON 10/21/2021] warfarin (COUMADIN) tablet 1 mg  1 mg Oral Once per day on Mon Tue Thu Fri Sat Noralee Space, [facility]       Warfarin - Pharmacist Dosing Inpatient   Does not apply [id number] Delena Bali, [facility]   Given at 10/18/21 1536     Discharge Medications: Please see discharge summary for a list of discharge medications.  Relevant Imaging Results:  Relevant Lab Results:   Additional Information ss [id number]  Izola Price, RN

## 2021-10-20 NOTE — Consult Note (Signed)
ANTICOAGULATION CONSULT NOTE -   Pharmacy Consult for warfarin Indication: atrial fibrillation  Allergies  Allergen Reactions   Flagyl [Metronidazole] Nausea Only   Pacerone [Amiodarone]     Eye deposits   Ramipril Cough    Patient Measurements: Height: 5' (152.4 cm) Weight: 45.8 kg (100 lb 15.5 oz) IBW/kg (Calculated) : 45.5  Vital Signs: Temp: 97.5 F (36.4 C) (10/15 1157) Temp Source: Oral (10/15 0423) BP: 126/63 (10/15 1157) Pulse Rate: 63 (10/15 1157)  Labs: Recent Labs    10/17/21 2342 10/18/21 0122 10/19/21 0714 10/19/21 2214 10/20/21 0723 10/20/21 1150  HGB 16.0*  --   --  16.2*  --   --   HCT 48.2*  --   --  47.9*  --   --   PLT 335  --   --  285  --   --   LABPROT 23.1*  --  20.4*  --   --  25.4*  INR 2.1*  --  1.8*  --   --  2.3*  CREATININE 0.73  --  0.77  --  0.72  --   TROPONINIHS 9 8  --   --   --   --      Estimated Creatinine Clearance: 36.9 mL/min (by C-G formula based on SCr of 0.72 mg/dL).   Medical History: Past Medical History:  Diagnosis Date   Asthma    Breast cancer ([place]) 2010   RIGHT   Cancer ([place])    basel cell, s/p MOH'S/DR. 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 Regarding paroxysmal Afib, patient's CHADSVASc is 5, HR stable and WNL, and INR is currently at goal 2-3.  Drug-drug interactions: None significant  Date INR Warfarin Dose Comment 10/13 2.1 '1mg'$  10/14 1.8 '2mg'$    INR slightly subtherapeutic; increase dose  x1 10/15 2.3  Goal of Therapy:  INR 2-3   Plan:  INR 2.3 Will give warfarin 1 mg x1 today then resume home dose schedule Adjust as necessary if INR not at goal Monitor INR daily  Chinita Greenland PharmD Clinical Pharmacist 10/20/2021

## 2021-10-20 NOTE — TOC Progression Note (Addendum)
Transition of Care [facility]) - Progression Note    Patient Details  Name: Laura David MRN: [id number] Date of Birth: 1934/10/22  Transition of Care [facility]) CM/SW Contact  Izola Price, RN Phone Number: 10/20/2021, 2:05 PM  Clinical Narrative:   10/15: Prior PASRR #[id number] A 01/13/2020. Spoke with sonLennette Bihari [facility]), [phone] re SNF choices/preference. 1. [facility] 2/ [place]. 3. [facility]. Wants to stay as local as possible to him. Patient was a [facility] in 2022 with an "Okay" experience but would like to try others first. Explained process and that those preferences would be documented. Anything outside of [place] area would need to be discussed with son. Explained broader bed search and that TOC will follow. Simmie Davies RN CM 208 pm.   UPDATE: FL2 completed and CSW referral uploaded to [place].  [facility], [facility], [facility] are 1, 2, 3 preferences. Simmie Davies RN CM     Barriers to Discharge: Continued Medical Work up  Expected Discharge Plan and Services                                                 Social Determinants of Health (SDOH) Interventions Food Insecurity Interventions: Intervention Not Indicated Housing Interventions: Intervention Not Indicated Transportation Interventions: Intervention Not Indicated Utilities Interventions: Intervention Not Indicated  Readmission Risk Interventions    01/13/2020    4:33 PM  Readmission Risk Prevention Plan  Transportation Screening Complete  Medication Review (RN CM) Complete

## 2021-10-21 DIAGNOSIS — E860 Dehydration: Secondary | ICD-10-CM | POA: Diagnosis not present

## 2021-10-21 LAB — CBC
HCT: 46.8 % — ABNORMAL HIGH (ref 36.0–46.0)
Hemoglobin: 15.5 g/dL — ABNORMAL HIGH (ref 12.0–15.0)
MCH: 30.5 pg (ref 26.0–34.0)
MCHC: 33.1 g/dL (ref 30.0–36.0)
MCV: 92.1 fL (ref 80.0–100.0)
Platelets: 262 10*3/uL (ref 150–400)
RBC: 5.08 MIL/uL (ref 3.87–5.11)
RDW: 13.3 % (ref 11.5–15.5)
WBC: 14.7 10*3/uL — ABNORMAL HIGH (ref 4.0–10.5)
nRBC: 0 % (ref 0.0–0.2)

## 2021-10-21 LAB — BASIC METABOLIC PANEL
Anion gap: 9 (ref 5–15)
BUN: 23 mg/dL (ref 8–23)
CO2: 26 mmol/L (ref 22–32)
Calcium: 9.6 mg/dL (ref 8.9–10.3)
Chloride: 105 mmol/L (ref 98–111)
Creatinine, Ser: 0.8 mg/dL (ref 0.44–1.00)
GFR, Estimated: >60 mL/min (ref >60)
Glucose, Bld: 99 mg/dL (ref 70–99)
Potassium: 4.3 mmol/L (ref 3.5–5.1)
Sodium: 140 mmol/L (ref 135–145)

## 2021-10-21 LAB — MAGNESIUM: Magnesium: 2 mg/dL (ref 1.7–2.4)

## 2021-10-21 LAB — PROTIME-INR
INR: 2.7 — ABNORMAL HIGH (ref 0.8–1.2)
Prothrombin Time: 28.5 seconds — ABNORMAL HIGH (ref 11.4–15.2)

## 2021-10-21 NOTE — Care Management Important Message (Signed)
Important Message  Patient Details  Name: Laura David MRN: [id number] Date of Birth: 05-09-1934   Medicare Important Message Given:  N/A - LOS <3 / Initial given by admissions     Laura David 10/21/2021, 9:31 AM

## 2021-10-21 NOTE — Progress Notes (Signed)
Physical Therapy Treatment Patient Details Name: Laura David MRN: [id number] DOB: 07/06/1934 Today's Date: 10/21/2021   History of Present Illness Pt is an 87 y/o F admitted on 10/18/21 after being brought in by her family 2/2 confusion worse than baseline, aggressive & combative behaviors, recurrent falls & not eating/drinking well. Pt is being treated for dehydration, recurrent falls & progressive dementia with recurrent behavioral issues related to paranoia & aggression. PMH: HTN, dyslipidemia, PAF on warfarin, breast CA, SDH, COPD/asthma, recurrent falls, dementa, R rotator cuff tear & arthritis    PT Comments    Pt received upright in recliner  agreeable to PT. Tolerating LE therex well with min multimodal cuing. X2 STS performed requiring min to mod VC's for anterior weight shift to reduce post lean. Pt ambulating 2x20' bouts with seated rest on EOB b/t bouts with RW far anteriorly to pt outside BOS.despite VC's and minA on RW along with poor foot placement outside RW when turning around bed and for stand to sit transfers. Pt able to correct turns with step by step cuing. Pt returning to recliner with all needs in reach. Noted increased WOB due to LE fatigue. Pt progressing in mobility but still a high falls risk due to poor use of AD and decreased safety awareness.   Recommendations for follow up therapy are one component of a multi-disciplinary discharge planning process, led by the attending physician.  Recommendations may be updated based on patient status, additional functional criteria and insurance authorization.  Follow Up Recommendations  Skilled nursing-short term rehab (<3 hours/day) Can patient physically be transported by private vehicle: Yes   Assistance Recommended at Discharge Frequent or constant Supervision/Assistance  Patient can return home with the following A little help with walking and/or transfers;A little help with bathing/dressing/bathroom;Assist for  transportation;Help with stairs or ramp for entrance;Assistance with cooking/housework;Direct supervision/assist for financial management;Direct supervision/assist for medications management;Assistance with feeding   Equipment Recommendations  None recommended by PT    Recommendations for Other Services       Precautions / Restrictions Precautions Precautions: Fall Restrictions Weight Bearing Restrictions: No     Mobility  Bed Mobility               General bed mobility comments: Patient in recliner upon arrival Patient Response: Cooperative  Transfers Overall transfer level: Needs assistance Equipment used: Rolling walker (2 wheels) Transfers: Sit to/from Stand Sit to Stand: Min guard           General transfer comment: VC's for hand placement    Ambulation/Gait Ambulation/Gait assistance: Min guard, Min assist Gait Distance (Feet): 40 Feet Assistive device: Rolling walker (2 wheels) Gait Pattern/deviations: Step-to pattern, Decreased step length - right, Decreased step length - left, Decreased dorsiflexion - right, Decreased dorsiflexion - left, Decreased stride length, Shuffle       General Gait Details: Poor use of RW keeping far outside BOS; turns with feet outside support of RW despite max VC's   Stairs             Wheelchair Mobility    Modified Rankin (Stroke Patients Only)       Balance Overall balance assessment: Needs assistance, History of Falls Sitting-balance support: Feet supported Sitting balance-Leahy Scale: Good     Standing balance support: During functional activity, Bilateral upper extremity supported, Reliant on assistive device for balance Standing balance-Leahy Scale: Fair  Cognition Arousal/Alertness: Awake/alert Behavior During Therapy: WFL for tasks assessed/performed Overall Cognitive Status: History of cognitive impairments - at baseline                                           Exercises General Exercises - Lower Extremity Ankle Circles/Pumps: AROM, Strengthening, Both, 10 reps, Supine Long Arc Quad: AROM, Strengthening, Both, 10 reps, Seated Hip ABduction/ADduction: AROM, Strengthening, Both, 10 reps, Supine Hip Flexion/Marching: AROM, Strengthening, Both, 10 reps, Seated    General Comments        Pertinent Vitals/Pain Pain Assessment Pain Assessment: No/denies pain    Home Living                          Prior Function            PT Goals (current goals can now be found in the care plan section) Acute Rehab PT Goals Patient Stated Goal: none stated    Frequency    Min 2X/week      PT Plan Current plan remains appropriate    Co-evaluation              AM-PAC PT "6 Clicks" Mobility   Outcome Measure  Help needed turning from your back to your side while in a flat bed without using bedrails?: A Little Help needed moving from lying on your back to sitting on the side of a flat bed without using bedrails?: A Lot Help needed moving to and from a bed to a chair (including a wheelchair)?: A Little Help needed standing up from a chair using your arms (e.g., wheelchair or bedside chair)?: A Little Help needed to walk in hospital room?: A Little Help needed climbing 3-5 steps with a railing? : A Lot 6 Click Score: 16    End of Session Equipment Utilized During Treatment: Gait belt Activity Tolerance: Patient tolerated treatment well Patient left: in chair;with call bell/phone within reach;with chair alarm set Nurse Communication: Mobility status PT Visit Diagnosis: History of falling (Z91.81);Muscle weakness (generalized) (M62.81);Other abnormalities of gait and mobility (R26.89);Unsteadiness on feet (R26.81)     Time: 1020-1050 PT Time Calculation (min) (ACUTE ONLY): 30 min  Charges:  $Gait Training: 8-22 mins $Therapeutic Exercise: 8-22 mins                     Laura Dy M. David IV, PT,  DPT Physical Therapist- [facility]  10/21/2021, 1:02 PM

## 2021-10-21 NOTE — Plan of Care (Signed)

## 2021-10-21 NOTE — Progress Notes (Signed)
ANTICOAGULATION CONSULT NOTE -   Pharmacy Consult for warfarin Indication: atrial fibrillation  Allergies  Allergen Reactions   Flagyl [Metronidazole] Nausea Only   Pacerone [Amiodarone]     Eye deposits   Ramipril Cough    Patient Measurements: Height: 5' (152.4 cm) Weight: 45.8 kg (100 lb 15.5 oz) IBW/kg (Calculated) : 45.5  Vital Signs: Temp: 98.3 F (36.8 C) (10/15 2149) Temp Source: Oral (10/15 2149) BP: 160/91 (10/16 0558) Pulse Rate: 64 (10/16 0558)  Labs: Recent Labs    10/19/21 0714 10/19/21 2214 10/20/21 0723 10/20/21 1150 10/21/21 0458  HGB  --  16.2*  --   --  15.5*  HCT  --  47.9*  --   --  46.8*  PLT  --  285  --   --  262  LABPROT 20.4*  --   --  25.4* 28.5*  INR 1.8*  --   --  2.3* 2.7*  CREATININE 0.77  --  0.72  --  0.80     Estimated Creatinine Clearance: 36.9 mL/min (by C-G formula based on SCr of 0.8 mg/dL).   Medical History: Past Medical History:  Diagnosis Date   Asthma    Breast cancer ([place]) 2010   RIGHT   Cancer ([place])    basel cell, s/p MOH'S/DR. 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 Regarding paroxysmal Afib, patient's CHADSVASc is 5, HR stable and WNL, and INR is currently at goal 2-3.  Drug-drug interactions: None significant  Date INR Warfarin Dose Comment 10/13 2.1 '1mg'$  10/14 1.8 '2mg'$    INR slightly subtherapeutic; increase dose x1 10/15 2.3 '2mg'$  10/16 2.7  Goal of Therapy:  INR 2-3   Plan:  INR therapeutic,  will continue PTA dosing with '1mg'$  of warfarin due today. Follow up daily IND and CBC every 3 days  Makisha Marrin Rodriguez-Guzman PharmD, BCPS 10/21/2021 7:55 AM

## 2021-10-21 NOTE — Progress Notes (Signed)
  PROGRESS NOTE    Laura David  ZOX:[id number] DOB: December 17, 1934 DOA: 10/18/2021 PCP: [name], MD  143A/[id number]-AA  LOS: 3 days   Brief hospital course:   Assessment & Plan: Laura David is a 87 y.o. female with medical history significant of hypertension, PAF on warfarin, history of breast cancer, history of prior subdural hematoma, COPD/asthma, recurrent falls, known dementia, right shoulder pain secondary to rotator cuff tear and glenohumeral arthritis.  She was brought to the ER by her family due to behavioral changes, confusion worse than baseline as well as aggressive, paranoia and combative behaviors at home, and excessive sleep.  She has also had worsening weakness and recurrent falls.  Was recently hospitalized and discharged on 10/6 after being treated for UTI as well as right shoulder pain.    Recurrent falls --2/2 worsening weakness and not using her wheelchair. --PT/OT rec SNF rehab   Progressive dementia with recurrent behavioral issues related to paranoia and aggression --mental status much improved the day after presentation.      Chronic shoulder pain in context of rotator cuff tear and glenohumeral arthritis Patient reports Tylenol ineffective --cont lidocaine patch (new) --cont Oxy IR 2.5 mg every 8 hours PRN (new)   Hypertension --cont amlodipine and Lopressor   PAF Coumadin per pharmacy Continue beta-blocker   Dyslipidemia Continue Lipitor   DVT prophylaxis: [id number]:[id number] Code Status: DNR  Family Communication: friend updated at bedside today Level of care: Med-Surg Dispo:   The patient is from: home Anticipated d/c is to: SNF rehab Anticipated d/c date is: whenever bed available   Subjective and Interval History:  No complaint.  Visiting with a friend.   Objective: Vitals:   10/20/21 2149 10/21/21 0558 10/21/21 0825 10/21/21 1653  BP: (!) 133/94 (!) 160/91 (!) 156/95 127/77  Pulse: 77 64 66 76  Resp: '18 18 16   '$ Temp: 98.3 F  (36.8 C)  (!) 97.5 F (36.4 C) 97.6 F (36.4 C)  TempSrc: Oral   Oral  SpO2: 100% 100% 100%   Weight:      Height:        Intake/Output Summary (Last 24 hours) at 10/21/2021 2000 Last data filed at 10/21/2021 1953 Gross per 24 hour  Intake 480 ml  Output 750 ml  Net -270 ml   Filed Weights   10/17/21 2255 10/19/21 2059  Weight: 48.1 kg 45.8 kg    Examination:   Constitutional: NAD, alert, oriented to person and place, sitting in recliner HEENT: conjunctivae and lids normal, EOMI CV: No cyanosis.   RESP: normal respiratory effort, on RA Neuro: II - XII grossly intact.   Psych: Normal mood and affect.     Data Reviewed: I have personally reviewed labs and imaging studies  Time spent: 25 minutes  Enzo Bi, MD [facility] If 7PM-7AM, please contact night-coverage 10/21/2021, 8:00 PM

## 2021-10-21 NOTE — TOC Progression Note (Signed)
Transition of Care [facility]) - Progression Note    Patient Details  Name: Laura David MRN: [id number] Date of Birth: 1934-06-15  Transition of Care [facility]) CM/SW [place], RN Phone Number: 10/21/2021, 9:02 AM  Clinical Narrative:     No bed offers at this point, resent the request for beds, they would like to stay local    Barriers to Discharge: Continued Medical Work up  Expected Discharge Plan and Services                                                 Social Determinants of Health (SDOH) Interventions Food Insecurity Interventions: Intervention Not Indicated Housing Interventions: Intervention Not Indicated Transportation Interventions: Intervention Not Indicated Utilities Interventions: Intervention Not Indicated  Readmission Risk Interventions    10/21/2021    9:01 AM 01/13/2020    4:33 PM  Readmission Risk Prevention Plan  Transportation Screening Complete Complete  PCP or Specialist Appt within 3-5 Days Complete   Medication Review (RN CM)  Complete  HRI or Home Care Consult Complete   Social Work Consult for [place] Planning/Counseling Complete   Palliative Care Screening Not Applicable   Medication Review Press photographer) Referral to Pharmacy

## 2021-10-22 ENCOUNTER — Other Ambulatory Visit (HOSPITAL_COMMUNITY): Payer: Self-pay

## 2021-10-22 ENCOUNTER — Telehealth (HOSPITAL_COMMUNITY): Payer: Self-pay | Admitting: Pharmacy Technician

## 2021-10-22 DIAGNOSIS — E860 Dehydration: Secondary | ICD-10-CM | POA: Diagnosis not present

## 2021-10-22 LAB — PROTIME-INR
INR: 2.6 — ABNORMAL HIGH (ref 0.8–1.2)
Prothrombin Time: 28 seconds — ABNORMAL HIGH (ref 11.4–15.2)

## 2021-10-22 NOTE — TOC Progression Note (Signed)
Transition of Care [facility]) - Progression Note    Patient Details  Name: Laura David MRN: [id number] Date of Birth: 04-Aug-1934  Transition of Care [facility]) CM/SW [place], RN Phone Number: 10/22/2021, 9:05 AM  Clinical Narrative:   The patient still has no bed offers, resent thru out the [facility]      Barriers to Discharge: Continued Medical Work up  Expected Discharge Plan and Services                                                 Social Determinants of Health (SDOH) Interventions Food Insecurity Interventions: Intervention Not Indicated Housing Interventions: Intervention Not Indicated Transportation Interventions: Intervention Not Indicated Utilities Interventions: Intervention Not Indicated  Readmission Risk Interventions    10/21/2021    9:01 AM 01/13/2020    4:33 PM  Readmission Risk Prevention Plan  Transportation Screening Complete Complete  PCP or Specialist Appt within 3-5 Days Complete   Medication Review (RN CM)  Complete  HRI or Home Care Consult Complete   Social Work Consult for [place] Planning/Counseling Complete   Palliative Care Screening Not Applicable   Medication Review Press photographer) Referral to Pharmacy

## 2021-10-22 NOTE — TOC Benefit Eligibility Note (Signed)
Patient Advocate Encounter  Insurance verification completed.    The patient is currently admitted and upon discharge could be taking Eliquis 5 mg.  The current 30 day co-pay is $47.00.   The patient is insured through [employer] Medicare Part D     Simmie Camerer, CPhT Pharmacy Patient Advocate Specialist [facility] Patient Advocate Team Direct Number: [phone]  Fax: [phone]        

## 2021-10-22 NOTE — Telephone Encounter (Signed)
Pharmacy Patient Advocate Encounter  Insurance verification completed.    The patient is insured through [employer] Medicare Part D   The patient is currently admitted and ran test claims for the following: Eliquis.  Copays and coinsurance results were relayed to Inpatient clinical team.  

## 2021-10-22 NOTE — Progress Notes (Signed)
Occupational Therapy Treatment Patient Details Name: Laura David MRN: [id number] DOB: May 14, 1934 Today's Date: 10/22/2021   History of present illness Pt is an 87 y/o F admitted on 10/18/21 after being brought in by her family 2/2 confusion worse than baseline, aggressive & combative behaviors, recurrent falls & not eating/drinking well. Pt is being treated for dehydration, recurrent falls & progressive dementia with recurrent behavioral issues related to paranoia & aggression. PMH: HTN, dyslipidemia, PAF on warfarin, breast CA, SDH, COPD/asthma, recurrent falls, dementa, R rotator cuff tear & arthritis   OT comments  Patient in recliner upon arrival and agreeable to OT services. Patient was able to transfer sit <> stand with min guard/ supervision, requiring mod multimodal cues for management and hand placement on RW. Transferred to [facility] with min guard/ supervision. Min guard for peri-care and LB clothing management. Patient ambulated ~11f to sink to perform grooming tasks (oral care, washing face, and combing hair.) Patient with c/o knee pain, declined medication. Required supervision for bed mobility sit>supine.Patient placed in bed with call bell in reach, bed alarm set, and all needs met.    Recommendations for follow up therapy are one component of a multi-disciplinary discharge planning process, led by the attending physician.  Recommendations may be updated based on patient status, additional functional criteria and insurance authorization.    Follow Up Recommendations  Skilled nursing-short term rehab (<3 hours/day)    Assistance Recommended at Discharge Intermittent Supervision/Assistance  Patient can return home with the following  Assistance with cooking/housework;Direct supervision/assist for medications management;Help with stairs or ramp for entrance;A little help with walking and/or transfers;A little help with bathing/dressing/bathroom   Equipment Recommendations  Other  (comment) (Defer to next venue of care.)       Precautions / Restrictions Precautions Precautions: Fall Restrictions Weight Bearing Restrictions: No       Mobility Bed Mobility Overal bed mobility: Needs Assistance Bed Mobility: Sit to Supine       Sit to supine: Supervision   General bed mobility comments: Patient in recliner upon arrival    Transfers Overall transfer level: Needs assistance Equipment used: Rolling walker (2 wheels) Transfers: Sit to/from Stand Sit to Stand: Min guard, Supervision           General transfer comment: multimodal for hand placement and management of RW     Balance Overall balance assessment: History of Falls, Needs assistance Sitting-balance support: Feet supported Sitting balance-Leahy Scale: Good Sitting balance - Comments: able to perform pericare   Standing balance support: During functional activity, Bilateral upper extremity supported, Reliant on assistive device for balance Standing balance-Leahy Scale: Fair                             ADL either performed or assessed with clinical judgement   ADL Overall ADL's : Needs assistance/impaired     Grooming: Oral care;Wash/dry face;Brushing hair;Supervision/safety;Min guard                   Toilet Transfer: Min gArmed forces training and education officerand Hygiene: Min guard         General ADL Comments: Pt required min gaurd/ supervision for toilet transfer to [facility] VC for RW mgt and hand placement    Extremity/Trunk Assessment Upper Extremity Assessment Upper Extremity Assessment: Generalized weakness   Lower Extremity Assessment Lower Extremity Assessment: Generalized weakness        Vision Baseline Vision/History: 1 Wears glasses Patient Visual  Report: No change from baseline            Cognition Arousal/Alertness: Awake/alert Behavior During Therapy: WFL for tasks assessed/performed Overall Cognitive Status:  History of cognitive impairments - at baseline                                 General Comments: Difficulty with good carryover after multimodal cuing for improved safety awareness. Very pleasant and cooperative.                   Pertinent Vitals/ Pain       Pain Assessment Pain Assessment: Faces Faces Pain Scale: Hurts little more Pain Location: back and knee pain Pain Descriptors / Indicators: Discomfort, Aching Pain Intervention(s): Limited activity within patient's tolerance, Monitored during session, Repositioned   Frequency  Min 2X/week        Progress Toward Goals  OT Goals(current goals can now be found in the care plan section)  Progress towards OT goals: Progressing toward goals  Acute Rehab OT Goals Patient Stated Goal: to get better OT Goal Formulation: With patient/family Time For Goal Achievement: 11/01/21 Potential to Achieve Goals: Good  Plan Discharge plan remains appropriate;Frequency remains appropriate       AM-PAC OT "6 Clicks" Daily Activity     Outcome Measure   Help from another person eating meals?: None Help from another person taking care of personal grooming?: A Little Help from another person toileting, which includes using toliet, bedpan, or urinal?: A Little Help from another person bathing (including washing, rinsing, drying)?: A Little Help from another person to put on and taking off regular upper body clothing?: A Little Help from another person to put on and taking off regular lower body clothing?: A Little 6 Click Score: 19    End of Session Equipment Utilized During Treatment: Rolling walker (2 wheels)  OT Visit Diagnosis: History of falling (Z91.81);Other abnormalities of gait and mobility (R26.89);Muscle weakness (generalized) (M62.81)   Activity Tolerance Patient tolerated treatment well;Patient limited by pain   Patient Left in bed;with call bell/phone within reach;with bed alarm set   Nurse  Communication Mobility status        Time: 1359-1430 OT Time Calculation (min): 31 min  Charges: OT General Charges $OT Visit: 1 Visit OT Treatments $Self Care/Home Management : 23-37 mins    Maryl Blalock, OTS 10/22/2021, 3:45 PM

## 2021-10-22 NOTE — Progress Notes (Signed)
  PROGRESS NOTE    Laura David  NIO:[id number] DOB: [birth year] DOA: 10/18/2021 PCP: [name], MD  143A/[id number]-AA  LOS: 4 days   Brief hospital course:   Assessment & Plan: Laura David is a 87 y.o. female with medical history significant of hypertension, PAF on warfarin, history of breast cancer, history of prior subdural hematoma, COPD/asthma, recurrent falls, known dementia, right shoulder pain secondary to rotator cuff tear and glenohumeral arthritis.  She was brought to the ER by her family due to behavioral changes, confusion worse than baseline as well as aggressive, paranoia and combative behaviors at home, and excessive sleep.  She has also had worsening weakness and recurrent falls.  Was recently hospitalized and discharged on 10/6 after being treated for UTI as well as right shoulder pain.    Recurrent falls --2/2 worsening weakness and not using her wheelchair. --PT/OT rec SNF rehab   Progressive dementia with recurrent behavioral issues related to paranoia and aggression --mental status much improved the day after presentation.  Has been calm and cooperative.    Chronic shoulder pain in context of rotator cuff tear and glenohumeral arthritis Patient reports Tylenol ineffective --cont lidocaine patch (new) --cont Oxy IR 2.5 mg every 8 hours PRN (new)   Hypertension --cont amlodipine and Lopressor   PAF Coumadin per pharmacy Continue beta-blocker   Dyslipidemia Continue Lipitor   DVT prophylaxis: GH:[id number] Code Status: DNR  Family Communication:  Level of care: Med-Surg Dispo:   The patient is from: home Anticipated d/c is to: SNF rehab Anticipated d/c date is: whenever bed available   Subjective and Interval History:  Working with OT today.   Objective: Vitals:   10/21/21 1653 10/21/21 2112 10/22/21 0847 10/22/21 1624  BP: 127/77 128/70 (!) 148/86 (!) 150/84  Pulse: 76 79 (!) 59 83  Resp:  '16 16 16  '$ Temp: 97.6 F (36.4 C) 97.8 F  (36.6 C) (!) 97.5 F (36.4 C) 97.6 F (36.4 C)  TempSrc: Oral     SpO2:  100% 100% 100%  Weight:      Height:        Intake/Output Summary (Last 24 hours) at 10/22/2021 2211 Last data filed at 10/22/2021 0300 Gross per 24 hour  Intake --  Output 400 ml  Net -400 ml   Filed Weights   10/17/21 2255 10/19/21 2059  Weight: 48.1 kg 45.8 kg    Examination:   Constitutional: NAD, alert, oriented to person and place, standing at the sink HEENT: conjunctivae and lids normal, EOMI CV: No cyanosis.   RESP: normal respiratory effort Neuro: II - XII grossly intact.   Psych: Normal mood and affect.     Data Reviewed: I have personally reviewed labs and imaging studies  Time spent: 25 minutes  Laura Bi, MD [facility] If 7PM-7AM, please contact night-coverage 10/22/2021, 10:11 PM

## 2021-10-22 NOTE — Plan of Care (Signed)
  Problem: Education: Goal: Knowledge of General Education information will improve Description: Including pain rating scale, medication(s)/side effects and non-pharmacologic comfort measures Outcome: Progressing   Problem: Health Behavior/Discharge Planning: Goal: Ability to manage health-related needs will improve Outcome: Progressing   Problem: Clinical Measurements: Goal: Ability to maintain clinical measurements within normal limits will improve Outcome: Progressing Goal: Will remain free from infection Outcome: Progressing Goal: Diagnostic test results will improve Outcome: Progressing Goal: Respiratory complications will improve Outcome: Progressing   Problem: Activity: Goal: Risk for activity intolerance will decrease Outcome: Progressing   Problem: Nutrition: Goal: Adequate nutrition will be maintained Outcome: Progressing   Problem: Coping: Goal: Level of anxiety will decrease Outcome: Progressing   Problem: Pain Managment: Goal: General experience of comfort will improve Outcome: Progressing   Problem: Safety: Goal: Ability to remain free from injury will improve Outcome: Progressing   Problem: Skin Integrity: Goal: Risk for impaired skin integrity will decrease Outcome: Progressing   

## 2021-10-22 NOTE — Progress Notes (Signed)
ANTICOAGULATION CONSULT NOTE -   Pharmacy Consult for warfarin Indication: atrial fibrillation  Allergies  Allergen Reactions   Flagyl [Metronidazole] Nausea Only   Pacerone [Amiodarone]     Eye deposits   Ramipril Cough    Patient Measurements: Height: 5' (152.4 cm) Weight: 45.8 kg (100 lb 15.5 oz) IBW/kg (Calculated) : 45.5  Vital Signs: Temp: 97.5 F (36.4 C) (10/17 0847) BP: 148/86 (10/17 0847) Pulse Rate: 59 (10/17 0847)  Labs: Recent Labs    10/19/21 2214 10/20/21 0723 10/20/21 1150 10/21/21 0458 10/22/21 0727  HGB 16.2*  --   --  15.5*  --   HCT 47.9*  --   --  46.8*  --   PLT 285  --   --  262  --   LABPROT  --   --  25.4* 28.5* 28.0*  INR  --   --  2.3* 2.7* 2.6*  CREATININE  --  0.72  --  0.80  --      Estimated Creatinine Clearance: 36.9 mL/min (by C-G formula based on SCr of 0.8 mg/dL).   Medical History: Past Medical History:  Diagnosis Date   Asthma    Breast cancer ([place]) 2010   RIGHT   Cancer ([place])    basel cell, s/p MOH'S/DR. 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 Regarding paroxysmal Afib, patient's CHADSVASc is 5, HR stable and WNL, and INR is currently at goal 2-3.  Drug-drug interactions: None significant  Date INR Warfarin Dose Comment 10/13 2.1 '1mg'$  10/14 1.8 '2mg'$    INR slightly subtherapeutic; increase dose x1 10/15 2.3 '2mg'$  10/16 2.7 1 mg 10/17 2.6  Goal of Therapy:  INR 2-3   Plan:  INR therapeutic, will continue  PTA dosing of 1 mg Mon,Tues, Thu, Fri,Sat and 2 mg Wed, Sun Follow up daily IND and CBC every 3 days  Chinita Greenland PharmD Clinical Pharmacist 10/22/2021

## 2021-10-22 NOTE — Progress Notes (Signed)
Physical Therapy Treatment Patient Details Name: Laura David MRN: [id number] DOB: Oct 22, 1934 Today's Date: 10/22/2021   History of Present Illness Pt is an 87 y/o F admitted on 10/18/21 after being brought in by her family 2/2 confusion worse than baseline, aggressive & combative behaviors, recurrent falls & not eating/drinking well. Pt is being treated for dehydration, recurrent falls & progressive dementia with recurrent behavioral issues related to paranoia & aggression. PMH: HTN, dyslipidemia, PAF on warfarin, breast CA, SDH, COPD/asthma, recurrent falls, dementa, R rotator cuff tear & arthritis    PT Comments    Pt received sleeping in bed agreeable to PT services. Pt remains on her baseline 2L/min. Performs bed mobility with minA at torso due to baseline R shoulder pain and poor AROM. Pt standing to RW with minguard and multimodal cuing for hand placement performing all gait with RW a total of 60' without need for seated rest. Pt remains unaware of deficits and with poor use of RW with very limited carryover despite mod to max multimodal cuing for RW proximity, sequencing, turns, and stand to sit transfers. Pt placed in recliner with all needs in reach. D/c recs remain appropriate due to safety awareness and increased falls risk, however pt is making progress with mobility.    Recommendations for follow up therapy are one component of a multi-disciplinary discharge planning process, led by the attending physician.  Recommendations may be updated based on patient status, additional functional criteria and insurance authorization.  Follow Up Recommendations  Skilled nursing-short term rehab (<3 hours/day) Can patient physically be transported by private vehicle: Yes   Assistance Recommended at Discharge Frequent or constant Supervision/Assistance  Patient can return home with the following A little help with walking and/or transfers;A little help with bathing/dressing/bathroom;Assist for  transportation;Help with stairs or ramp for entrance;Assistance with cooking/housework;Direct supervision/assist for financial management;Direct supervision/assist for medications management;Assistance with feeding   Equipment Recommendations  None recommended by PT    Recommendations for Other Services       Precautions / Restrictions Precautions Precautions: Fall Restrictions Weight Bearing Restrictions: No     Mobility  Bed Mobility Overal bed mobility: Needs Assistance Bed Mobility: Supine to Sit     Supine to sit: Min assist, HOB elevated       Patient Response: Cooperative  Transfers Overall transfer level: Needs assistance Equipment used: Rolling walker (2 wheels) Transfers: Sit to/from Stand Sit to Stand: Min guard           General transfer comment: VC's for hand placement    Ambulation/Gait Ambulation/Gait assistance: Min guard Gait Distance (Feet): 60 Feet Assistive device: Rolling walker (2 wheels) Gait Pattern/deviations: Step-to pattern, Decreased step length - right, Decreased step length - left, Decreased dorsiflexion - right, Decreased dorsiflexion - left, Decreased stride length, Shuffle       General Gait Details: Poor use of RW keeping far outside BOS; turns with feet outside support of RW despite max VC's   Stairs             Wheelchair Mobility    Modified Rankin (Stroke Patients Only)       Balance Overall balance assessment: Needs assistance, History of Falls Sitting-balance support: Feet supported Sitting balance-Leahy Scale: Good     Standing balance support: During functional activity, Bilateral upper extremity supported, Reliant on assistive device for balance Standing balance-Leahy Scale: Fair  Cognition Arousal/Alertness: Awake/alert Behavior During Therapy: WFL for tasks assessed/performed Overall Cognitive Status: History of cognitive impairments - at baseline                                  General Comments: Difficulty with good carryover after multimodal cuing for improved safety awareness. Very pleasant and cooperative.        Exercises      General Comments        Pertinent Vitals/Pain Pain Assessment Pain Assessment: No/denies pain    Home Living                          Prior Function            PT Goals (current goals can now be found in the care plan section) Acute Rehab PT Goals Patient Stated Goal: none stated    Frequency    Min 2X/week      PT Plan Current plan remains appropriate    Co-evaluation              AM-PAC PT "6 Clicks" Mobility   Outcome Measure  Help needed turning from your back to your side while in a flat bed without using bedrails?: A Little Help needed moving from lying on your back to sitting on the side of a flat bed without using bedrails?: A Lot Help needed moving to and from a bed to a chair (including a wheelchair)?: A Little Help needed standing up from a chair using your arms (e.g., wheelchair or bedside chair)?: A Little Help needed to walk in hospital room?: A Little Help needed climbing 3-5 steps with a railing? : A Lot 6 Click Score: 16    End of Session Equipment Utilized During Treatment: Gait belt;Oxygen Activity Tolerance: Patient tolerated treatment well Patient left: in chair;with call bell/phone within reach;with chair alarm set Nurse Communication: Mobility status PT Visit Diagnosis: History of falling (Z91.81);Muscle weakness (generalized) (M62.81);Other abnormalities of gait and mobility (R26.89);Unsteadiness on feet (R26.81)     Time: 1610-9604 PT Time Calculation (min) (ACUTE ONLY): 31 min  Charges:  $Gait Training: 23-37 mins                     Salem Caster. Fairly IV, PT, DPT Physical Therapist- [facility]  10/22/2021, 1:10 PM

## 2021-10-22 NOTE — Care Management Important Message (Signed)
Important Message  Patient Details  Name: Laura David MRN: [id number] Date of Birth: [birth year]   Medicare Important Message Given:  N/A - LOS <3 / Initial given by admissions     Laura David 10/22/2021, 9:07 AM

## 2021-10-22 NOTE — Plan of Care (Signed)

## 2021-10-23 DIAGNOSIS — E860 Dehydration: Secondary | ICD-10-CM | POA: Diagnosis not present

## 2021-10-23 LAB — PROTIME-INR
INR: 2.4 — ABNORMAL HIGH (ref 0.8–1.2)
Prothrombin Time: 25.6 seconds — ABNORMAL HIGH (ref 11.4–15.2)

## 2021-10-23 MED ORDER — METOPROLOL TARTRATE 25 MG PO TABS
12.5000 mg | ORAL_TABLET | Freq: Two times a day (BID) | ORAL | Status: DC
  Administered 2021-10-23 – 2021-11-01 (×18): 12.5 mg via ORAL
  Filled 2021-10-23 (×19): qty 1

## 2021-10-23 MED ORDER — ALUM & MAG HYDROXIDE-SIMETH 200-200-20 MG/5ML PO SUSP
15.0000 mL | Freq: Four times a day (QID) | ORAL | Status: DC | PRN
  Administered 2021-10-23: 15 mL via ORAL
  Filled 2021-10-23: qty 30

## 2021-10-23 NOTE — Progress Notes (Signed)
[facility] NAME: Laura David    MR#:  [id number]  DATE OF BIRTH:  1934/06/15  SUBJECTIVE:   No family at bedside. Patient is quite pleasant and cooperative. No issues with agitation reported. During my evaluation starting to work with occupational therapist   VITALS:  Blood pressure 108/72, pulse 69, temperature 98 F (36.7 C), resp. rate 16, height 5' (1.524 m), weight 45.8 kg, SpO2 94 %.  PHYSICAL EXAMINATION:   GENERAL:  87 y.o.-year-old patient lying in the bed with no acute distress.  LUNGS: Normal breath sounds bilaterally, no wheezing CARDIOVASCULAR: S1, S2 normal. No murmurs,   ABDOMEN: Soft, nontender, nondistended. Bowel sounds present.  EXTREMITIES: No  edema b/l.    NEUROLOGIC: nonfocal  patient is alert  SKIN: No obvious rash, lesion, or ulcer.   LABORATORY PANEL:  CBC Recent Labs  Lab 10/21/21 0458  WBC 14.7*  HGB 15.5*  HCT 46.8*  PLT 262    Chemistries  Recent Labs  Lab 10/17/21 2342 10/19/21 0714 10/21/21 0458  NA 137   < > 140  K 4.2   < > 4.3  CL 104   < > 105  CO2 24   < > 26  GLUCOSE 100*   < > 99  BUN 23   < > 23  CREATININE 0.73   < > 0.80  CALCIUM 9.9   < > 9.6  MG  --    < > 2.0  AST 39  --   --   ALT 34  --   --   ALKPHOS 57  --   --   BILITOT 1.0  --   --    < > = values in this interval not displayed.    Assessment and Plan  Laura David is a 87 y.o. female with medical history significant of hypertension, PAF on warfarin, history of breast cancer, history of prior subdural hematoma, COPD/asthma, recurrent falls, known dementia, right shoulder pain secondary to rotator cuff tear and glenohumeral arthritis.   She was brought to the ER by her family due to behavioral changes, confusion worse than baseline as well as aggressive, paranoia and combative behaviors at home, and excessive sleep.  She has also had worsening weakness and recurrent falls.   Was recently  hospitalized and discharged on 10/6 after being treated for UTI as well as right shoulder pain.     Recurrent falls --2/2 worsening weakness and not using her wheelchair. --PT/OT rec SNF rehab   Progressive dementia with recurrent behavioral issues related to paranoia and aggression --mental status much improved the day after presentation.  Has been calm and cooperative.    Chronic shoulder pain in context of rotator cuff tear and glenohumeral arthritis Patient reports Tylenol ineffective --cont lidocaine patch (new) --cont Oxy IR 2.5 mg every 8 hours PRN (new)   Hypertension --cont amlodipine and Lopressor   PAF Coumadin per pharmacy Continue beta-blocker   Dyslipidemia Continue Lipitor     DVT prophylaxis: [id number] Code Status: DNR  Family Communication:  Level of care: Med-Surg Dispo:   The patient is from: home Anticipated d/c is to: SNF rehab Anticipated d/c date is: whenever bed available         TOTAL TIME TAKING CARE OF THIS PATIENT: 35 minutes.  >50% time spent on counselling and coordination of care  Note: This dictation was prepared with Dragon dictation along with smaller phrase technology. Any transcriptional errors that  result from this process are unintentional.  Fritzi Mandes M.D    [facility]   CC: Primary care physician; [name], MD

## 2021-10-23 NOTE — Progress Notes (Addendum)
ANTICOAGULATION CONSULT NOTE -   Pharmacy Consult for warfarin Indication: atrial fibrillation  Allergies  Allergen Reactions   Flagyl [Metronidazole] Nausea Only   Pacerone [Amiodarone]     Eye deposits   Ramipril Cough    Patient Measurements: Height: 5' (152.4 cm) Weight: 45.8 kg (100 lb 15.5 oz) IBW/kg (Calculated) : 45.5  Vital Signs: Temp: 98.1 F (36.7 C) (10/18 0735) BP: 142/70 (10/18 0735) Pulse Rate: 63 (10/18 0735)  Labs: Recent Labs    10/21/21 0458 10/22/21 0727 10/23/21 0419  HGB 15.5*  --   --   HCT 46.8*  --   --   PLT 262  --   --   LABPROT 28.5* 28.0* 25.6*  INR 2.7* 2.6* 2.4*  CREATININE 0.80  --   --      Estimated Creatinine Clearance: 36.9 mL/min (by C-G formula based on SCr of 0.8 mg/dL).   Medical History: Past Medical History:  Diagnosis Date   Asthma    Breast cancer ([place]) 2010   RIGHT   Cancer ([place])    basel cell, s/p MOH'S/DR. 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 Regarding paroxysmal Afib, patient's CHADSVASc is 5, HR stable and WNL, and INR is currently at goal 2-3.  Drug-drug interactions: None significant  Date INR Warfarin Dose Comment 10/13 2.1 '1mg'$  10/14 1.8 '2mg'$    INR slightly subtherapeutic; increase dose x1 10/15 2.3 '2mg'$  10/16 2.7 1 mg 10/17 2.6 '1mg'$  10/18 2.4  Goal of Therapy:  INR 2-3   Plan:  INR therapeutic, will continue PTA dosing of 1 mg Mon,Tues, Thu, Fri,Sat and 2 mg Wed, Sun Follow INR and  CBC every 3 days (discussed with MD as well)  Tajai Suder Rodriguez-Guzman PharmD, BCPS 10/23/2021 8:04 AM

## 2021-10-23 NOTE — TOC Progression Note (Signed)
Transition of Care [facility]) - Progression Note    Patient Details  Name: Laura David MRN: [id number] Date of Birth: 07-18-1934  Transition of Care [facility]) CM/SW [place], RN Phone Number: 10/23/2021, 10:00 AM  Clinical Narrative:     Still no bed offers, sent out to all of the hub and called several asking to review, awaiting bed offers, spoke with Lennette Bihari the patient's son and POA and explained what we are doing, he is agreeable    Barriers to Discharge: Continued Medical Work up  Expected Discharge Plan and Services                                                 Social Determinants of Health (SDOH) Interventions Food Insecurity Interventions: Intervention Not Indicated Housing Interventions: Intervention Not Indicated Transportation Interventions: Intervention Not Indicated Utilities Interventions: Intervention Not Indicated  Readmission Risk Interventions    10/21/2021    9:01 AM 01/13/2020    4:33 PM  Readmission Risk Prevention Plan  Transportation Screening Complete Complete  PCP or Specialist Appt within 3-5 Days Complete   Medication Review (RN CM)  Complete  HRI or Home Care Consult Complete   Social Work Consult for [place] Planning/Counseling Complete   Palliative Care Screening Not Applicable   Medication Review Press photographer) Referral to Pharmacy

## 2021-10-23 NOTE — Progress Notes (Signed)
Physical Therapy Treatment Patient Details Name: Laura David MRN: [id number] DOB: 1934/05/08 Today's Date: 10/23/2021   History of Present Illness Pt is an 87 y/o F admitted on 10/18/21 after being brought in by her family 2/2 confusion worse than baseline, aggressive & combative behaviors, recurrent falls & not eating/drinking well. Pt is being treated for dehydration, recurrent falls & progressive dementia with recurrent behavioral issues related to paranoia & aggression. PMH: HTN, dyslipidemia, PAF on warfarin, breast CA, SDH, COPD/asthma, recurrent falls, dementa, R rotator cuff tear & arthritis    PT Comments    Pt received upright in recliner. Per RN pt requesting to return to bed. RN also reporting positive orthostatics earlier this date. Deferred gait today due to pt already being high falls risk since dizziness is worsened today with soft BP. Pt assisted with transfer from chair to bed. Pt remains needing significant multi modal cuing for hand placement, RW proximity and sequencing as pt remains far away from [place] despite all cues and education.  Pt returned to supine in bed performing LE therex for strengthening requiring demo and mod multimodal cuing for form/technique. Pt repositioned with all needs in reach. D/c recs remain appropriate.     Recommendations for follow up therapy are one component of a multi-disciplinary discharge planning process, led by the attending physician.  Recommendations may be updated based on patient status, additional functional criteria and insurance authorization.  Follow Up Recommendations  Skilled nursing-short term rehab (<3 hours/day) Can patient physically be transported by private vehicle: Yes   Assistance Recommended at Discharge    Patient can return home with the following A little help with walking and/or transfers;A little help with bathing/dressing/bathroom;Assist for transportation;Help with stairs or ramp for entrance;Assistance with  cooking/housework;Direct supervision/assist for financial management;Direct supervision/assist for medications management;Assistance with feeding   Equipment Recommendations  None recommended by PT    Recommendations for Other Services       Precautions / Restrictions Precautions Precautions: Fall Restrictions Weight Bearing Restrictions: No     Mobility  Bed Mobility Overal bed mobility: Needs Assistance Bed Mobility: Sit to Supine       Sit to supine: Min assist     Patient Response: Cooperative  Transfers Overall transfer level: Needs assistance Equipment used: Rolling walker (2 wheels) Transfers: Sit to/from Stand, Bed to chair/wheelchair/BSC Sit to Stand: Min guard   Step pivot transfers: Min guard            Ambulation/Gait               General Gait Details: gait deferred due to positive orthostatics and high falls risk despite soft BP   Stairs             Wheelchair Mobility    Modified Rankin (Stroke Patients Only)       Balance Overall balance assessment: History of Falls, Needs assistance Sitting-balance support: Feet supported Sitting balance-Leahy Scale: Good     Standing balance support: During functional activity, Bilateral upper extremity supported, Reliant on assistive device for balance Standing balance-Leahy Scale: Fair                              Cognition Arousal/Alertness: Awake/alert Behavior During Therapy: WFL for tasks assessed/performed Overall Cognitive Status: History of cognitive impairments - at baseline  Exercises General Exercises - Lower Extremity Short Arc Quad: AROM, Strengthening, Both, 10 reps, Supine Long Arc Quad: AROM, Strengthening, Both, 10 reps, Seated Hip ABduction/ADduction: AROM, Strengthening, Both, 10 reps, Supine Straight Leg Raises: AROM, Strengthening, Both, 10 reps, Supine    General Comments         Pertinent Vitals/Pain Pain Assessment Pain Assessment: No/denies pain    Home Living                          Prior Function            PT Goals (current goals can now be found in the care plan section) Acute Rehab PT Goals Patient Stated Goal: none stated    Frequency    Min 2X/week      PT Plan Current plan remains appropriate    Co-evaluation              AM-PAC PT "6 Clicks" Mobility   Outcome Measure  Help needed turning from your back to your side while in a flat bed without using bedrails?: A Little Help needed moving from lying on your back to sitting on the side of a flat bed without using bedrails?: A Lot Help needed moving to and from a bed to a chair (including a wheelchair)?: A Lot Help needed standing up from a chair using your arms (e.g., wheelchair or bedside chair)?: A Lot Help needed to walk in hospital room?: A Lot Help needed climbing 3-5 steps with a railing? : A Lot 6 Click Score: 13    End of Session Equipment Utilized During Treatment: Gait belt;Oxygen Activity Tolerance: Patient tolerated treatment well Patient left: in chair;with call bell/phone within reach;with chair alarm set Nurse Communication: Mobility status PT Visit Diagnosis: History of falling (Z91.81);Muscle weakness (generalized) (M62.81);Other abnormalities of gait and mobility (R26.89);Unsteadiness on feet (R26.81)     Time: 1594-5859 PT Time Calculation (min) (ACUTE ONLY): 23 min  Charges:  $Therapeutic Exercise: 23-37 mins                     Dakwan Pridgen M. Fairly IV, PT, DPT Physical Therapist- [facility]  10/23/2021, 3:29 PM

## 2021-10-23 NOTE — Plan of Care (Signed)
  Problem: Education: Goal: Knowledge of General Education information will improve Description: Including pain rating scale, medication(s)/side effects and non-pharmacologic comfort measures Outcome: Progressing   Problem: Health Behavior/Discharge Planning: Goal: Ability to manage health-related needs will improve Outcome: Progressing   Problem: Clinical Measurements: Goal: Will remain free from infection Outcome: Progressing Goal: Diagnostic test results will improve Outcome: Progressing   Problem: Nutrition: Goal: Adequate nutrition will be maintained Outcome: Progressing   Problem: Coping: Goal: Level of anxiety will decrease Outcome: Progressing   Problem: Safety: Goal: Ability to remain free from injury will improve Outcome: Progressing   Problem: Skin Integrity: Goal: Risk for impaired skin integrity will decrease Outcome: Progressing

## 2021-10-24 DIAGNOSIS — E860 Dehydration: Secondary | ICD-10-CM | POA: Diagnosis not present

## 2021-10-24 NOTE — TOC Progression Note (Signed)
Transition of Care [facility]) - Progression Note    Patient Details  Name: JOAQUINA NISSEN MRN: [id number] Date of Birth: [birth year]  Transition of Care [facility] CM/SW [place], RN Phone Number: 10/24/2021, 10:18 AM  Clinical Narrative:     Have sent thru entire network for bed request, no bed offers yet    Barriers to Discharge: No SNF bed, Insurance Authorization  Expected Discharge Plan and Services                                                 Social Determinants of Health (SDOH) Interventions Food Insecurity Interventions: Intervention Not Indicated Housing Interventions: Intervention Not Indicated Transportation Interventions: Intervention Not Indicated Utilities Interventions: Intervention Not Indicated  Readmission Risk Interventions    10/21/2021    9:01 AM 01/13/2020    4:33 PM  Readmission Risk Prevention Plan  Transportation Screening Complete Complete  PCP or Specialist Appt within 3-5 Days Complete   Medication Review (RN CM)  Complete  HRI or Home Care Consult Complete   Social Work Consult for [place] Planning/Counseling Complete   Palliative Care Screening Not Applicable   Medication Review Press photographer) Referral to Pharmacy

## 2021-10-24 NOTE — Plan of Care (Signed)
  Problem: Pain Managment: Goal: General experience of comfort will improve Outcome: Adequate for Discharge   Problem: Education: Goal: Knowledge of General Education information will improve Description: Including pain rating scale, medication(s)/side effects and non-pharmacologic comfort measures Outcome: Progressing   Problem: Health Behavior/Discharge Planning: Goal: Ability to manage health-related needs will improve Outcome: Progressing   Problem: Clinical Measurements: Goal: Will remain free from infection Outcome: Progressing Goal: Diagnostic test results will improve Outcome: Progressing   Problem: Activity: Goal: Risk for activity intolerance will decrease Outcome: Progressing   Problem: Nutrition: Goal: Adequate nutrition will be maintained Outcome: Progressing   Problem: Coping: Goal: Level of anxiety will decrease Outcome: Progressing   Problem: Safety: Goal: Ability to remain free from injury will improve Outcome: Progressing   Problem: Skin Integrity: Goal: Risk for impaired skin integrity will decrease Outcome: Progressing

## 2021-10-24 NOTE — Progress Notes (Signed)
Physical Therapy Treatment Patient Details Name: Laura David MRN: [id number] DOB: 05-Jun-1934 Today's Date: 10/24/2021   History of Present Illness Pt is an 87 y/o F admitted on 10/18/21 after being brought in by her family 2/2 confusion worse than baseline, aggressive & combative behaviors, recurrent falls & not eating/drinking well. Pt is being treated for dehydration, recurrent falls & progressive dementia with recurrent behavioral issues related to paranoia & aggression. PMH: HTN, dyslipidemia, PAF on warfarin, breast CA, SDH, COPD/asthma, recurrent falls, dementa, R rotator cuff tear & arthritis    PT Comments    Pt received in bedside recliner. Good tolerance for seated B LE strengthening exercises prior to mobility. Pt able to stand from chair with [employer]. Gait training with RW 51f with Min Guard assist and occasional assist to direct RW around objects and correct body position to "inside" of RW for support. Significant shuffling noted during gait. Pt returned to chair, all needs met, call bell in reach, chair alarm on. Continue to recommend short term rehab once medically cleared for d/c.    Recommendations for follow up therapy are one component of a multi-disciplinary discharge planning process, led by the attending physician.  Recommendations may be updated based on patient status, additional functional criteria and insurance authorization.  Follow Up Recommendations  Skilled nursing-short term rehab (<3 hours/day) Can patient physically be transported by private vehicle: Yes   Assistance Recommended at Discharge Frequent or constant Supervision/Assistance  Patient can return home with the following A little help with walking and/or transfers;A little help with bathing/dressing/bathroom;Assist for transportation;Help with stairs or ramp for entrance;Assistance with cooking/housework;Direct supervision/assist for financial management;Direct supervision/assist for medications  management;Assistance with feeding   Equipment Recommendations  None recommended by PT    Recommendations for Other Services       Precautions / Restrictions Precautions Precautions: Fall Restrictions Weight Bearing Restrictions: No     Mobility  Bed Mobility               General bed mobility comments: Patient in recliner upon arrival    Transfers Overall transfer level: Needs assistance Equipment used: Rolling walker (2 wheels) Transfers: Sit to/from Stand, Bed to chair/wheelchair/BSC Sit to Stand: Min guard           General transfer comment: multimodal for hand placement and management of RW    Ambulation/Gait Ambulation/Gait assistance: Min guard Gait Distance (Feet): 85 Feet Assistive device: Rolling walker (2 wheels) Gait Pattern/deviations: Step-to pattern, Decreased step length - right, Decreased step length - left, Decreased dorsiflexion - right, Decreased dorsiflexion - left, Decreased stride length, Shuffle Gait velocity: decreased     General Gait Details:  (Very wide BOS with shuffling baby steps and very slow cadence)   Stairs             Wheelchair Mobility    Modified Rankin (Stroke Patients Only)       Balance Overall balance assessment: History of Falls, Needs assistance Sitting-balance support: Feet supported Sitting balance-Leahy Scale: Good Sitting balance - Comments: able to perform pericare   Standing balance support: During functional activity, Bilateral upper extremity supported, Reliant on assistive device for balance Standing balance-Leahy Scale: Fair Standing balance comment: able to don/doff underwear, CGA                            Cognition Arousal/Alertness: Awake/alert Behavior During Therapy: WFL for tasks assessed/performed Overall Cognitive Status: History of cognitive impairments - at  baseline                                 General Comments: Difficulty with good carryover  after multimodal cuing for improved safety awareness. Very pleasant and cooperative.        Exercises General Exercises - Lower Extremity Ankle Circles/Pumps: AROM, Strengthening, Both, 10 reps, Supine Long Arc Quad: AROM, Strengthening, Both, 10 reps, Seated Hip Flexion/Marching: AROM, Strengthening, Both, 10 reps, Seated    General Comments        Pertinent Vitals/Pain Pain Assessment Pain Assessment: No/denies pain    Home Living                          Prior Function            PT Goals (current goals can now be found in the care plan section) Acute Rehab PT Goals Patient Stated Goal: none stated Progress towards PT goals: Progressing toward goals    Frequency    Min 2X/week      PT Plan Current plan remains appropriate    Co-evaluation              AM-PAC PT "6 Clicks" Mobility   Outcome Measure  Help needed turning from your back to your side while in a flat bed without using bedrails?: A Little Help needed moving from lying on your back to sitting on the side of a flat bed without using bedrails?: A Lot Help needed moving to and from a bed to a chair (including a wheelchair)?: A Lot Help needed standing up from a chair using your arms (e.g., wheelchair or bedside chair)?: A Lot Help needed to walk in hospital room?: A Lot Help needed climbing 3-5 steps with a railing? : A Lot 6 Click Score: 13    End of Session Equipment Utilized During Treatment: Gait belt Activity Tolerance: Patient tolerated treatment well Patient left: in chair;with call bell/phone within reach;with chair alarm set Nurse Communication: Mobility status PT Visit Diagnosis: History of falling (Z91.81);Muscle weakness (generalized) (M62.81);Other abnormalities of gait and mobility (R26.89);Unsteadiness on feet (R26.81)     Time: 7005-2591 PT Time Calculation (min) (ACUTE ONLY): 33 min  Charges:  $Gait Training: 8-22 mins $Therapeutic Exercise: 8-22 mins                     Mikel Cella, PTA    Josie Dixon 10/24/2021, 12:21 PM

## 2021-10-24 NOTE — Progress Notes (Signed)
[facility] [place] NAME: Laura David    MR#:  [id number]  DATE OF BIRTH:  1934-04-01  SUBJECTIVE:   No family at bedside. Patient is quite pleasant and cooperative.  VITALS:  Blood pressure 137/73, pulse 72, temperature 97.9 F (36.6 C), resp. rate 18, height 5' (1.524 m), weight 45.8 kg, SpO2 100 %.  PHYSICAL EXAMINATION:   GENERAL:  87 y.o.-year-old patient lying in the bed with no acute distress.  LUNGS: Normal breath sounds bilaterally, no wheezing CARDIOVASCULAR: S1, S2 normal. No murmurs,   ABDOMEN: Soft, nontender, nondistended. Bowel sounds present.  EXTREMITIES: No  edema b/l.    NEUROLOGIC: nonfocal  patient is alert  SKIN: No obvious rash, lesion, or ulcer.   LABORATORY PANEL:  CBC Recent Labs  Lab 10/21/21 0458  WBC 14.7*  HGB 15.5*  HCT 46.8*  PLT 262     Chemistries  Recent Labs  Lab 10/17/21 2342 10/19/21 0714 10/21/21 0458  NA 137   < > 140  K 4.2   < > 4.3  CL 104   < > 105  CO2 24   < > 26  GLUCOSE 100*   < > 99  BUN 23   < > 23  CREATININE 0.73   < > 0.80  CALCIUM 9.9   < > 9.6  MG  --    < > 2.0  AST 39  --   --   ALT 34  --   --   ALKPHOS 57  --   --   BILITOT 1.0  --   --    < > = values in this interval not displayed.     Assessment and Plan  AHNA KONKLE is a 87 y.o. female with medical history significant of hypertension, PAF on warfarin, history of breast cancer, history of prior subdural hematoma, COPD/asthma, recurrent falls, known dementia, right shoulder pain secondary to rotator cuff tear and glenohumeral arthritis.   She was brought to the ER by her family due to behavioral changes, confusion worse than baseline as well as aggressive, paranoia and combative behaviors at home, and excessive sleep.  She has also had worsening weakness and recurrent falls.   Was recently hospitalized and discharged on 10/6 after being treated for UTI as well as right shoulder pain.      Recurrent falls --2/2 worsening weakness and not using her wheelchair. --PT/OT rec SNF rehab   Progressive dementia with recurrent behavioral issues related to paranoia and aggression --mental status much improved the day after presentation.  Has been calm and cooperative.    Chronic shoulder pain in context of rotator cuff tear and glenohumeral arthritis Patient reports Tylenol ineffective --cont lidocaine patch (new) --cont Oxy IR 2.5 mg every 8 hours PRN (new)   Hypertension --cont amlodipine and Lopressor   PAF Coumadin per pharmacy Continue beta-blocker   Dyslipidemia Continue Lipitor     DVT prophylaxis: NW:[id number] Code Status: DNR  Family Communication:  Level of care: Med-Surg Dispo:   The patient is from: home Anticipated d/c is to: SNF rehab Anticipated d/c date is: whenever bed available    TOTAL TIME TAKING CARE OF THIS PATIENT: 25 minutes.  >50% time spent on counselling and coordination of care  Note: This dictation was prepared with Dragon dictation along with smaller phrase technology. Any transcriptional errors that result from this process are unintentional.  Fritzi Mandes M.D    [facility]   CC:  Primary care physician; [name], MD

## 2021-10-25 DIAGNOSIS — E860 Dehydration: Secondary | ICD-10-CM | POA: Diagnosis not present

## 2021-10-25 NOTE — TOC Progression Note (Signed)
Transition of Care [facility]) - Progression Note    Patient Details  Name: Laura David MRN: [id number] Date of Birth: 09/13/1934  [facility] CM/SW [place], RN Phone Number: 10/25/2021, 11:42 AM  Clinical Narrative:    Reached out to facilities asking to review for bed offer, no offers yet     Barriers to Discharge: No SNF bed, Insurance Authorization  Expected Discharge Plan and Services                                                 Social Determinants of Health (SDOH) Interventions Food Insecurity Interventions: Intervention Not Indicated Housing Interventions: Intervention Not Indicated Transportation Interventions: Intervention Not Indicated Utilities Interventions: Intervention Not Indicated  Readmission Risk Interventions    10/21/2021    9:01 AM 01/13/2020    4:33 PM  Readmission Risk Prevention Plan  Transportation Screening Complete Complete  PCP or Specialist Appt within 3-5 Days Complete   Medication Review (RN CM)  Complete  HRI or Home Care Consult Complete   Social Work Consult for [place] Planning/Counseling Complete   Palliative Care Screening Not Applicable   Medication Review Press photographer) Referral to Pharmacy

## 2021-10-25 NOTE — Care Management Important Message (Signed)
Important Message  Patient Details  Name: Laura David MRN: [id number] Date of Birth: 05-08-1934   Medicare Important Message Given:  Yes     Conception Oms, RN 10/25/2021, 12:11 PM

## 2021-10-25 NOTE — Progress Notes (Signed)
  PROGRESS NOTE    [name]  Laura David:[id number] DOB: 1934-12-03 DOA: 10/18/2021 PCP: [name], MD  143A/143A-AA  LOS: 7 days   Brief hospital course:   Assessment & Plan: Laura David is a 87 y.o. female with medical history significant of hypertension, PAF on warfarin, history of breast cancer, history of prior subdural hematoma, COPD/asthma, recurrent falls, known dementia, right shoulder pain secondary to rotator cuff tear and glenohumeral arthritis.   She was brought to the ER by her family due to behavioral changes, confusion worse than baseline as well as aggressive, paranoia and combative behaviors at home, and excessive sleep.  She has also had worsening weakness and recurrent falls.   Was recently hospitalized and discharged on 10/6 after being treated for UTI as well as right shoulder pain.     Recurrent falls --2/2 worsening weakness and not using her wheelchair. --PT/OT rec SNF rehab   Progressive dementia with recurrent behavioral issues related to paranoia and aggression --mental status much improved the day after presentation.  Has been calm and cooperative.    Chronic shoulder pain in context of rotator cuff tear and glenohumeral arthritis Patient reports Tylenol ineffective --cont lidocaine patch (new) --cont Oxy IR 2.5 mg every 8 hours PRN (new)   Hypertension --cont amlodipine and Lopressor   PAF Coumadin per pharmacy Continue beta-blocker   Dyslipidemia Continue Lipitor   DVT prophylaxis: MB:TDHRCBUL Code Status: DNR  Family Communication:  Level of care: Med-Surg Dispo:   The patient is from: home Anticipated d/c is to: SNF rehab Anticipated d/c date is: whenever bed available   Subjective and Interval History:  No new events or complaints.   Objective: Vitals:   10/25/21 0551 10/25/21 0553 10/25/21 0829 10/25/21 1635  BP: 127/77 113/88 (!) 143/84 119/76  Pulse: 74 95 78 84  Resp:   17 16  Temp:   97.8 F (36.6 C) 98 F  (36.7 C)  TempSrc:      SpO2:   99% 100%  Weight:      Height:        Intake/Output Summary (Last 24 hours) at 10/25/2021 1705 Last data filed at 10/25/2021 1021 Gross per 24 hour  Intake 60 ml  Output 500 ml  Net -440 ml   Filed Weights   10/17/21 2255 10/19/21 2059  Weight: 48.1 kg 45.8 kg    Examination:   Constitutional: NAD, sitting in recliner CV: No cyanosis.   RESP: normal respiratory effort, on RA   Data Reviewed: I have personally reviewed labs and imaging studies  Time spent: 25 minutes  Enzo Bi, MD [facility] If 7PM-7AM, please contact night-coverage 10/25/2021, 5:05 PM

## 2021-10-26 DIAGNOSIS — E860 Dehydration: Secondary | ICD-10-CM | POA: Diagnosis not present

## 2021-10-26 LAB — CBC
HCT: 43.1 % (ref 36.0–46.0)
Hemoglobin: 14 g/dL (ref 12.0–15.0)
MCH: 30.4 pg (ref 26.0–34.0)
MCHC: 32.5 g/dL (ref 30.0–36.0)
MCV: 93.7 fL (ref 80.0–100.0)
Platelets: 251 10*3/uL (ref 150–400)
RBC: 4.6 MIL/uL (ref 3.87–5.11)
RDW: 13.7 % (ref 11.5–15.5)
WBC: 8.8 10*3/uL (ref 4.0–10.5)
nRBC: 0 % (ref 0.0–0.2)

## 2021-10-26 LAB — PROTIME-INR
INR: 3.1 — ABNORMAL HIGH (ref 0.8–1.2)
Prothrombin Time: 31.4 seconds — ABNORMAL HIGH (ref 11.4–15.2)

## 2021-10-26 NOTE — Plan of Care (Signed)

## 2021-10-26 NOTE — Plan of Care (Signed)

## 2021-10-26 NOTE — Progress Notes (Signed)
  PROGRESS NOTE    MIIA BLANKS  MMN:[id number] DOB: 1934/07/15 DOA: 10/18/2021 PCP: [name], MD  143A/143A-AA  LOS: 8 days   Brief hospital course:   Assessment & Plan: Laura David is a 87 y.o. female with medical history significant of hypertension, PAF on warfarin, history of breast cancer, history of prior subdural hematoma, COPD/asthma, recurrent falls, known dementia, right shoulder pain secondary to rotator cuff tear and glenohumeral arthritis.   She was brought to the ER by her family due to behavioral changes, confusion worse than baseline as well as aggressive, paranoia and combative behaviors at home, and excessive sleep.  She has also had worsening weakness and recurrent falls.   Was recently hospitalized and discharged on 10/6 after being treated for UTI as well as right shoulder pain.     Recurrent falls --2/2 worsening weakness and not using her wheelchair. --PT/OT rec SNF rehab   Progressive dementia with recurrent behavioral issues related to paranoia and aggression --mental status much improved the day after presentation.  Has been calm and cooperative.    Chronic shoulder pain in context of rotator cuff tear and glenohumeral arthritis Patient reports Tylenol ineffective --cont lidocaine patch (new) --d/c today Oxy IR 2.5 mg every 8 hours PRN (new)   Hypertension --cont amlodipine and Lopressor   PAF Coumadin per pharmacy Continue beta-blocker   Dyslipidemia Continue Lipitor   DVT prophylaxis: XU:XYBFXOVA Code Status: DNR  Family Communication:  Level of care: Med-Surg Dispo:   The patient is from: home Anticipated d/c is to: SNF rehab Anticipated d/c date is: whenever bed available   Subjective and Interval History:  Pt seemed unsure about how she was doing and plan going forward.     Objective: Vitals:   10/25/21 2223 10/25/21 2343 10/26/21 0819 10/26/21 1712  BP: (!) 160/88 (!) 162/96 (!) 145/92 (!) 143/83  Pulse: 87 80 76  96  Resp:  '16 16 16  '$ Temp:  97.9 F (36.6 C) 98.1 F (36.7 C) 98.2 F (36.8 C)  TempSrc:      SpO2:  98% 100% 100%  Weight:      Height:        Intake/Output Summary (Last 24 hours) at 10/26/2021 2010 Last data filed at 10/26/2021 1903 Gross per 24 hour  Intake 0 ml  Output 450 ml  Net -450 ml   Filed Weights   10/17/21 2255 10/19/21 2059  Weight: 48.1 kg 45.8 kg    Examination:   Constitutional: NAD, alert, sitting in recliner HEENT: conjunctivae and lids normal, EOMI CV: No cyanosis.   RESP: normal respiratory effort, on RA Neuro: II - XII grossly intact.     Data Reviewed: I have personally reviewed labs and imaging studies  Time spent: 25 minutes  Enzo Bi, MD [facility] If 7PM-7AM, please contact night-coverage 10/26/2021, 8:10 PM

## 2021-10-26 NOTE — Progress Notes (Signed)
ANTICOAGULATION CONSULT NOTE -   Pharmacy Consult for warfarin Indication: atrial fibrillation  Allergies  Allergen Reactions   Flagyl [Metronidazole] Nausea Only   Pacerone [Amiodarone]     Eye deposits   Ramipril Cough    Patient Measurements: Height: 5' (152.4 cm) Weight: 45.8 kg (100 lb 15.5 oz) IBW/kg (Calculated) : 45.5  Vital Signs: Temp: 98.1 F (36.7 C) (10/21 0819) BP: 145/92 (10/21 0819) Pulse Rate: 76 (10/21 0819)  Labs: Recent Labs    10/26/21 0437  HGB 14.0  HCT 43.1  PLT 251  LABPROT 31.4*  INR 3.1*     Estimated Creatinine Clearance: 36.9 mL/min (by C-G formula based on SCr of 0.8 mg/dL).   Medical History: Past Medical History:  Diagnosis Date   Asthma    Breast cancer ([place]) 2010   RIGHT   Cancer ([place])    basel cell, s/p MOH'S/DR. 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 Regarding paroxysmal Afib, patient's CHADSVASc is 5, HR stable and WNL, and INR is currently at goal 2-3.  Drug-drug interactions: None significant  Date INR Warfarin Dose Comment 10/13 2.1 '1mg'$  10/14 1.8 '2mg'$    INR slightly subtherapeutic; increase dose x1 10/15 2.3 '2mg'$  10/16 2.7 1 mg 10/17 2.6 '1mg'$  10/18 2.4  10/21 3.1   Goal of Therapy:  INR 2-3   Plan:  INR slightly supratherapeutic, will continue PTA dosing of 1 mg Mon,Tues, Thu, Fri,Sat and 2 mg Wed, Sun Follow INR daily for now until therapeutic again  Pearla Dubonnet, PharmD Clinical  Pharmacist 10/26/2021 11:31 AM

## 2021-10-27 DIAGNOSIS — E860 Dehydration: Secondary | ICD-10-CM | POA: Diagnosis not present

## 2021-10-27 LAB — PROTIME-INR
INR: 3.6 — ABNORMAL HIGH (ref 0.8–1.2)
Prothrombin Time: 35.3 seconds — ABNORMAL HIGH (ref 11.4–15.2)

## 2021-10-27 MED ORDER — POLYETHYLENE GLYCOL 3350 17 G PO PACK
17.0000 g | PACK | Freq: Two times a day (BID) | ORAL | Status: DC
  Administered 2021-10-27 – 2021-10-28 (×3): 17 g via ORAL
  Filled 2021-10-27 (×8): qty 1

## 2021-10-27 MED ORDER — SENNOSIDES-DOCUSATE SODIUM 8.6-50 MG PO TABS
1.0000 | ORAL_TABLET | Freq: Two times a day (BID) | ORAL | Status: DC
  Administered 2021-10-27 – 2021-10-31 (×7): 1 via ORAL
  Filled 2021-10-27 (×9): qty 1

## 2021-10-27 NOTE — Plan of Care (Signed)

## 2021-10-27 NOTE — Progress Notes (Signed)
[facility] [place] [place] NAME: Laura David    MR#:  [id number]  DATE OF BIRTH:  04-14-1934  SUBJECTIVE:   No family at bedside. Patient is quite pleasant and cooperative.  VITALS:  Blood pressure 126/79, pulse 69, temperature (!) 97.5 F (36.4 C), temperature source Oral, resp. rate 16, height 5' (1.524 m), weight 45.8 kg, SpO2 100 %.  PHYSICAL EXAMINATION:   GENERAL:  87 y.o.-year-old patient lying in the bed with no acute distress.  LUNGS: Normal breath sounds bilaterally, no wheezing CARDIOVASCULAR: S1, S2 normal. No murmurs,   ABDOMEN: Soft, nontender, nondistended. Bowel sounds present.  EXTREMITIES: No  edema b/l.    NEUROLOGIC: nonfocal  patient is alert  SKIN: No obvious rash, lesion, or ulcer.   LABORATORY PANEL:  CBC Recent Labs  Lab 10/26/21 0437  WBC 8.8  HGB 14.0  HCT 43.1  PLT 251     Chemistries  Recent Labs  Lab 10/21/21 0458  NA 140  K 4.3  CL 105  CO2 26  GLUCOSE 99  BUN 23  CREATININE 0.80  CALCIUM 9.6  MG 2.0     Assessment and Plan  Laura David is a 87 y.o. female with medical history significant of hypertension, PAF on warfarin, history of breast cancer, history of prior subdural hematoma, COPD/asthma, recurrent falls, known dementia, right shoulder pain secondary to rotator cuff tear and glenohumeral arthritis.   She was brought to the ER by her family due to behavioral changes, confusion worse than baseline as well as aggressive, paranoia and combative behaviors at home, and excessive sleep.  She has also had worsening weakness and recurrent falls.   Was recently hospitalized and discharged on 10/6 after being treated for UTI as well as right shoulder pain.     Recurrent falls --2/2 worsening weakness and not using her wheelchair. --PT/OT rec SNF rehab   Progressive dementia with recurrent behavioral issues related to paranoia and aggression --mental status much improved the day after  presentation.  Has been calm and cooperative.    Chronic shoulder pain in context of rotator cuff tear and glenohumeral arthritis Patient reports Tylenol ineffective --cont lidocaine patch (new)   Hypertension --cont amlodipine and Lopressor   PAF Coumadin per pharmacy Continue beta-blocker   Dyslipidemia Continue Lipitor     DVT prophylaxis: MV:[id number] Code Status: DNR  Family Communication:  Level of care: Med-Surg Dispo:   The patient is from: home Anticipated d/c is to: SNF rehab Anticipated d/c date is: whenever bed available    TOTAL TIME TAKING CARE OF THIS PATIENT: 25 minutes.  >50% time spent on counselling and coordination of care  Note: This dictation was prepared with Dragon dictation along with smaller phrase technology. Any transcriptional errors that result from this process are unintentional.  Fritzi Mandes M.D    [facility]   CC: Primary care physician; [name], MD

## 2021-10-27 NOTE — Progress Notes (Signed)
ANTICOAGULATION CONSULT NOTE -   Pharmacy Consult for warfarin Indication: atrial fibrillation  Allergies  Allergen Reactions   Flagyl [Metronidazole] Nausea Only   Pacerone [Amiodarone]     Eye deposits   Ramipril Cough    Patient Measurements: Height: 5' (152.4 cm) Weight: 45.8 kg (100 lb 15.5 oz) IBW/kg (Calculated) : 45.5  Vital Signs: Temp: 97.5 F (36.4 C) (10/22 1002) Temp Source: Oral (10/22 1002) BP: 126/79 (10/22 1002) Pulse Rate: 69 (10/22 1002)  Labs: Recent Labs    10/26/21 0437 10/27/21 0531  HGB 14.0  --   HCT 43.1  --   PLT 251  --   LABPROT 31.4* 35.3*  INR 3.1* 3.6*     Estimated Creatinine Clearance: 36.9 mL/min (by C-G formula based on SCr of 0.8 mg/dL).   Medical History: Past Medical History:  Diagnosis Date   Asthma    Breast cancer ([place]) 2010   RIGHT   Cancer ([place])    basel cell, s/p MOH'S/DR. 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 Regarding paroxysmal Afib, patient's CHADSVASc is 5, HR stable and WNL, and INR is currently at goal 2-3.  Drug-drug interactions: None significant  Date INR Warfarin Dose Comment 10/13 2.1 '1mg'$  10/14 1.8 '2mg'$    INR slightly subtherapeutic; increase dose x1 10/15 2.3 '2mg'$  10/16 2.7 1 mg 10/17 2.6 '1mg'$  10/18 2.4 '1mg'$  10/21 3.1 '1mg'$  10/22  3.6   Goal of Therapy:  INR 2-3   Plan:  INR supratherapeutic. Will hold dose for today Follow INR every 48-72 hours per request of attending  physician  Pearla Dubonnet, PharmD Clinical Pharmacist 10/27/2021 10:35 AM

## 2021-10-27 NOTE — Progress Notes (Signed)
Physical Therapy Treatment Patient Details Name: MONTANNA MCBAIN MRN: [id number] DOB: [birth year] Today's Date: 10/27/2021   History of Present Illness Pt is an 87 y/o F admitted on 10/18/21 after being brought in by her family 2/2 confusion worse than baseline, aggressive & combative behaviors, recurrent falls & not eating/drinking well. Pt is being treated for dehydration, recurrent falls & progressive dementia with recurrent behavioral issues related to paranoia & aggression. PMH: HTN, dyslipidemia, PAF on warfarin, breast CA, SDH, COPD/asthma, recurrent falls, dementa, R rotator cuff tear & arthritis    PT Comments    In recliner.  Walks 150' with RW and min guard and overall poor safety awareness and walker position.  Cues to try to correct but poor carryover.  She keeps walker too far out in front.  SNF remains appropraite for discharge.  Pt unsafe to walk unattended.   Recommendations for follow up therapy are one component of a multi-disciplinary discharge planning process, led by the attending physician.  Recommendations may be updated based on patient status, additional functional criteria and insurance authorization.  Follow Up Recommendations  Skilled nursing-short term rehab (<3 hours/day)     Assistance Recommended at Discharge Frequent or constant Supervision/Assistance  Patient can return home with the following A little help with walking and/or transfers;A little help with bathing/dressing/bathroom;Assist for transportation;Help with stairs or ramp for entrance;Assistance with cooking/housework;Direct supervision/assist for financial management;Direct supervision/assist for medications management;Assistance with feeding   Equipment Recommendations       Recommendations for Other Services       Precautions / Restrictions Precautions Precautions: Fall Restrictions Weight Bearing Restrictions: No     Mobility  Bed Mobility               General bed mobility  comments: Patient in recliner upon arrival    Transfers Overall transfer level: Needs assistance Equipment used: Rolling walker (2 wheels) Transfers: Sit to/from Stand Sit to Stand: Min guard                Ambulation/Gait Ambulation/Gait assistance: Min guard, Min assist Gait Distance (Feet): 150 Feet Assistive device: Rolling walker (2 wheels) Gait Pattern/deviations: Step-to pattern, Decreased step length - right, Decreased step length - left, Decreased dorsiflexion - right, Decreased dorsiflexion - left, Decreased stride length, Shuffle Gait velocity: decreased     General Gait Details: generally poor safety but no LOB or buckles   Stairs             Wheelchair Mobility    Modified Rankin (Stroke Patients Only)       Balance Overall balance assessment: History of Falls, Needs assistance Sitting-balance support: Feet supported Sitting balance-Leahy Scale: Good     Standing balance support: During functional activity, Bilateral upper extremity supported, Reliant on assistive device for balance Standing balance-Leahy Scale: Fair                              Cognition Arousal/Alertness: Awake/alert Behavior During Therapy: WFL for tasks assessed/performed Overall Cognitive Status: History of cognitive impairments - at baseline                                 General Comments: poor safety        Exercises      General Comments        Pertinent Vitals/Pain Pain Assessment Pain Assessment: No/denies pain  Home Living                          Prior Function            PT Goals (current goals can now be found in the care plan section) Progress towards PT goals: Progressing toward goals    Frequency    Min 2X/week      PT Plan Current plan remains appropriate    Co-evaluation              AM-PAC PT "6 Clicks" Mobility   Outcome Measure  Help needed turning from your back to your  side while in a flat bed without using bedrails?: A Little Help needed moving from lying on your back to sitting on the side of a flat bed without using bedrails?: A Little Help needed moving to and from a bed to a chair (including a wheelchair)?: A Little Help needed standing up from a chair using your arms (e.g., wheelchair or bedside chair)?: A Little Help needed to walk in hospital room?: A Little Help needed climbing 3-5 steps with a railing? : A Lot 6 Click Score: 17    End of Session Equipment Utilized During Treatment: Gait belt Activity Tolerance: Patient tolerated treatment well Patient left: in chair;with call bell/phone within reach;with chair alarm set Nurse Communication: Mobility status PT Visit Diagnosis: History of falling (Z91.81);Muscle weakness (generalized) (M62.81);Other abnormalities of gait and mobility (R26.89);Unsteadiness on feet (R26.81)     Time: 1050-1109 PT Time Calculation (min) (ACUTE ONLY): 19 min  Charges:  $Gait Training: 8-22 mins                   Chesley Noon, PTA 10/27/21, 1:06 PM

## 2021-10-28 DIAGNOSIS — E860 Dehydration: Secondary | ICD-10-CM | POA: Diagnosis not present

## 2021-10-28 LAB — GLUCOSE, CAPILLARY: Glucose-Capillary: 116 mg/dL — ABNORMAL HIGH (ref 70–99)

## 2021-10-28 NOTE — TOC Progression Note (Signed)
Transition of Care [facility]) - Progression Note    Patient Details  Name: Laura David MRN: [id number] Date of Birth: 1934-05-30  [facility] CM/SW [place], RN Phone Number: 10/28/2021, 9:52 AM  Clinical Narrative:    No bed offers at this time, resent the search     Barriers to Discharge: No SNF bed, Insurance Authorization  Expected Discharge Plan and Services                                                 Social Determinants of Health (SDOH) Interventions Food Insecurity Interventions: Intervention Not Indicated Housing Interventions: Intervention Not Indicated Transportation Interventions: Intervention Not Indicated Utilities Interventions: Intervention Not Indicated  Readmission Risk Interventions    10/21/2021    9:01 AM 01/13/2020    4:33 PM  Readmission Risk Prevention Plan  Transportation Screening Complete Complete  PCP or Specialist Appt within 3-5 Days Complete   Medication Review (RN CM)  Complete  HRI or Home Care Consult Complete   Social Work Consult for [place] Planning/Counseling Complete   Palliative Care Screening Not Applicable   Medication Review Press photographer) Referral to Pharmacy

## 2021-10-28 NOTE — Progress Notes (Signed)
Occupational Therapy Treatment Patient Details Name: Laura David MRN: [id number] DOB: December 28, 1934 Today's Date: 10/28/2021   History of present illness Pt is an 87 y/o F admitted on 10/18/21 after being brought in by her family 2/2 confusion worse than baseline, aggressive & combative behaviors, recurrent falls & not eating/drinking well. Pt is being treated for dehydration, recurrent falls & progressive dementia with recurrent behavioral issues related to paranoia & aggression. PMH: HTN, dyslipidemia, PAF on warfarin, breast CA, SDH, COPD/asthma, recurrent falls, dementa, R rotator cuff tear & arthritis   OT comments  Laura David is making good progress toward her functional goals.  She was pleasant and receptive to OT treatment this date.  OT provided min guard assist for patient to complete STS transfers and ambulate with RW in room. Patient required 3-4 cues for rolling walker management and safe transfer technique.  Patient required multiple cues to follow one-step commands, but she was receptive to all treatment from OT.  Patient assisted to [facility] due to need for bowel movement.  Patient with significant difficulty passing hard stool.  Patient able to assist with perihygiene, but ultimately required max assist from OT due to constipation.  Patient assisted back to sidelying in bed, and RN informed of patient's status.  Patient required min assist for BLE management in sit > supine transfer.  [name] will continue to benefit from skilled OT services in acute setting to support functional cognition and strengthening, safety and independence in ADLs.  SNF remains most appropriate discharge recommendation due to generalized weakness and decreased safety awareness.   Recommendations for follow up therapy are one component of a multi-disciplinary discharge planning process, led by the attending physician.  Recommendations may be updated based on patient status, additional functional criteria and insurance  authorization.    Follow Up Recommendations  Skilled nursing-short term rehab (<3 hours/day)    Assistance Recommended at Discharge Frequent or constant Supervision/Assistance  Patient can return home with the following  Assistance with cooking/housework;Direct supervision/assist for medications management;Help with stairs or ramp for entrance;A little help with walking and/or transfers;A little help with bathing/dressing/bathroom   Equipment Recommendations  Other (comment) (defer to next level of care)    Recommendations for Other Services      Precautions / Restrictions Precautions Precautions: Fall Restrictions Weight Bearing Restrictions: No       Mobility Bed Mobility Overal bed mobility: Needs Assistance Bed Mobility: Sit to Supine       Sit to supine: Min assist   General bed mobility comments: Patient in recliner upon arrival, provided assist for BLE management in sit > supine Patient Response: Cooperative  Transfers Overall transfer level: Needs assistance Equipment used: Rolling walker (2 wheels) Transfers: Sit to/from Stand Sit to Stand: Min guard     Step pivot transfers: Min guard     General transfer comment: multimodal for hand placement and management of RW     Balance Overall balance assessment: History of Falls, Needs assistance Sitting-balance support: Feet supported Sitting balance-Leahy Scale: Good     Standing balance support: During functional activity, Bilateral upper extremity supported, Reliant on assistive device for balance Standing balance-Leahy Scale: Fair                             ADL either performed or assessed with clinical judgement   ADL Overall ADL's : Needs assistance/impaired  Toilet Transfer: Min guard;BSC/3in1;Rolling walker (2 wheels);Ambulation   Toileting- Clothing Manipulation and Hygiene: Maximal assistance;Sit to/from stand Toileting - Clothing Manipulation  Details (indicate cue type and reason): with use of RW     Functional mobility during ADLs: Min guard;Cueing for safety;Cueing for sequencing General ADL Comments: Pt required min guard for toilet transfer to [facility] with VC for RW mgt and hand placement    Extremity/Trunk Assessment Upper Extremity Assessment Upper Extremity Assessment: Generalized weakness RUE Deficits / Details: Pt with hx of RUE rotator cuff injury (received steroid shot). Pt reports difficulty feeding herself with dominant hand. RUE: Unable to fully assess due to immobilization   Lower Extremity Assessment Lower Extremity Assessment: Generalized weakness   Cervical / Trunk Assessment Cervical / Trunk Assessment: Kyphotic    Vision Baseline Vision/History: 1 Wears glasses Patient Visual Report: No change from baseline     Perception     Praxis      Cognition Arousal/Alertness: Awake/alert Behavior During Therapy: WFL for tasks assessed/performed Overall Cognitive Status: History of cognitive impairments - at baseline                                 General Comments: Required multiple verbal cues for safe technique, following one-step commands.  Patient pleasant and receptive to OT.        Exercises Other Exercises Other Exercises: provided education re: Fall and safety precautions, transfer technique, rolling walker management, assist for self care    Shoulder Instructions       General Comments      Pertinent Vitals/ Pain       Pain Assessment Pain Assessment: Faces Faces Pain Scale: Hurts whole lot Pain Location: pain with bowel movement Pain Descriptors / Indicators: Crying, Discomfort, Moaning, Restless, Grimacing Pain Intervention(s): Limited activity within patient's tolerance, Monitored during session (notified RN of patient's pain with bowel movement)  Home Living                                          Prior Functioning/Environment               Frequency  Min 2X/week        Progress Toward Goals  OT Goals(current goals can now be found in the care plan section)  Progress towards OT goals: Progressing toward goals  Acute Rehab OT Goals Patient Stated Goal: to feel better OT Goal Formulation: With patient/family Time For Goal Achievement: 11/01/21 Potential to Achieve Goals: Good  Plan Discharge plan remains appropriate;Frequency remains appropriate    Co-evaluation                 AM-PAC OT "6 Clicks" Daily Activity     Outcome Measure   Help from another person eating meals?: None Help from another person taking care of personal grooming?: A Little Help from another person toileting, which includes using toliet, bedpan, or urinal?: A Little Help from another person bathing (including washing, rinsing, drying)?: A Little Help from another person to put on and taking off regular upper body clothing?: A Little Help from another person to put on and taking off regular lower body clothing?: A Little 6 Click Score: 19    End of Session Equipment Utilized During Treatment: Rolling walker (2 wheels)  OT Visit Diagnosis: History of falling (Z91.81);Other abnormalities of gait  and mobility (R26.89);Muscle weakness (generalized) (M62.81)   Activity Tolerance Patient tolerated treatment well;Patient limited by pain   Patient Left in bed;with call bell/phone within reach;with bed alarm set   Nurse Communication Other (comment) (difficulty with bowel movement)        Time: 1478-2956 OT Time Calculation (min): 27 min  Charges: OT General Charges $OT Visit: 1 Visit OT Treatments $Self Care/Home Management : 23-37 mins  Jeneen Montgomery, OTR/L 10/28/21, 10:22 AM

## 2021-10-28 NOTE — Progress Notes (Signed)
Physical Therapy Treatment Patient Details Name: Laura David MRN: [id number] DOB: [birth year] Today's Date: 10/28/2021   History of Present Illness Pt is an 87 y/o F admitted on 10/18/21 after being brought in by her family 2/2 confusion worse than baseline, aggressive & combative behaviors, recurrent falls & not eating/drinking well. Pt is being treated for dehydration, recurrent falls & progressive dementia with recurrent behavioral issues related to paranoia & aggression. PMH: HTN, dyslipidemia, PAF on warfarin, breast CA, SDH, COPD/asthma, recurrent falls, dementa, R rotator cuff tear & arthritis    PT Comments    Pt in bed with generally poor positioning trying to eat lunch.  She is agreeable to getting to chair.  To EOB with min a x 1.  Stood and needs to use bathroom.  She is assisted to bathroom with RW and min/,mod a x 1 with increased assist today and overall poor quality gait.  Very short shuffling steps keeping walker far in front despite cues and assist to correct.  She does make it to bathroom to void but recliner is brought to doorway to return to next to bed to eat.  She agreed gait was poor today and stated "I have days like this".  She reports no pain,  HR and O2 are stable.   She does remain in chair and begins eating lunch without difficulty upon sitting.  Discussed with RN and updated on mobility.   Recommendations for follow up therapy are one component of a multi-disciplinary discharge planning process, led by the attending physician.  Recommendations may be updated based on patient status, additional functional criteria and insurance authorization.  Follow Up Recommendations  Skilled nursing-short term rehab (<3 hours/day)     Assistance Recommended at Discharge    Patient can return home with the following A little help with walking and/or transfers;A little help with bathing/dressing/bathroom;Assist for transportation;Help with stairs or ramp for entrance;Assistance  with cooking/housework;Direct supervision/assist for financial management;Direct supervision/assist for medications management;Assistance with feeding   Equipment Recommendations  None recommended by PT    Recommendations for Other Services       Precautions / Restrictions Precautions Precautions: Fall Restrictions Weight Bearing Restrictions: No     Mobility  Bed Mobility Overal bed mobility: Needs Assistance Bed Mobility: Sit to Supine     Supine to sit: Min assist, HOB elevated          Transfers Overall transfer level: Needs assistance Equipment used: Rolling walker (2 wheels) Transfers: Sit to/from Stand Sit to Stand: Min assist, Mod assist                Ambulation/Gait Ambulation/Gait assistance: Mod assist Gait Distance (Feet): 20 Feet Assistive device: Rolling walker (2 wheels) Gait Pattern/deviations: Step-to pattern, Decreased step length - right, Decreased step length - left, Decreased dorsiflexion - right, Decreased dorsiflexion - left, Decreased stride length, Shuffle Gait velocity: decreased     General Gait Details: generally increased difficulty with gait today.  struggles to make it to bathroom and needs recliner brought to door to get safely back by bed.   Stairs             Wheelchair Mobility    Modified Rankin (Stroke Patients Only)       Balance Overall balance assessment: History of Falls, Needs assistance Sitting-balance support: Feet supported Sitting balance-Leahy Scale: Fair     Standing balance support: During functional activity, Bilateral upper extremity supported, Reliant on assistive device for balance Standing balance-Leahy Scale: Poor  Cognition Arousal/Alertness: Awake/alert Behavior During Therapy: WFL for tasks assessed/performed Overall Cognitive Status: History of cognitive impairments - at baseline                                 General  Comments: poor safety        Exercises Other Exercises Other Exercises: to bathroom to void    General Comments        Pertinent Vitals/Pain Pain Assessment Pain Assessment: No/denies pain    Home Living                          Prior Function            PT Goals (current goals can now be found in the care plan section) Progress towards PT goals: Not progressing toward goals - comment    Frequency    Min 2X/week      PT Plan Current plan remains appropriate    Co-evaluation              AM-PAC PT "6 Clicks" Mobility   Outcome Measure  Help needed turning from your back to your side while in a flat bed without using bedrails?: A Little Help needed moving from lying on your back to sitting on the side of a flat bed without using bedrails?: A Little Help needed moving to and from a bed to a chair (including a wheelchair)?: A Little Help needed standing up from a chair using your arms (e.g., wheelchair or bedside chair)?: A Little Help needed to walk in hospital room?: A Lot Help needed climbing 3-5 steps with a railing? : Total 6 Click Score: 15    End of Session Equipment Utilized During Treatment: Gait belt Activity Tolerance: Patient tolerated treatment well Patient left: in chair;with call bell/phone within reach;with chair alarm set Nurse Communication: Mobility status;Other (comment) PT Visit Diagnosis: History of falling (Z91.81);Muscle weakness (generalized) (M62.81);Other abnormalities of gait and mobility (R26.89);Unsteadiness on feet (R26.81)     Time: 5093-2671 PT Time Calculation (min) (ACUTE ONLY): 12 min  Charges:  $Gait Training: 8-22 mins                   Chesley Noon, PTA 10/28/21, 3:32 PM

## 2021-10-28 NOTE — Care Management Important Message (Signed)
Important Message  Patient Details  Name: Laura David MRN: [id number] Date of Birth: Jul 15, 1934   Medicare Important Message Given:  Yes  Patient asleep upon time of visit.  Copy of Medicare IM left in room for reference.   [name] 10/28/2021, 12:55 PM

## 2021-10-28 NOTE — Progress Notes (Signed)
[place] for Warfarin Indication: atrial fibrillation  Patient Measurements: Height: 5' (152.4 cm) Weight: 45.8 kg (100 lb 15.5 oz) IBW/kg (Calculated) : 45.5  Labs: Recent Labs    10/26/21 0437 10/27/21 0531  HGB 14.0  --   HCT 43.1  --   PLT 251  --   LABPROT 31.4* 35.3*  INR 3.1* 3.6*     Estimated Creatinine Clearance: 36.9 mL/min (by C-G formula based on SCr of 0.8 mg/dL).   Medical History: Past Medical History:  Diagnosis Date   Asthma    Breast cancer ([place]) 2010   RIGHT   Cancer ([place])    basel cell, s/p MOH'S/DR. 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 Regarding paroxysmal Afib, patient's CHADSVASc is 5, HR stable and WNL, and INR is currently at goal 2-3.  Drug-drug interactions: None significant  Date INR Warfarin Dose 10/13 2.1  '1mg'$  10/14 1.8  '2mg'$     10/15 2.3  '2mg'$  10/16 2.7  1 mg 10/17 2.6  '1mg'$  10/18 2.4  '1mg'$  10/21 3.1  '1mg'$  10/22  3.6  Hold 10/23 N/A  Hold  Goal of Therapy:  INR 2-3   Plan:  INR last supratherapeutic at 3.6 yesterday. No INR data available for today. Continue to hold Follow INR every 48-72 hours per request of attending physician; next due tomorrow CBC at least every 3 days per protocol; monitor for s/sx of bleeding in setting of elevated INR  Benita Gutter 10/28/2021 8:01 AM

## 2021-10-28 NOTE — Plan of Care (Signed)
  Problem: Education: Goal: Knowledge of General Education information will improve Description: Including pain rating scale, medication(s)/side effects and non-pharmacologic comfort measures 10/28/2021 2350 by Fatima Blank, RN Outcome: Progressing 10/28/2021 2345 by Fatima Blank, RN Outcome: Progressing   Problem: Health Behavior/Discharge Planning: Goal: Ability to manage health-related needs will improve 10/28/2021 2350 by Fatima Blank, RN Outcome: Progressing 10/28/2021 2345 by Fatima Blank, RN Outcome: Progressing   Problem: Clinical Measurements: Goal: Ability to maintain clinical measurements within normal limits will improve 10/28/2021 2350 by Fatima Blank, RN Outcome: Progressing 10/28/2021 2345 by Fatima Blank, RN Outcome: Progressing Goal: Will remain free from infection 10/28/2021 2350 by Fatima Blank, RN Outcome: Progressing 10/28/2021 2345 by Fatima Blank, RN Outcome: Progressing Goal: Diagnostic test results will improve 10/28/2021 2350 by Fatima Blank, RN Outcome: Progressing 10/28/2021 2345 by Fatima Blank, RN Outcome: Progressing Goal: Respiratory complications will improve 10/28/2021 2350 by Fatima Blank, RN Outcome: Progressing 10/28/2021 2345 by Fatima Blank, RN Outcome: Progressing Goal: Cardiovascular complication will be avoided 10/28/2021 2350 by Fatima Blank, RN Outcome: Progressing 10/28/2021 2345 by Fatima Blank, RN Outcome: Progressing   Problem: Activity: Goal: Risk for activity intolerance will decrease 10/28/2021 2350 by Fatima Blank, RN Outcome: Progressing 10/28/2021 2345 by Fatima Blank, RN Outcome: Progressing   Problem: Nutrition: Goal: Adequate nutrition will be maintained 10/28/2021 2350 by Fatima Blank, RN Outcome: Progressing 10/28/2021 2345 by Fatima Blank,  RN Outcome: Progressing   Problem: Coping: Goal: Level of anxiety will decrease 10/28/2021 2350 by Fatima Blank, RN Outcome: Progressing 10/28/2021 2345 by Fatima Blank, RN Outcome: Progressing   Problem: Elimination: Goal: Will not experience complications related to bowel motility 10/28/2021 2350 by Fatima Blank, RN Outcome: Progressing 10/28/2021 2345 by Fatima Blank, RN Outcome: Progressing Goal: Will not experience complications related to urinary retention 10/28/2021 2350 by Fatima Blank, RN Outcome: Progressing 10/28/2021 2345 by Fatima Blank, RN Outcome: Progressing   Problem: Pain Managment: Goal: General experience of comfort will improve 10/28/2021 2350 by Fatima Blank, RN Outcome: Progressing 10/28/2021 2345 by Fatima Blank, RN Outcome: Progressing   Problem: Safety: Goal: Ability to remain free from injury will improve 10/28/2021 2350 by Fatima Blank, RN Outcome: Progressing 10/28/2021 2345 by Fatima Blank, RN Outcome: Progressing   Problem: Skin Integrity: Goal: Risk for impaired skin integrity will decrease 10/28/2021 2350 by Fatima Blank, RN Outcome: Progressing 10/28/2021 2345 by Fatima Blank, RN Outcome: Progressing

## 2021-10-28 NOTE — Progress Notes (Signed)
[facility] [place] NAME: Laura David    MR#:  [id number]  DATE OF BIRTH:  Feb 13, 1934  SUBJECTIVE:   No family at bedside. Patient is quite pleasant and cooperative.  VITALS:  Blood pressure (!) 171/83, pulse 66, temperature 98.1 F (36.7 C), resp. rate 18, height 5' (1.524 m), weight 45.8 kg, SpO2 100 %.  PHYSICAL EXAMINATION:   GENERAL:  87 y.o.-year-old patient lying in the bed with no acute distress.  LUNGS: Normal breath sounds bilaterally, no wheezing CARDIOVASCULAR: S1, S2 normal. No murmurs,   ABDOMEN: Soft, nontender, nondistended. Bowel sounds present.  EXTREMITIES: No  edema b/l.    NEUROLOGIC: nonfocal  patient is alert  SKIN: No obvious rash, lesion, or ulcer.   LABORATORY PANEL:  CBC Recent Labs  Lab 10/26/21 0437  WBC 8.8  HGB 14.0  HCT 43.1  PLT 251     Chemistries  No results for input(s): "NA", "K", "CL", "CO2", "GLUCOSE", "BUN", "CREATININE", "CALCIUM", "MG", "AST", "ALT", "ALKPHOS", "BILITOT" in the last 168 hours.  Invalid input(s): "GFRCGP"   Assessment and Plan  Laura David is a 87 y.o. female with medical history significant of hypertension, PAF on warfarin, history of breast cancer, history of prior subdural hematoma, COPD/asthma, recurrent falls, known dementia, right shoulder pain secondary to rotator cuff tear and glenohumeral arthritis.   She was brought to the ER by her family due to behavioral changes, confusion worse than baseline as well as aggressive, paranoia and combative behaviors at home, and excessive sleep.  She has also had worsening weakness and recurrent falls.   Was recently hospitalized and discharged on 10/6 after being treated for UTI as well as right shoulder pain.     Recurrent falls --2/2 worsening weakness and not using her wheelchair. --PT/OT rec SNF rehab   Progressive dementia with recurrent behavioral issues related to paranoia and aggression --mental status  much improved the day after presentation.  Has been calm and cooperative.    Chronic shoulder pain in context of rotator cuff tear and glenohumeral arthritis Patient reports Tylenol ineffective --cont lidocaine patch (new)   Hypertension --cont amlodipine and Lopressor   PAF Coumadin per pharmacy Continue beta-blocker   Dyslipidemia Continue Lipitor     DVT prophylaxis: TD:SKAJGOTL Code Status: DNR  Family Communication:  Level of care: Med-Surg Dispo:   The patient is from: home Anticipated d/c is to: SNF rehab Anticipated d/c date is: whenever bed available    TOTAL TIME TAKING CARE OF THIS PATIENT: 25 minutes.  >50% time spent on counselling and coordination of care  Note: This dictation was prepared with Dragon dictation along with smaller phrase technology. Any transcriptional errors that result from this process are unintentional.  Fritzi Mandes M.D    [facility]   CC: Primary care physician; [name], MD

## 2021-10-29 DIAGNOSIS — F0390 Unspecified dementia without behavioral disturbance: Secondary | ICD-10-CM | POA: Diagnosis not present

## 2021-10-29 LAB — CBC
HCT: 41.4 % (ref 36.0–46.0)
Hemoglobin: 13.7 g/dL (ref 12.0–15.0)
MCH: 30.6 pg (ref 26.0–34.0)
MCHC: 33.1 g/dL (ref 30.0–36.0)
MCV: 92.4 fL (ref 80.0–100.0)
Platelets: 217 10*3/uL (ref 150–400)
RBC: 4.48 MIL/uL (ref 3.87–5.11)
RDW: 13.2 % (ref 11.5–15.5)
WBC: 7.9 10*3/uL (ref 4.0–10.5)
nRBC: 0 % (ref 0.0–0.2)

## 2021-10-29 LAB — BASIC METABOLIC PANEL
Anion gap: 7 (ref 5–15)
BUN: 24 mg/dL — ABNORMAL HIGH (ref 8–23)
CO2: 27 mmol/L (ref 22–32)
Calcium: 9.3 mg/dL (ref 8.9–10.3)
Chloride: 105 mmol/L (ref 98–111)
Creatinine, Ser: 0.76 mg/dL (ref 0.44–1.00)
GFR, Estimated: >60 mL/min (ref >60)
Glucose, Bld: 88 mg/dL (ref 70–99)
Potassium: 3.7 mmol/L (ref 3.5–5.1)
Sodium: 139 mmol/L (ref 135–145)

## 2021-10-29 LAB — PROTIME-INR
INR: 1.7 — ABNORMAL HIGH (ref 0.8–1.2)
INR: 1.5 — ABNORMAL HIGH (ref 0.8–1.2)
Prothrombin Time: 20 seconds — ABNORMAL HIGH (ref 11.4–15.2)
Prothrombin Time: 17.7 seconds — ABNORMAL HIGH (ref 11.4–15.2)

## 2021-10-29 MED ORDER — WARFARIN SODIUM 1 MG PO TABS
1.0000 mg | ORAL_TABLET | Freq: Once | ORAL | Status: AC
  Administered 2021-10-29: 1 mg via ORAL
  Filled 2021-10-29: qty 1

## 2021-10-29 NOTE — Plan of Care (Signed)

## 2021-10-29 NOTE — Plan of Care (Signed)
  Problem: Education: Goal: Knowledge of General Education information will improve Description: Including pain rating scale, medication(s)/side effects and non-pharmacologic comfort measures 10/29/2021 1530 by Montel Culver, RN Outcome: Progressing 10/29/2021 1042 by Montel Culver, RN Outcome: Progressing   Problem: Health Behavior/Discharge Planning: Goal: Ability to manage health-related needs will improve 10/29/2021 1530 by Montel Culver, RN Outcome: Progressing 10/29/2021 1042 by Montel Culver, RN Outcome: Progressing   Problem: Clinical Measurements: Goal: Ability to maintain clinical measurements within normal limits will improve 10/29/2021 1530 by Montel Culver, RN Outcome: Progressing 10/29/2021 1042 by Montel Culver, RN Outcome: Progressing Goal: Will remain free from infection 10/29/2021 1530 by Montel Culver, RN Outcome: Progressing 10/29/2021 1042 by Montel Culver, RN Outcome: Progressing Goal: Diagnostic test results will improve 10/29/2021 1530 by Montel Culver, RN Outcome: Progressing 10/29/2021 1042 by Montel Culver, RN Outcome: Progressing Goal: Respiratory complications will improve 10/29/2021 1530 by Montel Culver, RN Outcome: Progressing 10/29/2021 1042 by Montel Culver, RN Outcome: Progressing Goal: Cardiovascular complication will be avoided 10/29/2021 1530 by Montel Culver, RN Outcome: Progressing 10/29/2021 1042 by Montel Culver, RN Outcome: Progressing   Problem: Activity: Goal: Risk for activity intolerance will decrease 10/29/2021 1530 by Montel Culver, RN Outcome: Progressing 10/29/2021 1042 by Montel Culver, RN Outcome: Progressing   Problem: Nutrition: Goal: Adequate nutrition will be maintained 10/29/2021 1530 by Montel Culver, RN Outcome: Progressing 10/29/2021 1042 by Montel Culver, RN Outcome: Progressing   Problem: Coping: Goal: Level  of anxiety will decrease 10/29/2021 1530 by Montel Culver, RN Outcome: Progressing 10/29/2021 1042 by Montel Culver, RN Outcome: Progressing   Problem: Elimination: Goal: Will not experience complications related to bowel motility 10/29/2021 1530 by Montel Culver, RN Outcome: Progressing 10/29/2021 1042 by Montel Culver, RN Outcome: Progressing Goal: Will not experience complications related to urinary retention 10/29/2021 1530 by Montel Culver, RN Outcome: Progressing 10/29/2021 1042 by Montel Culver, RN Outcome: Progressing   Problem: Pain Managment: Goal: General experience of comfort will improve 10/29/2021 1530 by Montel Culver, RN Outcome: Progressing 10/29/2021 1042 by Montel Culver, RN Outcome: Progressing   Problem: Safety: Goal: Ability to remain free from injury will improve 10/29/2021 1530 by Montel Culver, RN Outcome: Progressing 10/29/2021 1042 by Montel Culver, RN Outcome: Progressing   Problem: Skin Integrity: Goal: Risk for impaired skin integrity will decrease 10/29/2021 1530 by Montel Culver, RN Outcome: Progressing 10/29/2021 1042 by Montel Culver, RN Outcome: Progressing

## 2021-10-29 NOTE — TOC Progression Note (Signed)
Transition of Care [facility]) - Progression Note    Patient Details  Name: Laura David MRN: [id number] Date of Birth: [birth year]  Transition of Care [facility]) CM/SW Catarina, RN Phone Number: 10/29/2021, 9:43 AM  Clinical Narrative:    No bed offers at this time     Barriers to Discharge: No SNF bed, Insurance Authorization  Expected Discharge Plan and Services                                                 Social Determinants of Health (SDOH) Interventions Food Insecurity Interventions: Intervention Not Indicated Housing Interventions: Intervention Not Indicated Transportation Interventions: Intervention Not Indicated Utilities Interventions: Intervention Not Indicated  Readmission Risk Interventions    10/21/2021    9:01 AM 01/13/2020    4:33 PM  Readmission Risk Prevention Plan  Transportation Screening Complete Complete  PCP or Specialist Appt within 3-5 Days Complete   Medication Review (RN CM)  Complete  HRI or Home Care Consult Complete   Social Work Consult for [place] Planning/Counseling Complete   Palliative Care Screening Not Applicable   Medication Review Press photographer) Referral to Pharmacy

## 2021-10-29 NOTE — Progress Notes (Signed)
[facility] NAME: Laura David    MR#:  [id number]  DATE OF BIRTH:  12-17-1934  SUBJECTIVE:   No family at bedside. Patient is quite pleasant and cooperative.  VITALS:  Blood pressure (!) 146/86, pulse 72, temperature (!) 97.5 F (36.4 C), resp. rate 18, height 5' (1.524 m), weight 45.8 kg, SpO2 96 %.  PHYSICAL EXAMINATION:   GENERAL:  87 y.o.-year-old patient lying in the bed with no acute distress.  LUNGS: Normal breath sounds bilaterally, no wheezing CARDIOVASCULAR: S1, S2 normal. No murmurs,   ABDOMEN: Soft, nontender, nondistended. Bowel sounds present.  EXTREMITIES: No  edema b/l.    NEUROLOGIC: nonfocal  patient is alert  SKIN: No obvious rash, lesion, or ulcer.   LABORATORY PANEL:  CBC Recent Labs  Lab 10/29/21 0452  WBC 7.9  HGB 13.7  HCT 41.4  PLT 217       Assessment and Plan  Laura David is a 87 y.o. female with medical history significant of hypertension, PAF on warfarin, history of breast cancer, history of prior subdural hematoma, COPD/asthma, recurrent falls, known dementia, right shoulder pain secondary to rotator cuff tear and glenohumeral arthritis.   She was brought to the ER by her family due to behavioral changes, confusion worse than baseline as well as aggressive, paranoia and combative behaviors at home, and excessive sleep.  She has also had worsening weakness and recurrent falls.   Was recently hospitalized and discharged on 10/6 after being treated for UTI as well as right shoulder pain.     Recurrent falls --2/2 worsening weakness and not using her wheelchair. --PT/OT rec SNF rehab   Progressive dementia with recurrent behavioral issues related to paranoia and aggression --mental status much improved the day after presentation.  Has been calm and cooperative.    Chronic shoulder pain in context of rotator cuff tear and glenohumeral arthritis Patient reports Tylenol  ineffective --cont lidocaine patch (new)   Hypertension --cont amlodipine and Lopressor   PAF Coumadin per pharmacy--adjust dosing according to INR Continue beta-blocker   Dyslipidemia Continue Lipitor     DVT prophylaxis: RJ:JOACZYSA Code Status: DNR  Family Communication:  Level of care: Med-Surg Dispo:   The patient is from: home Anticipated d/c is to: SNF rehab Anticipated d/c date is: whenever bed available    TOTAL TIME TAKING CARE OF THIS PATIENT: 25 minutes.  >50% time spent on counselling and coordination of care  Note: This dictation was prepared with Dragon dictation along with smaller phrase technology. Any transcriptional errors that result from this process are unintentional.  Fritzi Mandes M.D    [facility]   CC: Primary care physician; [name], MD

## 2021-10-29 NOTE — Plan of Care (Signed)
  Problem: Education: Goal: Knowledge of General Education information will improve Description: Including pain rating scale, medication(s)/side effects and non-pharmacologic comfort measures 10/29/2021 1552 by Montel Culver, RN Outcome: Progressing 10/29/2021 1530 by Montel Culver, RN Outcome: Progressing 10/29/2021 1042 by Montel Culver, RN Outcome: Progressing   Problem: Health Behavior/Discharge Planning: Goal: Ability to manage health-related needs will improve 10/29/2021 1552 by Montel Culver, RN Outcome: Progressing 10/29/2021 1530 by Montel Culver, RN Outcome: Progressing 10/29/2021 1042 by Montel Culver, RN Outcome: Progressing   Problem: Clinical Measurements: Goal: Ability to maintain clinical measurements within normal limits will improve 10/29/2021 1552 by Montel Culver, RN Outcome: Progressing 10/29/2021 1530 by Montel Culver, RN Outcome: Progressing 10/29/2021 1042 by Montel Culver, RN Outcome: Progressing Goal: Will remain free from infection 10/29/2021 1552 by Montel Culver, RN Outcome: Progressing 10/29/2021 1530 by Montel Culver, RN Outcome: Progressing 10/29/2021 1042 by Montel Culver, RN Outcome: Progressing Goal: Diagnostic test results will improve 10/29/2021 1552 by Montel Culver, RN Outcome: Progressing 10/29/2021 1530 by Montel Culver, RN Outcome: Progressing 10/29/2021 1042 by Montel Culver, RN Outcome: Progressing Goal: Respiratory complications will improve 10/29/2021 1552 by Montel Culver, RN Outcome: Progressing 10/29/2021 1530 by Montel Culver, RN Outcome: Progressing 10/29/2021 1042 by Montel Culver, RN Outcome: Progressing Goal: Cardiovascular complication will be avoided 10/29/2021 1552 by Montel Culver, RN Outcome: Progressing 10/29/2021 1530 by Montel Culver, RN Outcome: Progressing 10/29/2021 1042 by Montel Culver,  RN Outcome: Progressing   Problem: Activity: Goal: Risk for activity intolerance will decrease 10/29/2021 1552 by Montel Culver, RN Outcome: Progressing 10/29/2021 1530 by Montel Culver, RN Outcome: Progressing 10/29/2021 1042 by Montel Culver, RN Outcome: Progressing   Problem: Nutrition: Goal: Adequate nutrition will be maintained 10/29/2021 1552 by Montel Culver, RN Outcome: Progressing 10/29/2021 1530 by Montel Culver, RN Outcome: Progressing 10/29/2021 1042 by Montel Culver, RN Outcome: Progressing   Problem: Coping: Goal: Level of anxiety will decrease 10/29/2021 1552 by Montel Culver, RN Outcome: Progressing 10/29/2021 1530 by Montel Culver, RN Outcome: Progressing 10/29/2021 1042 by Montel Culver, RN Outcome: Progressing   Problem: Elimination: Goal: Will not experience complications related to bowel motility 10/29/2021 1552 by Montel Culver, RN Outcome: Progressing 10/29/2021 1530 by Montel Culver, RN Outcome: Progressing 10/29/2021 1042 by Montel Culver, RN Outcome: Progressing Goal: Will not experience complications related to urinary retention 10/29/2021 1552 by Montel Culver, RN Outcome: Progressing 10/29/2021 1530 by Montel Culver, RN Outcome: Progressing 10/29/2021 1042 by Montel Culver, RN Outcome: Progressing   Problem: Pain Managment: Goal: General experience of comfort will improve 10/29/2021 1552 by Montel Culver, RN Outcome: Progressing 10/29/2021 1530 by Montel Culver, RN Outcome: Progressing 10/29/2021 1042 by Montel Culver, RN Outcome: Progressing   Problem: Safety: Goal: Ability to remain free from injury will improve 10/29/2021 1552 by Montel Culver, RN Outcome: Progressing 10/29/2021 1530 by Montel Culver, RN Outcome: Progressing 10/29/2021 1042 by Montel Culver, RN Outcome: Progressing   Problem: Skin Integrity: Goal:  Risk for impaired skin integrity will decrease 10/29/2021 1552 by Montel Culver, RN Outcome: Progressing 10/29/2021 1530 by Montel Culver, RN Outcome: Progressing 10/29/2021 1042 by Montel Culver, RN Outcome: Progressing

## 2021-10-29 NOTE — Progress Notes (Signed)
[place] for Warfarin Indication: atrial fibrillation  Patient Measurements: Height: 5' (152.4 cm) Weight: 45.8 kg (100 lb 15.5 oz) IBW/kg (Calculated) : 45.5  Labs: Recent Labs    10/27/21 0531 10/29/21 0452  HGB  --  13.7  HCT  --  41.4  PLT  --  217  LABPROT 35.3* 20.0*  INR 3.6* 1.7*     Estimated Creatinine Clearance: 36.9 mL/min (by C-G formula based on SCr of 0.8 mg/dL).   Medical History: Past Medical History:  Diagnosis Date   Asthma    Breast cancer ([place]) 2010   RIGHT   Cancer ([place])    basel cell, s/p MOH'S/DR. 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 Regarding paroxysmal Afib, patient's CHADSVASc is 5, HR stable and WNL, and INR is currently at goal 2-3.  Drug-drug interactions: None significant  Date INR Warfarin Dose 10/13 2.1  '1mg'$  10/14 1.8  '2mg'$     10/15 2.3  '2mg'$  10/16 2.7  1 mg 10/17 2.6  '1mg'$  10/18 2.4  '1mg'$  10/21 3.1  '1mg'$  10/22  3.6  Hold 10/23 N/A  Hold 10/24 1.7  '1mg'$    Goal of Therapy:  INR 2-3   Plan:  INR subtherapeutic today. Give warfarin '1mg'$  x1 Follow INR every 48-72 hours per request of attending physician; next due tomorrow CBC at least every 3 days per protocol; monitor for s/sx of bleeding in setting of elevated INR  Darrick Penna 10/29/2021 7:57 AM

## 2021-10-30 DIAGNOSIS — M25511 Pain in right shoulder: Secondary | ICD-10-CM

## 2021-10-30 DIAGNOSIS — R451 Restlessness and agitation: Secondary | ICD-10-CM | POA: Diagnosis not present

## 2021-10-30 DIAGNOSIS — F05 Delirium due to known physiological condition: Secondary | ICD-10-CM

## 2021-10-30 DIAGNOSIS — Z7901 Long term (current) use of anticoagulants: Secondary | ICD-10-CM

## 2021-10-30 DIAGNOSIS — I480 Paroxysmal atrial fibrillation: Secondary | ICD-10-CM

## 2021-10-30 DIAGNOSIS — Z7189 Other specified counseling: Secondary | ICD-10-CM

## 2021-10-30 DIAGNOSIS — R296 Repeated falls: Secondary | ICD-10-CM

## 2021-10-30 DIAGNOSIS — F03918 Unspecified dementia, unspecified severity, with other behavioral disturbance: Secondary | ICD-10-CM

## 2021-10-30 DIAGNOSIS — E860 Dehydration: Secondary | ICD-10-CM | POA: Diagnosis not present

## 2021-10-30 LAB — PROTIME-INR
INR: 1.3 — ABNORMAL HIGH (ref 0.8–1.2)
Prothrombin Time: 16.2 seconds — ABNORMAL HIGH (ref 11.4–15.2)

## 2021-10-30 MED ORDER — WARFARIN SODIUM 3 MG PO TABS
3.0000 mg | ORAL_TABLET | Freq: Once | ORAL | Status: AC
  Administered 2021-10-30: 3 mg via ORAL
  Filled 2021-10-30: qty 1

## 2021-10-30 MED ORDER — ORAL CARE MOUTH RINSE: 15.0000 mL | OROMUCOSAL | Status: DC | PRN

## 2021-10-30 NOTE — IPAL (Signed)
  Interdisciplinary Goals of Care Family Meeting   Date carried out: 10/30/2021  Location of the meeting: Phone conference  Member's involved: Physician and Family Member or next of kin  Durable Power of Attorney or acting medical decision maker: Son  Discussion: We discussed goals of care for Laura David   Code status: Full DNR  Disposition: Home with Hospice  Time spent for the meeting: 35 minutes    Max Sane, MD  10/30/2021, 4:10 PM

## 2021-10-30 NOTE — Assessment & Plan Note (Signed)
Hospice at home. 

## 2021-10-30 NOTE — Assessment & Plan Note (Signed)
With behavioral disturbances Likely progressive.  Son in agreement for hospice at home. 

## 2021-10-30 NOTE — Assessment & Plan Note (Signed)
Hospice planned at home

## 2021-10-30 NOTE — Progress Notes (Signed)
Occupational Therapy Treatment Patient Details Name: Laura David MRN: [id number] DOB: 1934-03-25 Today's Date: 10/30/2021   History of present illness Pt is an 87 y/o F admitted on 10/18/21 after being brought in by her family 2/2 confusion worse than baseline, aggressive & combative behaviors, recurrent falls & not eating/drinking well. Pt is being treated for dehydration, recurrent falls & progressive dementia with recurrent behavioral issues related to paranoia & aggression. PMH: HTN, dyslipidemia, PAF on warfarin, breast CA, SDH, COPD/asthma, recurrent falls, dementa, R rotator cuff tear & arthritis   OT comments  Laura David was seen for OT treatment on this date. Upon arrival to room pt reclined in bed, agreeable to tx. Pt requires significantly increased time and short shuffling steps noted for ~20 ft x 15 ft x 5 ft in room mobility. MIN A + RW for toilet t/f, poor tolerance, requires seated rest break and cues for steps. SBA hand washing standing sink side - dificulty reaching RUE above shoulder height 2/2 recent supraspinatus tear. Pt making progress toward goals, will continue to follow POC. Discharge recommendation remains appropriate.     Recommendations for follow up therapy are one component of a multi-disciplinary discharge planning process, led by the attending physician.  Recommendations may be updated based on patient status, additional functional criteria and insurance authorization.    Follow Up Recommendations  Skilled nursing-short term rehab (<3 hours/day)    Assistance Recommended at Discharge Frequent or constant Supervision/Assistance  Patient can return home with the following  Assistance with cooking/housework;Direct supervision/assist for medications management;Help with stairs or ramp for entrance;A little help with walking and/or transfers;A little help with bathing/dressing/bathroom   Equipment Recommendations  Other (comment) (defer)    Recommendations for  Other Services      Precautions / Restrictions Precautions Precautions: Fall Restrictions Weight Bearing Restrictions: No       Mobility Bed Mobility Overal bed mobility: Needs Assistance Bed Mobility: Sit to Supine     Supine to sit: Min assist, HOB elevated          Transfers Overall transfer level: Needs assistance Equipment used: Rolling walker (2 wheels) Transfers: Sit to/from Stand Sit to Stand: Min assist                 Balance Overall balance assessment: History of Falls, Needs assistance Sitting-balance support: Feet supported Sitting balance-Leahy Scale: Fair     Standing balance support: No upper extremity supported, During functional activity Standing balance-Leahy Scale: Fair                             ADL either performed or assessed with clinical judgement   ADL Overall ADL's : Needs assistance/impaired                                       General ADL Comments: MIN A + RW for toilet t/f, poor tolerance, requires seated rest break and cues for steps. SBA hand washing standing sink side - dificulty reaching RUE above shoulder height 2/2 recent supraspinatus tear.      Cognition Arousal/Alertness: Awake/alert Behavior During Therapy: WFL for tasks assessed/performed Overall Cognitive Status: History of cognitive impairments - at baseline  General Comments: repeated cues for safe RW use                   Pertinent Vitals/ Pain       Pain Assessment Pain Assessment: Faces Faces Pain Scale: Hurts a little bit Pain Location: BLE Pain Descriptors / Indicators: Discomfort, Moaning, Restless, Grimacing Pain Intervention(s): Limited activity within patient's tolerance, Repositioned   Frequency  Min 2X/week        Progress Toward Goals  OT Goals(current goals can now be found in the care plan section)  Progress towards OT goals: Progressing toward  goals  Acute Rehab OT Goals Patient Stated Goal: to avoid falls OT Goal Formulation: With patient/family Time For Goal Achievement: 11/13/21 Potential to Achieve Goals: Good ADL Goals Pt Will Perform Lower Body Bathing: with supervision Pt Will Perform Lower Body Dressing: with supervision Pt Will Transfer to Toilet: with modified independence Pt Will Perform Toileting - Clothing Manipulation and hygiene: with modified independence  Plan Discharge plan remains appropriate;Frequency remains appropriate    Co-evaluation                 AM-PAC OT "6 Clicks" Daily Activity     Outcome Measure   Help from another person eating meals?: None Help from another person taking care of personal grooming?: A Little Help from another person toileting, which includes using toliet, bedpan, or urinal?: A Little Help from another person bathing (including washing, rinsing, drying)?: A Little Help from another person to put on and taking off regular upper body clothing?: A Little Help from another person to put on and taking off regular lower body clothing?: A Little 6 Click Score: 19    End of Session    OT Visit Diagnosis: History of falling (Z91.81);Other abnormalities of gait and mobility (R26.89);Muscle weakness (generalized) (M62.81)   Activity Tolerance Patient tolerated treatment well;Patient limited by pain   Patient Left in chair;with call bell/phone within reach;with chair alarm set   Nurse Communication          Time: [phone] OT Time Calculation (min): 31 min  Charges: OT General Charges $OT Visit: 1 Visit OT Treatments $Self Care/Home Management : 23-37 mins  Laura David, M.S. OTR/L  10/30/21, 12:42 PM  ascom [phone]

## 2021-10-30 NOTE — Assessment & Plan Note (Signed)
Comfort care and hospice planned at home. 

## 2021-10-30 NOTE — Progress Notes (Signed)
Patient resting in bed, no complaints at this moment. Bed lowest position, bed alarm on, call bell in reach. Will continue to round on patient per unit routine and as needed.

## 2021-10-30 NOTE — Assessment & Plan Note (Signed)
Likely multifactorial.  Plan for hospice at home. 

## 2021-10-30 NOTE — Progress Notes (Signed)
[place] for Warfarin Indication: atrial fibrillation  Patient Measurements: Height: 5' (152.4 cm) Weight: 45.8 kg (100 lb 15.5 oz) IBW/kg (Calculated) : 45.5  Labs: Recent Labs    10/29/21 0452 10/29/21 0824  HGB 13.7  --   HCT 41.4  --   PLT 217  --   LABPROT 20.0* 17.7*  INR 1.7* 1.5*  CREATININE 0.76  --      Estimated Creatinine Clearance: 36.9 mL/min (by C-G formula based on SCr of 0.76 mg/dL).   Medical History: Past Medical History:  Diagnosis Date   Asthma    Breast cancer ([place]) 2010   RIGHT   Cancer ([place])    basel cell, s/p MOH'S/DR. 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 Regarding paroxysmal Afib, patient's CHADSVASc is 5, HR stable and WNL, and INR is currently at goal 2-3.  Drug-drug interactions: None significant  Date INR Warfarin Dose 10/13 2.1  '1mg'$  10/14 1.8  '2mg'$     10/15 2.3  '2mg'$  10/16 2.7  1 mg 10/17 2.6  '1mg'$  10/18 2.4  '1mg'$  10/21 3.1  '1mg'$  10/22  3.6  Hold 10/23 N/A  Hold 10/24 1.7  '1mg'$    Goal of Therapy:  INR 2-3   Plan:  INR subtherapeutic today. Give warfarin '3mg'$  x1 Next INR due tomorrow. Once INR's normalize, provider requests checking [phone] hours CBC at least every 3 days per protocol; monitor for s/sx of bleeding in setting of elevated INR  Darrick Penna 10/30/2021 7:48 AM

## 2021-10-30 NOTE — Progress Notes (Signed)
Physical Therapy Treatment Patient Details Name: Laura David MRN: [id number] DOB: 08/14/1934 Today's Date: 10/30/2021   History of Present Illness Pt is an 87 y/o F admitted on 10/18/21 after being brought in by her family 2/2 confusion worse than baseline, aggressive & combative behaviors, recurrent falls & not eating/drinking well. Pt is being treated for dehydration, recurrent falls & progressive dementia with recurrent behavioral issues related to paranoia & aggression. PMH: HTN, dyslipidemia, PAF on warfarin, breast CA, SDH, COPD/asthma, recurrent falls, dementa, R rotator cuff tear & arthritis    PT Comments    Pt received upright in recliner agreeable to PT. Endorses fatigue but willing to participate to her abilities displaying high motivation to improve. Pt stands minA to RW performing 2x24' bouts of gait. PT remains with significant safety deficits with RW far outside BOS with truncal flexion with small, shuffling steps with high risk of anterior LOB and falls. Pt requiring mod to max multimodal cuing with limited carryover. Is able to temporarily perform larger step lengths with cues leading to improved foot clearance and step lengths but similarly with other cues, limited carryover. Pt with seated rest b/t bouts then returning to supine minA at LE's. Pt remains at high falls risk for current gait requiring skilled care to reduce risk for falls with ambulation. Pt requires STR to improve safety and independence and reduced risk of falls.    Recommendations for follow up therapy are one component of a multi-disciplinary discharge planning process, led by the attending physician.  Recommendations may be updated based on patient status, additional functional criteria and insurance authorization.  Follow Up Recommendations  Skilled nursing-short term rehab (<3 hours/day) Can patient physically be transported by private vehicle: Yes   Assistance Recommended at Discharge Frequent or  constant Supervision/Assistance  Patient can return home with the following A little help with walking and/or transfers;A little help with bathing/dressing/bathroom;Assist for transportation;Help with stairs or ramp for entrance;Assistance with cooking/housework;Direct supervision/assist for financial management;Direct supervision/assist for medications management;Assistance with feeding   Equipment Recommendations  None recommended by PT    Recommendations for Other Services       Precautions / Restrictions Precautions Precautions: Fall Restrictions Weight Bearing Restrictions: No     Mobility  Bed Mobility Overal bed mobility: Needs Assistance Bed Mobility: Sit to Supine       Sit to supine: Min assist     Patient Response: Cooperative  Transfers Overall transfer level: Needs assistance Equipment used: Rolling walker (2 wheels) Transfers: Sit to/from Stand Sit to Stand: Min assist           General transfer comment: multimodal for hand placement and management of RW    Ambulation/Gait Ambulation/Gait assistance: Min guard Gait Distance (Feet): 48 Feet (seated rest b/t bouts) Assistive device: Rolling walker (2 wheels) Gait Pattern/deviations: Step-to pattern, Decreased step length - right, Decreased step length - left, Decreased dorsiflexion - right, Decreased dorsiflexion - left, Decreased stride length, Shuffle, Trunk flexed       General Gait Details: Maintains trunk flexed with RW far outside BOS. Very limited carryover in safe use of RW despite mod to max multimodal cuing   Stairs             Wheelchair Mobility    Modified Rankin (Stroke Patients Only)       Balance Overall balance assessment: Needs assistance Sitting-balance support: Feet supported Sitting balance-Leahy Scale: Fair     Standing balance support: No upper extremity supported, During functional activity Standing balance-Leahy  Scale: Fair                               Cognition Arousal/Alertness: Awake/alert Behavior During Therapy: WFL for tasks assessed/performed Overall Cognitive Status: History of cognitive impairments - at baseline                                 General Comments: appears fatigued and tired but in good spirits. Difficulty following multimodal cuing for safe mobility with LRAD.        Exercises      General Comments        Pertinent Vitals/Pain Pain Assessment Pain Assessment: No/denies pain    Home Living                          Prior Function            PT Goals (current goals can now be found in the care plan section) Acute Rehab PT Goals Patient Stated Goal: none stated PT Goal Formulation: With patient/family Time For Goal Achievement: 11/01/21 Potential to Achieve Goals: Fair Progress towards PT goals: Progressing toward goals    Frequency    Min 2X/week      PT Plan Current plan remains appropriate    Co-evaluation              AM-PAC PT "6 Clicks" Mobility   Outcome Measure  Help needed turning from your back to your side while in a flat bed without using bedrails?: A Little Help needed moving from lying on your back to sitting on the side of a flat bed without using bedrails?: A Little Help needed moving to and from a bed to a chair (including a wheelchair)?: A Little Help needed standing up from a chair using your arms (e.g., wheelchair or bedside chair)?: A Little Help needed to walk in hospital room?: A Lot Help needed climbing 3-5 steps with a railing? : Total 6 Click Score: 15    End of Session Equipment Utilized During Treatment: Gait belt Activity Tolerance: Patient tolerated treatment well Patient left: in bed;with call bell/phone within reach;with bed alarm set Nurse Communication: Mobility status PT Visit Diagnosis: History of falling (Z91.81);Muscle weakness (generalized) (M62.81);Other abnormalities of gait and mobility  (R26.89);Unsteadiness on feet (R26.81)     Time: 3545-6256 PT Time Calculation (min) (ACUTE ONLY): 23 min  Charges:  $Gait Training: 23-37 mins                    Salem Caster. Fairly IV, PT, DPT Physical Therapist- [facility]  10/30/2021, 2:55 PM

## 2021-10-30 NOTE — Plan of Care (Signed)

## 2021-10-30 NOTE — Assessment & Plan Note (Signed)
Continue current pain management. 

## 2021-10-30 NOTE — Progress Notes (Signed)
  Progress Note   Patient: Laura David ZOX:[id number] DOB: 1934/12/25 DOA: 10/18/2021     12 DOS: the patient was seen and examined on 10/30/2021   Brief hospital course: RIAH KEHOE is a 87 y.o. female with medical history significant of hypertension, PAF on warfarin, history of breast cancer, history of prior subdural hematoma, COPD/asthma, recurrent falls, known dementia, right shoulder pain secondary to rotator cuff tear and glenohumeral arthritis.   She was brought to the ER by her family due to behavioral changes, confusion worse than baseline as well as aggressive, paranoia and combative behaviors at home, and excessive sleep.  She has also had worsening weakness and recurrent falls.   Was recently hospitalized and discharged on 10/6 after being treated for UTI as well as right shoulder pain.    10/25: Hospice evaluation   Assessment and Plan: Senile dementia with delirium with behavioral disturbance ([place]) Hospice planned at home  PAF (paroxysmal atrial fibrillation) ([place]) Hospice at home  Goals of care, counseling/discussion Comfort care and hospice planned at home  Recurrent falls Likely multifactorial.  Plan for hospice at home  Right shoulder pain Continue current pain management  Dementia ([place]) With behavioral disturbances Likely progressive.  Son in agreement for hospice at home  Hypertension Comfort care and hospice planned at home        Subjective: Sitting in the chair complaining of shoulder pain.  Amenable for being comfortable/hospice at home but wants me to confirm with her son  Physical Exam: Vitals:   10/28/21 2117 10/30/21 0717 10/30/21 1030 10/30/21 1528  BP:  (!) 142/66 138/62 110/78  Pulse:  70 74 85  Resp:  '18 17 18  '$ Temp:  98.1 F (36.7 C) 97.6 F (36.4 C) 98.1 F (36.7 C)  TempSrc: Oral  Oral   SpO2:  99% 99% 99%  Weight:      Height:       87 year old female sitting in the chair comfortably without any acute  distress Lungs clear to auscultation bilaterally Cardiovascular regular rate and rhythm Abdomen soft, benign Neuro alert and awake, nonfocal Data Reviewed:  There are no new results to review at this time.  Family Communication: Updated son over phone  Disposition: Status is: Inpatient Remains inpatient appropriate because: Hospice evaluation and DME set up for home discharge   Planned Discharge Destination: Home with hospice    DVT prophylaxis.  On warfarin Time spent: 35 minutes  Author: Max Sane, MD 10/30/2021 4:14 PM  For on call review [email or web address].

## 2021-10-30 NOTE — Hospital Course (Signed)
Laura David is a 87 y.o. female with medical history significant of hypertension, PAF on warfarin, history of breast cancer, history of prior subdural hematoma, COPD/asthma, recurrent falls, known dementia, right shoulder pain secondary to rotator cuff tear and glenohumeral arthritis.   She was brought to the ER by her family due to behavioral changes, confusion worse than baseline as well as aggressive, paranoia and combative behaviors at home, and excessive sleep.  She has also had worsening weakness and recurrent falls.   Was recently hospitalized and discharged on 10/6 after being treated for UTI as well as right shoulder pain.    10/25: Hospice evaluation 10/26: Home with hospice tomorrow

## 2021-10-30 NOTE — Progress Notes (Signed)
[facility]) Hospital Liaison RN note  Received request from [facility] for hospice services at home after discharge. Chart and patient information under review by Hospice physician.   Spoke with patient's son Lennette Bihari to initiate education related to hospice philosophy, services, and team approach to care. Patient/family verbalized understanding of information given.   Per discussion, the plan is for discharge home likely tomorrow or the next day.  DME needs discussed. Patient has the following equipment in the home. Oxygen through [facility]. No immediate DME needs identified.   Please send signed and completed DNR with patient/family. Please provide symptoms at discharge as needed for ongoing symptom management.   [facility] information and contact numbers given to [facility]. Above information shared with TOC.   Please call with any hospice related questions or concerns.  Thank you for the opportunity to participate in this patient's care.  Jhonnie Garner, Therapist, sports, BSN [employer] [phone]

## 2021-10-30 NOTE — Plan of Care (Signed)
  Problem: Education: Goal: Knowledge of General Education information will improve Description: Including pain rating scale, medication(s)/side effects and non-pharmacologic comfort measures 10/30/2021 1608 by Kingsley Callander, RN Outcome: Progressing 10/30/2021 1031 by Kingsley Callander, RN Outcome: Progressing   Problem: Health Behavior/Discharge Planning: Goal: Ability to manage health-related needs will improve 10/30/2021 1608 by Catlett, Alcario Drought, RN Outcome: Progressing 10/30/2021 1031 by Kingsley Callander, RN Outcome: Progressing   Problem: Clinical Measurements: Goal: Ability to maintain clinical measurements within normal limits will improve 10/30/2021 1608 by Kingsley Callander, RN Outcome: Progressing 10/30/2021 1031 by Kingsley Callander, RN Outcome: Progressing Goal: Will remain free from infection 10/30/2021 1608 by Kingsley Callander, RN Outcome: Progressing 10/30/2021 1031 by Kingsley Callander, RN Outcome: Progressing Goal: Diagnostic test results will improve 10/30/2021 1608 by Kingsley Callander, RN Outcome: Progressing 10/30/2021 1031 by Kingsley Callander, RN Outcome: Progressing Goal: Respiratory complications will improve 10/30/2021 1608 by Kingsley Callander, RN Outcome: Progressing 10/30/2021 1031 by Kingsley Callander, RN Outcome: Progressing Goal: Cardiovascular complication will be avoided 10/30/2021 1608 by Kingsley Callander, RN Outcome: Progressing 10/30/2021 1031 by Kingsley Callander, RN Outcome: Progressing   Problem: Activity: Goal: Risk for activity intolerance will decrease 10/30/2021 1608 by Catlett, Alcario Drought, RN Outcome: Progressing 10/30/2021 1031 by Kingsley Callander, RN Outcome: Progressing   Problem: Nutrition: Goal: Adequate nutrition will be maintained 10/30/2021 1608 by Kingsley Callander, RN Outcome: Progressing 10/30/2021 1031 by Kingsley Callander, RN Outcome: Progressing   Problem: Coping: Goal: Level of anxiety will  decrease 10/30/2021 1608 by Kingsley Callander, RN Outcome: Progressing 10/30/2021 1031 by Kingsley Callander, RN Outcome: Progressing   Problem: Elimination: Goal: Will not experience complications related to bowel motility 10/30/2021 1608 by Kingsley Callander, RN Outcome: Progressing 10/30/2021 1031 by Kingsley Callander, RN Outcome: Progressing Goal: Will not experience complications related to urinary retention 10/30/2021 1608 by Kingsley Callander, RN Outcome: Progressing 10/30/2021 1031 by Kingsley Callander, RN Outcome: Progressing   Problem: Pain Managment: Goal: General experience of comfort will improve 10/30/2021 1608 by Kingsley Callander, RN Outcome: Progressing 10/30/2021 1031 by Kingsley Callander, RN Outcome: Progressing   Problem: Safety: Goal: Ability to remain free from injury will improve 10/30/2021 1608 by Kingsley Callander, RN Outcome: Progressing 10/30/2021 1031 by Kingsley Callander, RN Outcome: Progressing   Problem: Skin Integrity: Goal: Risk for impaired skin integrity will decrease 10/30/2021 1608 by Kingsley Callander, RN Outcome: Progressing 10/30/2021 1031 by Kingsley Callander, RN Outcome: Progressing

## 2021-10-31 DIAGNOSIS — R451 Restlessness and agitation: Secondary | ICD-10-CM | POA: Diagnosis not present

## 2021-10-31 DIAGNOSIS — F03918 Unspecified dementia, unspecified severity, with other behavioral disturbance: Secondary | ICD-10-CM | POA: Diagnosis not present

## 2021-10-31 DIAGNOSIS — E860 Dehydration: Secondary | ICD-10-CM | POA: Diagnosis not present

## 2021-10-31 DIAGNOSIS — I480 Paroxysmal atrial fibrillation: Secondary | ICD-10-CM | POA: Diagnosis not present

## 2021-10-31 MED ORDER — WARFARIN SODIUM 2 MG PO TABS
2.0000 mg | ORAL_TABLET | Freq: Once | ORAL | Status: AC
  Administered 2021-10-31: 2 mg via ORAL
  Filled 2021-10-31: qty 1

## 2021-10-31 NOTE — Assessment & Plan Note (Signed)
Hospice at home.

## 2021-10-31 NOTE — Assessment & Plan Note (Signed)
With behavioral disturbances Likely progressive.  Son in agreement for hospice at home.

## 2021-10-31 NOTE — Assessment & Plan Note (Signed)
Home with hospice tomorrow

## 2021-10-31 NOTE — Assessment & Plan Note (Signed)
Likely multifactorial.  Plan for hospice at home.

## 2021-10-31 NOTE — Progress Notes (Signed)
[facility]) Hospital Liaison Note:   Mrs. Mcneice was recently referred to [facility] for hospice at home.  Spoke with patient today, she was sitting in recliner, no concerns.  [facility] will continue to follow, and at discharge our services will be scheduled.    Please reach out with any concerns or questions.  Thank you, Kenna Gilbert BSN, RN  [phone]

## 2021-10-31 NOTE — Progress Notes (Addendum)
[place] for Warfarin Indication: atrial fibrillation  Patient Measurements: Height: 5' (152.4 cm) Weight: 45.8 kg (100 lb 15.5 oz) IBW/kg (Calculated) : 45.5  Labs: Recent Labs    10/29/21 0452 10/29/21 0824 10/30/21 1002  HGB 13.7  --   --   HCT 41.4  --   --   PLT 217  --   --   LABPROT 20.0* 17.7* 16.2*  INR 1.7* 1.5* 1.3*  CREATININE 0.76  --   --      Estimated Creatinine Clearance: 36.9 mL/min (by C-G formula based on SCr of 0.76 mg/dL).   Medical History: Past Medical History:  Diagnosis Date   Asthma    Breast cancer ([place]) 2010   RIGHT   Cancer ([place])    basel cell, s/p MOH'S/DR. 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 Regarding paroxysmal Afib, patient's CHADSVASc is 5, HR stable and WNL, and INR is currently at goal 2-3.  Drug-drug interactions: None significant  Date INR     Warfarin Dose 10/13 2.1  '1mg'$  10/14 1.8  '2mg'$     10/15 2.3  '2mg'$  10/16 2.7  1 mg 10/17 2.6  '1mg'$  10/18 2.4  '1mg'$  10/21 3.1  '1mg'$  10/22  3.6  Hold 10/23 N/A  Hold 10/24 1.5  '1mg'$  10/25 N/A  '3mg'$  10/26 1.3  '2mg'$  10/27 2.2  0.5 mg   Goal of Therapy:  INR 2-3   Plan:  INR subtherapeutic today. Give warfarin 0.5 mg x1 Given big jump in INR, will recheck INR tomorrow. Once INR's remain WNL, provider requests checking [phone] hours CBC at least every 3 days per protocol; monitor for s/sx of bleeding in setting of elevated INR  Darrick Penna 10/31/2021 9:27 AM

## 2021-10-31 NOTE — TOC Progression Note (Signed)
Transition of Care [facility]) - Progression Note    Patient Details  Name: Laura David MRN: [id number] Date of Birth: Apr 01, 1934  [facility]) CM/SW Contact  [name], RN Phone Number: 10/31/2021, 12:38 PM  Clinical Narrative:    Patient disposition change from SNF to hospice.      Barriers to Discharge: No SNF bed, Insurance Authorization  Expected Discharge Plan and Services                                                 Social Determinants of Health (SDOH) Interventions Food Insecurity Interventions: Intervention Not Indicated Housing Interventions: Intervention Not Indicated Transportation Interventions: Intervention Not Indicated Utilities Interventions: Intervention Not Indicated  Readmission Risk Interventions    10/21/2021    9:01 AM 01/13/2020    4:33 PM  Readmission Risk Prevention Plan  Transportation Screening Complete Complete  PCP or Specialist Appt within 3-5 Days Complete   Medication Review (RN CM)  Complete  HRI or Home Care Consult Complete   Social Work Consult for [place] Planning/Counseling Complete   Palliative Care Screening Not Applicable   Medication Review Press [employer]) Referral to Pharmacy

## 2021-10-31 NOTE — Assessment & Plan Note (Signed)
Continue current pain management.

## 2021-10-31 NOTE — Progress Notes (Signed)
  Progress Note   Patient: Laura David DOB: [birth year] DOA: 10/18/2021     13 DOS: the patient was seen and examined on 10/31/2021   Brief hospital course: Laura David is a 87 y.o. female with medical history significant of hypertension, PAF on warfarin, history of breast cancer, history of prior subdural hematoma, COPD/asthma, recurrent falls, known dementia, right shoulder pain secondary to rotator cuff tear and glenohumeral arthritis.   She was brought to the ER by her family due to behavioral changes, confusion worse than baseline as well as aggressive, paranoia and combative behaviors at home, and excessive sleep.  She has also had worsening weakness and recurrent falls.   Was recently hospitalized and discharged on 10/6 after being treated for UTI as well as right shoulder pain.    10/25: Hospice evaluation 10/26: Home with hospice tomorrow    Assessment and Plan: Senile dementia with delirium with behavioral disturbance ([place]) Home with hospice tomorrow  PAF (paroxysmal atrial fibrillation) ([place]) Hospice at home.  Goals of care, counseling/discussion Comfort care and hospice planned at home.  Recurrent falls Likely multifactorial.  Plan for hospice at home.  Right shoulder pain Continue current pain management.  Dementia ([place]) With behavioral disturbances Likely progressive.  Son in agreement for hospice at home.  Hypertension Comfort care and hospice planned at home.        Subjective: Sitting in the chair.  No new issues  Physical Exam: Vitals:   10/30/21 2144 10/30/21 2353 10/31/21 0737 10/31/21 1509  BP: 129/64 (!) 140/84 (!) 154/75 138/70  Pulse: 85 71 (!) 57 75  Resp:  '20 17 16  '$ Temp:  98.3 F (36.8 C) 97.7 F (36.5 C) 98.7 F (37.1 C)  TempSrc:      SpO2: 99% 98% 99% 97%  Weight:      Height:       87 year old female sitting in the chair comfortably without any acute distress Lungs clear to auscultation  bilaterally Cardiovascular regular rate and rhythm Abdomen soft, benign Neuro alert and awake, nonfocal Data Reviewed:  There are no new results to review at this time.  Family Communication: Son updated over phone  Disposition: Status is: Inpatient Remains inpatient appropriate because: Plan for hospice at home tomorrow   Planned Discharge Destination: Home with hospice    DVT prophylaxis-Coumadin Time spent: 35 minutes  Author: Max Sane, MD 10/31/2021 4:27 PM  For on call review [email or web address].

## 2021-10-31 NOTE — Care Management Important Message (Signed)
Important Message  Patient Details  Name: Laura David MRN: [id number] Date of Birth: [birth year]   Medicare Important Message Given:  Other (see comment)  Disposition now to discharge with hospice services.  Medicare IM withheld at this time out of respect for patient family.    [name] 10/31/2021, 12:51 PM

## 2021-10-31 NOTE — Plan of Care (Signed)
  Problem: Education: Goal: Knowledge of General Education information will improve Description: Including pain rating scale, medication(s)/side effects and non-pharmacologic comfort measures 10/31/2021 0746 by Celso Amy, RN Outcome: Progressing 10/31/2021 0746 by Celso Amy, RN Outcome: Progressing   Problem: Health Behavior/Discharge Planning: Goal: Ability to manage health-related needs will improve 10/31/2021 0746 by Celso Amy, RN Outcome: Progressing 10/31/2021 0746 by Celso Amy, RN Outcome: Progressing   Problem: Activity: Goal: Risk for activity intolerance will decrease Outcome: Progressing

## 2021-10-31 NOTE — Plan of Care (Signed)

## 2021-10-31 NOTE — Assessment & Plan Note (Signed)
Comfort care and hospice planned at home.

## 2021-10-31 NOTE — Progress Notes (Signed)
PT Cancellation Note  Patient Details Name: Laura David MRN: [id number] DOB: 1934/11/07   Cancelled Treatment:    Reason Eval/Treat Not Completed: Patient declined, no reason specified.  Chart reviewed (per chart plan for pt to discharge home with hospice services).  Discussed pt's status with nursing who reports pt cleared for therapy activity and reports pt currently up in chair.  Pt drinking tea upon PT arrival (sitting in chair) and pt politely declining therapy at this time (although pt talking about wanting to move more; pt also reporting being eager to discharge home).  Will monitor pt's status and re-attempt PT session at a later date/time if appropriate.  Leitha Bleak, PT 10/31/21, 2:44 PM

## 2021-11-01 LAB — PROTIME-INR
INR: 2.2 — ABNORMAL HIGH (ref 0.8–1.2)
Prothrombin Time: 24 seconds — ABNORMAL HIGH (ref 11.4–15.2)

## 2021-11-01 MED ORDER — MORPHINE SULFATE (CONCENTRATE) 20 MG/ML PO SOLN: 10.0000 mg | ORAL | 0 refills | Status: AC | PRN

## 2021-11-01 MED ORDER — RISPERIDONE 0.25 MG PO TABS: 0.2500 mg | ORAL_TABLET | Freq: Two times a day (BID) | ORAL | 0 refills | Status: AC | PRN

## 2021-11-01 MED ORDER — WARFARIN 0.5 MG HALF TABLET
0.5000 mg | ORAL_TABLET | Freq: Once | ORAL | Status: DC
  Filled 2021-11-01: qty 1

## 2021-11-01 NOTE — Progress Notes (Signed)
[facility]) Hospital Liaison Note:   Mrs. Kerchner to discharge home today with son via POV.  No DME needs identified.  Admission visit set for 2 pm this afternoon.    Please send signed and completed DNR.    Thank you for allowing [place] to care for this patient.  Kenna Gilbert BSN, RN  [facility] Liaison  [phone]

## 2021-11-01 NOTE — Plan of Care (Signed)
  Problem: Clinical Measurements: Goal: Ability to maintain clinical measurements within normal limits will improve Outcome: Progressing   Problem: Activity: Goal: Risk for activity intolerance will decrease Outcome: Progressing   Problem: Pain Managment: Goal: General experience of comfort will improve Outcome: Progressing   Problem: Safety: Goal: Ability to remain free from injury will improve Outcome: Progressing   

## 2021-11-01 NOTE — TOC Transition Note (Signed)
Transition of Care [facility]) - CM/SW Discharge Note   Patient Details  Name: AMELYA MABRY MRN: [id number] Date of Birth: May 31, 1934  [facility]) CM/SW Contact:  Magnus Ivan, LCSW Phone Number: 11/01/2021, 9:26 AM   Clinical Narrative:    Patient is discharging home with [facility] services today. Olivia Mackie with [facility] is aware.    Final next level of care: Home w Hospice Care Barriers to Discharge: Barriers Resolved   Patient Goals and CMS Choice   CMS Medicare.gov Compare Post Acute Care list provided to:: Patient Represenative (must comment) (Son Lennette Bihari [facility])) Choice offered to / list presented to : Adult Children  Discharge Placement                Patient to be transferred to facility by: son      Discharge Plan and Services                                     Social Determinants of Health (SDOH) Interventions Food Insecurity Interventions: Intervention Not Indicated Housing Interventions: Intervention Not Indicated Transportation Interventions: Intervention Not Indicated Utilities Interventions: Intervention Not Indicated   Readmission Risk Interventions    10/21/2021    9:01 AM 01/13/2020    4:33 PM  Readmission Risk Prevention Plan  Transportation Screening Complete Complete  PCP or Specialist Appt within 3-5 Days Complete   Medication Review (RN CM)  Complete  HRI or Home Care Consult Complete   Social Work Consult for [place] Planning/Counseling Complete   Palliative Care Screening Not Applicable   Medication Review Press photographer) Referral to Pharmacy

## 2021-11-01 NOTE — Progress Notes (Addendum)
Laura David with son Laura David, he is coming to pick pt up around 12pm to take her home with hospice  1200 D/C AVS completed and reviewed with pt. All opportunities for questions answered and clarified. IV removed. Pt dressed.  will be wheeled down to car at medical mall entrance via wheelchair.

## 2021-11-01 NOTE — Plan of Care (Signed)

## 2021-11-02 NOTE — Discharge Summary (Signed)
Physician Discharge Summary   Patient: Laura David MRN: [id number] DOB: 11-13-1934  Admit date:     10/18/2021  Discharge date: 11/01/2021  Discharge Physician: Max Sane   PCP: [name], MD   Recommendations at discharge:   Hospice at home  Discharge Diagnoses: Principal Problem:   Dehydration, moderate Active Problems:   Senile dementia with delirium with behavioral disturbance (HCC)   Agitation   PAF (paroxysmal atrial fibrillation) (HCC)   Hypertension   Hyperlipidemia   Leucocytosis   Anticoagulated on Coumadin   Dementia (HCC)   Right shoulder pain   Recurrent falls   Goals of care, counseling/discussion  Hospital Course: DEMESHIA David is a 87 y.o. female with medical history significant of hypertension, PAF on warfarin, history of breast cancer, history of prior subdural hematoma, COPD/asthma, recurrent falls, known dementia, right shoulder pain secondary to rotator cuff tear and glenohumeral arthritis.   She was brought to the ER by her family due to behavioral changes, confusion worse than baseline as well as aggressive, paranoia and combative behaviors at home, and excessive sleep.  She has also had worsening weakness and recurrent falls.   Was recently hospitalized and discharged on 10/6 after being treated for UTI as well as right shoulder pain.    10/25: Hospice evaluation 10/26: Home with hospice tomorrow   Assessment and Plan: Senile dementia with delirium with behavioral disturbance (HCC) PAF (paroxysmal atrial fibrillation) (HCC) Goals of care, counseling/discussion Recurrent falls Right shoulder pain Dementia ([place]) Hypertension  Plan for hospice at home upon discharge         Disposition: Hospice care Diet recommendation:  Discharge Diet Orders (From admission, onward)     Start     Ordered   11/01/21 0000  Diet - low sodium heart healthy        11/01/21 0808           Carb modified diet DISCHARGE  MEDICATION: Allergies as of 11/01/2021       Reactions   Flagyl [metronidazole] Nausea Only   Pacerone [amiodarone]    Eye deposits   Ramipril Cough        Medication List     TAKE these medications    alendronate 70 MG tablet Commonly known as: FOSAMAX Take 70 mg by mouth once a week. (Sundays)   amLODipine 2.5 MG tablet Commonly known as: NORVASC Take 2.5 mg by mouth daily.   atorvastatin 10 MG tablet Commonly known as: LIPITOR Take 10 mg by mouth at bedtime.   memantine 10 MG tablet Commonly known as: NAMENDA Take 10 mg by mouth in the morning and at bedtime.   metoprolol tartrate 25 MG tablet Commonly known as: LOPRESSOR Take 25 mg by mouth 2 (two) times daily.   mirtazapine 7.5 MG tablet Commonly known as: REMERON Take 7.5 mg by mouth at bedtime.   morphine 20 MG/ML concentrated solution Commonly known as: ROXANOL Take 0.5 mLs (10 mg total) by mouth every 4 (four) hours as needed for severe pain, breakthrough pain, shortness of breath, anxiety or moderate pain. May give sublingually if needed.   risperiDONE 0.25 MG tablet Commonly known as: RISPERDAL Take 1 tablet (0.25 mg total) by mouth 2 (two) times daily as needed (agitation).   warfarin 2 MG tablet Commonly known as: COUMADIN Take 2 mg by mouth as directed. TAKE 1 TABLET BY MOUTH DAILY ON SUNDAY AND WEDNESDAY, AND 1/2 TABLET DAILY ALL OTHER DAYS        Follow-up Information  [name], MD. Schedule an appointment as soon as possible for a visit in 1 week(s).   Specialty: Family Medicine Why: [facility] Discharge F/UP Contact information: [phone] [phone]                Discharge Exam: Filed Weights   10/17/21 2255 10/19/21 2059  Weight: 48.1 kg 23.35 kg   87 year old female sitting in the chair comfortably without any acute distress Lungs clear to auscultation bilaterally Cardiovascular regular rate and rhythm Abdomen soft,  benign Neuro alert and awake, nonfocal  Condition at discharge: fair  The results of significant diagnostics from this hospitalization (including imaging, microbiology, ancillary and laboratory) are listed below for reference.   Imaging Studies: DG Chest Port 1 View  Result Date: 10/18/2021 CLINICAL DATA:  Leukocytosis.  Altered mental status. EXAM: PORTABLE CHEST 1 VIEW COMPARISON:  One-view chest x-ray 01/09/2020 FINDINGS: The heart is enlarged. Atherosclerotic calcifications are present at the aortic arch. Changes of COPD are again noted. No edema or effusion is present. No focal airspace disease is present. Advanced degenerative changes are again noted within the right shoulder. IMPRESSION: 1. Cardiomegaly without failure. 2. Stable COPD. 3. No acute cardiopulmonary disease. Electronically Signed   By: San Morelle M.D.   On: 10/18/2021 07:26   DG Elbow Complete Left  Result Date: 10/18/2021 CLINICAL DATA:  Pain after a fall EXAM: LEFT ELBOW - COMPLETE 3+ VIEW COMPARISON:  01/09/2020 FINDINGS: There is no evidence of fracture, dislocation, or joint effusion. There is no evidence of arthropathy or other focal bone abnormality. Soft tissues are unremarkable. IMPRESSION: Negative. Electronically Signed   By: Lucienne Capers M.D.   On: 10/18/2021 00:30   DG Elbow Complete Right  Result Date: 10/18/2021 CLINICAL DATA:  Pain after a fall EXAM: RIGHT ELBOW - COMPLETE 3+ VIEW COMPARISON:  None Available. FINDINGS: There is no evidence of fracture, dislocation, or joint effusion. There is no evidence of arthropathy or other focal bone abnormality. Soft tissues are unremarkable. IMPRESSION: Negative. Electronically Signed   By: Lucienne Capers M.D.   On: 10/18/2021 00:29   DG Femur Min 2 Views Right  Result Date: 10/18/2021 CLINICAL DATA:  Pain after a fall EXAM: RIGHT FEMUR 2 VIEWS COMPARISON:  None Available. FINDINGS: Mild degenerative changes in the right knee and right hip. No  evidence of acute fracture or dislocation of the femur. No focal bone lesion or bone destruction. Bone cortex appears intact. Vascular calcifications. IMPRESSION: Mild degenerative changes.  No acute bony abnormalities. Electronically Signed   By: Lucienne Capers M.D.   On: 10/18/2021 00:29   DG Femur Min 2 Views Left  Result Date: 10/18/2021 CLINICAL DATA:  Pain after a fall EXAM: LEFT FEMUR 2 VIEWS COMPARISON:  None Available. FINDINGS: Degenerative changes in the left hip and left knee joints. No evidence of acute fracture or dislocation of the femur. No focal bone lesion or bone destruction. Prominent vascular calcifications. IMPRESSION: Degenerative changes.  No acute bony abnormalities. Electronically Signed   By: Lucienne Capers M.D.   On: 10/18/2021 00:28   DG Lumbar Spine 2-3 Views  Result Date: 10/18/2021 CLINICAL DATA:  Pain after a fall EXAM: LUMBAR SPINE - 2-3 VIEW COMPARISON:  None Available. FINDINGS: Five lumbar type vertebral bodies. Normal alignment. No vertebral compression deformities. Degenerative changes throughout with narrowed interspaces and endplate osteophyte formation. Degenerative changes in the lower lumbar facet joints. No focal bone lesions. Visualized sacrum appears intact. Vascular calcifications. IMPRESSION: Degenerative  changes in the lumbar spine. Normal alignment. No acute displaced fractures identified. Electronically Signed   By: Lucienne Capers M.D.   On: 10/18/2021 00:28   DG Pelvis 1-2 Views  Result Date: 10/18/2021 CLINICAL DATA:  Pain after a fall. EXAM: PELVIS - 1-2 VIEW COMPARISON:  Pelvis 01/09/2020.  Sacrum none 08/25/2021 FINDINGS: Degenerative changes in the lower lumbar spine and hips. No acute fractures identified. SI joints and symphysis pubis are not displaced. No focal bone lesions. IMPRESSION: No acute bony abnormalities.  Mild degenerative changes. Electronically Signed   By: Lucienne Capers M.D.   On: 10/18/2021 00:26   CT Cervical Spine  Wo Contrast  Result Date: 10/18/2021 CLINICAL DATA:  Poly trauma blunt. Fall. Increasing confusion and combative with fevers. EXAM: CT CERVICAL SPINE WITHOUT CONTRAST TECHNIQUE: Multidetector CT imaging of the cervical spine was performed without intravenous contrast. Multiplanar CT image reconstructions were also generated. RADIATION DOSE REDUCTION: This exam was performed according to the departmental dose-optimization program which includes automated exposure control, adjustment of the mA and/or kV according to patient size and/or use of iterative reconstruction technique. COMPARISON:  01/09/2020 FINDINGS: Alignment: No change in alignment since prior study with straightening of usual cervical lordosis and slight anterior subluxation at C4-5. Normal alignment of the facet joints. C1-2 articulation appears intact. Skull base and vertebrae: No acute fracture. No primary bone lesion or focal pathologic process. Soft tissues and spinal canal: No prevertebral fluid or swelling. No visible canal hematoma. Disc levels: Degenerative changes with disc space narrowing and osteophyte formation most prominent at C5-6. No change. Upper chest: Mild apical scarring in the lungs. Other: None. IMPRESSION: Alignment of the cervical spine is unchanged since prior study. Degenerative changes at C5-6. No acute displaced fractures are identified. Electronically Signed   By: Lucienne Capers M.D.   On: 10/18/2021 00:01   CT Head Wo Contrast  Result Date: 10/17/2021 CLINICAL DATA:  Poly trauma due to a fall. Increasingly confused and combative. Fevers. EXAM: CT HEAD WITHOUT CONTRAST TECHNIQUE: Contiguous axial images were obtained from the base of the skull through the vertex without intravenous contrast. RADIATION DOSE REDUCTION: This exam was performed according to the departmental dose-optimization program which includes automated exposure control, adjustment of the mA and/or kV according to patient size and/or use of  iterative reconstruction technique. COMPARISON:  10/03/2021 FINDINGS: Brain: Diffuse cerebral atrophy. Ventricular dilatation consistent with central atrophy. Low-attenuation changes in the deep white matter consistent with small vessel ischemia. No abnormal extra-axial fluid collections. No mass effect or midline shift. Gray-white matter junctions are distinct. Basal cisterns are not effaced. No acute intracranial hemorrhage. Vascular: Intracranial arterial calcifications. Skull: Calvarium appears intact. Sinuses/Orbits: Paranasal sinuses and mastoid air cells are clear. Other: None. IMPRESSION: No acute intracranial abnormalities. Chronic atrophy and small vessel ischemic changes. Electronically Signed   By: Lucienne Capers M.D.   On: 10/17/2021 23:56   MR SHOULDER RIGHT WO CONTRAST  Result Date: 10/07/2021 CLINICAL DATA:  Shoulder pain.  Stress fracture suspected. EXAM: MRI OF THE RIGHT SHOULDER WITHOUT CONTRAST TECHNIQUE: Multiplanar, multisequence MR imaging of the shoulder was performed. No intravenous contrast was administered. COMPARISON:  Right shoulder radiographs 10/03/2021 FINDINGS: Despite efforts by the technologist and patient, motion artifact is present on today's exam and could not be eliminated. This reduces exam sensitivity and specificity. Rotator cuff: There is a full-thickness tear of the anterior supraspinatus tendon footprint measuring up to 16 mm in AP dimension (sagittal series 8, image 8) and with up to 11 mm  tendon retraction. There is mild-to-moderate subcortical cystic change focally within the humeral head deep to this tendon tear. Mild posterior supraspinatus and anterior infraspinatus intermediate T2 signal tendinosis. Punctate fluid bright superior subscapularis tendon partial-thickness tear. The teres minor is intact. Muscles: Severe supraspinatus and moderate infraspinatus muscle atrophy. Biceps long head: Mild proximal long head of the biceps intermediate T2 signal  tendinosis. Acromioclavicular Joint: There are mild degenerative changes of the acromioclavicular joint including joint space narrowing, subchondral marrow edema, and peripheral osteophytosis. Type II acromion. Mild fluid within the subacromial/subdeltoid bursa. Glenohumeral Joint: Mild glenohumeral joint effusion. Severe glenohumeral osteoarthritis including full-thickness cartilage loss diffusely, mild high-grade glenoid fossa and humeral head cortical flattening/remodeling, an anterior glenoid degenerative subchondral cyst measuring up to 2.0 cm in craniocaudal dimension and 1.3 x 1.1 cm in transverse dimension (sagittal image 17 and axial image 12), large posterior glenoid degenerative osteophytes (which may be chronically separated from the glenoid rim), and a large inferior humeral head-neck junction degenerative osteophytes. Mild anterior inferior humeral head subchondral marrow edema. Labrum: The anterior glenoid labrum is not visualized. High-grade degenerative tearing of the superior glenoid labrum. Bones:  No acute fracture. Other: None. IMPRESSION: 1. Full-thickness tear of the anterior supraspinatus tendon footprint measuring up to 16 mm in AP dimension. Severe supraspinatus and moderate infraspinatus muscle atrophy. 2. Punctate partial-thickness tear of the distal superior subscapularis tendon. 3. Mild proximal long head of the biceps tendinosis. 4. Mild degenerative changes of the acromioclavicular joint. 5. Severe glenohumeral osteoarthritis. 6. No acute fracture is seen. Electronically Signed   By: Yvonne Kendall M.D.   On: 10/07/2021 16:09   DG Sacrum/Coccyx  Result Date: 10/03/2021 CLINICAL DATA:  Coccyx pain after fall EXAM: SACRUM AND COCCYX - 2+ VIEW COMPARISON:  Radiograph 01/09/2020 FINDINGS: There is no evidence of fracture or other focal bone lesions. Demineralization. IMPRESSION: Negative. Electronically Signed   By: Placido Sou M.D.   On: 10/03/2021 20:56   DG Shoulder  Right  Result Date: 10/03/2021 CLINICAL DATA:  Right shoulder pain after fall EXAM: RIGHT SHOULDER - 2+ VIEW COMPARISON:  Radiographs 01/09/2020 FINDINGS: No acute fracture or dislocation. Advanced hypertrophic degenerative arthritis in the right shoulder. IMPRESSION: No acute fracture or dislocation. Advanced right shoulder arthritis. Electronically Signed   By: Placido Sou M.D.   On: 10/03/2021 20:54   CT Head Wo Contrast  Result Date: 10/03/2021 CLINICAL DATA:  Head trauma, intracranial arterial injury suspected fall. Fall EXAM: CT HEAD WITHOUT CONTRAST TECHNIQUE: Contiguous axial images were obtained from the base of the skull through the vertex without intravenous contrast. RADIATION DOSE REDUCTION: This exam was performed according to the departmental dose-optimization program which includes automated exposure control, adjustment of the mA and/or kV according to patient size and/or use of iterative reconstruction technique. COMPARISON:  08/13/2021 FINDINGS: Brain: There is atrophy and chronic small vessel disease changes. No acute intracranial abnormality. Specifically, no hemorrhage, hydrocephalus, mass lesion, acute infarction, or significant intracranial injury. Vascular: No hyperdense vessel or unexpected calcification. Skull: No acute calvarial abnormality. Sinuses/Orbits: No acute findings Other: None IMPRESSION: Atrophy, chronic microvascular disease. No acute intracranial abnormality. Electronically Signed   By: Rolm Baptise M.D.   On: 10/03/2021 20:35    Microbiology: Results for orders placed or performed during the hospital encounter of 10/18/21  Urine Culture     Status: Abnormal   Collection Time: 10/17/21 10:56 PM   Specimen: Urine, Clean Catch  Result Value Ref Range Status   Specimen Description   Final  URINE, CLEAN CATCH Performed at [facility], [address] [place], [place] [address]    Special Requests   Final    NONE Performed at [facility], [address] [place], [place] [address]    Culture (A)  Final    10,000 COLONIES/mL MULTIPLE SPECIES PRESENT, SUGGEST RECOLLECTION   Report Status 10/19/2021 FINAL  Final  Resp Panel by RT-PCR (Flu A&B, Covid) Anterior Nasal Swab     Status: None   Collection Time: 10/18/21  1:22 AM   Specimen: Anterior Nasal Swab  Result Value Ref Range Status   SARS Coronavirus 2 by RT PCR NEGATIVE NEGATIVE Final    Comment: (NOTE) SARS-CoV-2 target nucleic acids are NOT DETECTED.  The SARS-CoV-2 RNA is generally detectable in upper respiratory specimens during the acute phase of infection. The lowest concentration of SARS-CoV-2 viral copies this assay can detect is 138 copies/mL. A negative result does not preclude SARS-Cov-2 infection and should not be used as the sole basis for treatment or other patient management decisions. A negative result may occur with  improper specimen collection/handling, submission of specimen other than nasopharyngeal swab, presence of viral mutation(s) within the areas targeted by this assay, and inadequate number of viral copies(<138 copies/mL). A negative result must be combined with clinical observations, patient history, and epidemiological information. The expected result is Negative.  Fact Sheet for Patients:  EntrepreneurPulse.com.au  Fact Sheet for Healthcare Providers:  IncredibleEmployment.be  This test is no t yet approved or cleared by the [place] FDA and  has been authorized for detection and/or diagnosis of SARS-CoV-2 by FDA under an Emergency Use Authorization (EUA). This EUA will remain  in effect (meaning this test can be used) for the duration of the COVID-19 declaration under Section 564(b)(1) of the Act, 21 U.S.C.section 360bbb-3(b)(1), unless the authorization is terminated  or revoked sooner.       Influenza A by PCR NEGATIVE NEGATIVE Final   Influenza B by PCR NEGATIVE NEGATIVE Final     Comment: (NOTE) The Xpert Xpress SARS-CoV-2/FLU/RSV plus assay is intended as an aid in the diagnosis of influenza from Nasopharyngeal swab specimens and should not be used as a sole basis for treatment. Nasal washings and aspirates are unacceptable for Xpert Xpress SARS-CoV-2/FLU/RSV testing.  Fact Sheet for Patients: EntrepreneurPulse.com.au  Fact Sheet for Healthcare Providers: IncredibleEmployment.be  This test is not yet approved or cleared by the [place] FDA and has been authorized for detection and/or diagnosis of SARS-CoV-2 by FDA under an Emergency Use Authorization (EUA). This EUA will remain in effect (meaning this test can be used) for the duration of the COVID-19 declaration under Section 564(b)(1) of the Act, 21 U.S.C. section 360bbb-3(b)(1), unless the authorization is terminated or revoked.  Performed at [facility], [address] [place], [place] [address]     Labs: CBC: Recent Labs  Lab 10/29/21 0452  WBC 7.9  HGB 13.7  HCT 41.4  MCV 92.4  PLT 381   Basic Metabolic Panel: Recent Labs  Lab 10/29/21 0452  NA 139  K 3.7  CL 105  CO2 27  GLUCOSE 88  BUN 24*  CREATININE 0.76  CALCIUM 9.3   Liver Function Tests: No results for input(s): "AST", "ALT", "ALKPHOS", "BILITOT", "PROT", "ALBUMIN" in the last 168 hours. CBG: Recent Labs  Lab 10/28/21 2116  GLUCAP 116*    Discharge time spent: greater than 30 minutes.  Signed: Max Sane, MD [facility] 11/02/2021

## 2022-01-06 DEATH — deceased
